# Patient Record
Sex: Male | Born: 1970 | Race: White | Hispanic: No | Marital: Married | Smoking: Current every day smoker
Health system: Southern US, Community
[De-identification: ages and names within clinical notes are randomized; demographics above are authoritative.]

## PROBLEM LIST (undated history)

## (undated) DIAGNOSIS — F1911 Other psychoactive substance abuse, in remission: Secondary | ICD-10-CM

## (undated) DIAGNOSIS — K219 Gastro-esophageal reflux disease without esophagitis: Secondary | ICD-10-CM

## (undated) DIAGNOSIS — I10 Essential (primary) hypertension: Secondary | ICD-10-CM

## (undated) DIAGNOSIS — F329 Major depressive disorder, single episode, unspecified: Secondary | ICD-10-CM

## (undated) DIAGNOSIS — B20 Human immunodeficiency virus [HIV] disease: Secondary | ICD-10-CM

## (undated) DIAGNOSIS — F32A Depression, unspecified: Secondary | ICD-10-CM

## (undated) DIAGNOSIS — Z21 Asymptomatic human immunodeficiency virus [HIV] infection status: Secondary | ICD-10-CM

## (undated) DIAGNOSIS — F319 Bipolar disorder, unspecified: Secondary | ICD-10-CM

## (undated) HISTORY — DX: Other psychoactive substance abuse, in remission: F19.11

## (undated) HISTORY — PX: FINGER SURGERY: SHX640

---

## 2004-11-26 ENCOUNTER — Ambulatory Visit: Payer: Self-pay | Admitting: Family Medicine

## 2004-12-13 ENCOUNTER — Ambulatory Visit: Payer: Self-pay | Admitting: Family Medicine

## 2005-08-01 ENCOUNTER — Ambulatory Visit: Payer: Self-pay | Admitting: Family Medicine

## 2005-08-21 ENCOUNTER — Ambulatory Visit: Payer: Self-pay | Admitting: Family Medicine

## 2005-12-16 ENCOUNTER — Ambulatory Visit: Payer: Self-pay | Admitting: Family Medicine

## 2006-01-08 ENCOUNTER — Ambulatory Visit: Payer: Self-pay | Admitting: Family Medicine

## 2006-09-18 ENCOUNTER — Ambulatory Visit: Payer: Self-pay | Admitting: Family Medicine

## 2006-12-26 ENCOUNTER — Ambulatory Visit: Payer: Self-pay | Admitting: Family Medicine

## 2012-01-03 ENCOUNTER — Encounter (HOSPITAL_COMMUNITY): Payer: Self-pay | Admitting: *Deleted

## 2012-01-03 ENCOUNTER — Inpatient Hospital Stay (HOSPITAL_COMMUNITY): Payer: Self-pay

## 2012-01-03 ENCOUNTER — Other Ambulatory Visit: Payer: Self-pay

## 2012-01-03 ENCOUNTER — Emergency Department (HOSPITAL_COMMUNITY): Payer: Self-pay

## 2012-01-03 ENCOUNTER — Inpatient Hospital Stay (HOSPITAL_COMMUNITY)
Admission: EM | Admit: 2012-01-03 | Discharge: 2012-01-05 | DRG: 683 | Disposition: A | Discharge status: Home / Self Care | Payer: Self-pay | Attending: Internal Medicine | Admitting: Internal Medicine

## 2012-01-03 DIAGNOSIS — I959 Hypotension, unspecified: Secondary | ICD-10-CM | POA: Diagnosis present

## 2012-01-03 DIAGNOSIS — E876 Hypokalemia: Secondary | ICD-10-CM | POA: Diagnosis present

## 2012-01-03 DIAGNOSIS — G2579 Other drug induced movement disorders: Secondary | ICD-10-CM

## 2012-01-03 DIAGNOSIS — F319 Bipolar disorder, unspecified: Secondary | ICD-10-CM

## 2012-01-03 DIAGNOSIS — N289 Disorder of kidney and ureter, unspecified: Secondary | ICD-10-CM

## 2012-01-03 DIAGNOSIS — F3130 Bipolar disorder, current episode depressed, mild or moderate severity, unspecified: Secondary | ICD-10-CM | POA: Diagnosis present

## 2012-01-03 DIAGNOSIS — E8779 Other fluid overload: Secondary | ICD-10-CM | POA: Diagnosis not present

## 2012-01-03 DIAGNOSIS — R112 Nausea with vomiting, unspecified: Secondary | ICD-10-CM | POA: Diagnosis present

## 2012-01-03 DIAGNOSIS — R21 Rash and other nonspecific skin eruption: Secondary | ICD-10-CM

## 2012-01-03 DIAGNOSIS — N179 Acute kidney failure, unspecified: Principal | ICD-10-CM | POA: Diagnosis present

## 2012-01-03 DIAGNOSIS — I10 Essential (primary) hypertension: Secondary | ICD-10-CM | POA: Diagnosis present

## 2012-01-03 DIAGNOSIS — L408 Other psoriasis: Secondary | ICD-10-CM | POA: Diagnosis present

## 2012-01-03 HISTORY — DX: Major depressive disorder, single episode, unspecified: F32.9

## 2012-01-03 HISTORY — DX: Bipolar disorder, unspecified: F31.9

## 2012-01-03 HISTORY — DX: Depression, unspecified: F32.A

## 2012-01-03 HISTORY — DX: Essential (primary) hypertension: I10

## 2012-01-03 LAB — CBC
Hemoglobin: 11 g/dL — ABNORMAL LOW (ref 13.0–17.0)
MCH: 31.4 pg (ref 26.0–34.0)
RBC: 3.5 MIL/uL — ABNORMAL LOW (ref 4.22–5.81)
WBC: 6.8 10*3/uL (ref 4.0–10.5)

## 2012-01-03 LAB — COMPREHENSIVE METABOLIC PANEL
ALT: 20 U/L (ref 0–53)
AST: 23 U/L (ref 0–37)
AST: 21 U/L (ref 0–37)
Albumin: 2.9 g/dL — ABNORMAL LOW (ref 3.5–5.2)
BUN: 25 mg/dL — ABNORMAL HIGH (ref 6–23)
Calcium: 7.1 mg/dL — ABNORMAL LOW (ref 8.4–10.5)
Chloride: 99 mEq/L (ref 96–112)
Creatinine, Ser: 2.46 mg/dL — ABNORMAL HIGH (ref 0.50–1.35)
Creatinine, Ser: 1.69 mg/dL — ABNORMAL HIGH (ref 0.50–1.35)
GFR calc Af Amer: 56 mL/min — ABNORMAL LOW (ref >90)
Potassium: 3.7 mEq/L (ref 3.5–5.1)
Sodium: 131 mEq/L — ABNORMAL LOW (ref 135–145)
Total Bilirubin: 0.4 mg/dL (ref 0.3–1.2)
Total Protein: 5.9 g/dL — ABNORMAL LOW (ref 6.0–8.3)
Total Protein: 5.2 g/dL — ABNORMAL LOW (ref 6.0–8.3)

## 2012-01-03 LAB — URINALYSIS, ROUTINE W REFLEX MICROSCOPIC
Glucose, UA: 100 mg/dL — AB
Leukocytes, UA: NEGATIVE
Protein, ur: 100 mg/dL — AB
Specific Gravity, Urine: >1.03 — ABNORMAL HIGH (ref 1.005–1.030)
pH: 5 (ref 5.0–8.0)

## 2012-01-03 LAB — BASIC METABOLIC PANEL
CO2: 19 mEq/L (ref 19–32)
Calcium: 7 mg/dL — ABNORMAL LOW (ref 8.4–10.5)
Chloride: 107 mEq/L (ref 96–112)
Creatinine, Ser: 1.38 mg/dL — ABNORMAL HIGH (ref 0.50–1.35)
GFR calc Af Amer: 72 mL/min — ABNORMAL LOW (ref >90)
Sodium: 134 mEq/L — ABNORMAL LOW (ref 135–145)

## 2012-01-03 LAB — DIFFERENTIAL
Basophils Absolute: 0 10*3/uL (ref 0.0–0.1)
Lymphocytes Relative: 4 % — ABNORMAL LOW (ref 12–46)
Monocytes Absolute: 0.6 10*3/uL (ref 0.1–1.0)
Monocytes Relative: 9 % (ref 3–12)
Neutro Abs: 5.7 10*3/uL (ref 1.7–7.7)
Neutrophils Relative %: 84 % — ABNORMAL HIGH (ref 43–77)

## 2012-01-03 LAB — URINE MICROSCOPIC-ADD ON

## 2012-01-03 LAB — RAPID URINE DRUG SCREEN, HOSP PERFORMED
Amphetamines: POSITIVE — AB
Benzodiazepines: POSITIVE — AB
Cocaine: NOT DETECTED
Opiates: NOT DETECTED
Tetrahydrocannabinol: NOT DETECTED

## 2012-01-03 LAB — CARDIAC PANEL(CRET KIN+CKTOT+MB+TROPI)
CK, MB: 1.3 ng/mL (ref 0.3–4.0)
Relative Index: INVALID (ref 0.0–2.5)
Troponin I: <0.3 ng/mL (ref <0.30)

## 2012-01-03 LAB — MRSA PCR SCREENING: MRSA by PCR: POSITIVE — AB

## 2012-01-03 LAB — CK: Total CK: 116 U/L (ref 7–232)

## 2012-01-03 MED ORDER — SODIUM CHLORIDE 0.9 % IV BOLUS (SEPSIS)
1000.0000 mL | Freq: Once | INTRAVENOUS | Status: AC
  Administered 2012-01-03: 1000 mL via INTRAVENOUS

## 2012-01-03 MED ORDER — FUROSEMIDE 10 MG/ML IJ SOLN
40.0000 mg | Freq: Two times a day (BID) | INTRAMUSCULAR | Status: DC
  Administered 2012-01-03 – 2012-01-05 (×4): 40 mg via INTRAVENOUS
  Filled 2012-01-03 (×3): qty 4

## 2012-01-03 MED ORDER — FUROSEMIDE 10 MG/ML IJ SOLN
INTRAMUSCULAR | Status: AC
  Filled 2012-01-03: qty 4

## 2012-01-03 MED ORDER — FENTANYL CITRATE 0.05 MG/ML IJ SOLN
INTRAMUSCULAR | Status: AC
  Filled 2012-01-03: qty 2

## 2012-01-03 MED ORDER — MORPHINE SULFATE 2 MG/ML IJ SOLN
2.0000 mg | INTRAMUSCULAR | Status: DC | PRN
  Administered 2012-01-03 (×2): 2 mg via INTRAVENOUS
  Filled 2012-01-03 (×2): qty 1

## 2012-01-03 MED ORDER — SODIUM CHLORIDE 0.9 % IV BOLUS (SEPSIS)
1000.0000 mL | Freq: Once | INTRAVENOUS | Status: AC
  Administered 2012-01-03 (×2): 1000 mL via INTRAVENOUS

## 2012-01-03 MED ORDER — SODIUM CHLORIDE 0.9 % IV BOLUS (SEPSIS): 1000.0000 mL | Freq: Once | INTRAVENOUS | Status: DC

## 2012-01-03 MED ORDER — LORAZEPAM 2 MG/ML IJ SOLN
INTRAMUSCULAR | Status: AC
  Administered 2012-01-03: 1 mg
  Filled 2012-01-03: qty 1

## 2012-01-03 MED ORDER — SODIUM CHLORIDE 0.9 % IV SOLN
INTRAVENOUS | Status: DC
  Administered 2012-01-03 (×2): via INTRAVENOUS

## 2012-01-03 MED ORDER — LORAZEPAM 2 MG/ML IJ SOLN
INTRAMUSCULAR | Status: AC
  Filled 2012-01-03: qty 1

## 2012-01-03 MED ORDER — HEPARIN SODIUM (PORCINE) 5000 UNIT/ML IJ SOLN
5000.0000 [IU] | Freq: Three times a day (TID) | INTRAMUSCULAR | Status: DC
  Administered 2012-01-03 – 2012-01-05 (×7): 5000 [IU] via SUBCUTANEOUS
  Filled 2012-01-03 (×7): qty 1

## 2012-01-03 MED ORDER — MUPIROCIN 2 % EX OINT
1.0000 "application " | TOPICAL_OINTMENT | Freq: Two times a day (BID) | CUTANEOUS | Status: DC
  Administered 2012-01-03 – 2012-01-04 (×4): 1 via NASAL
  Filled 2012-01-03: qty 22

## 2012-01-03 MED ORDER — LORAZEPAM 2 MG/ML IJ SOLN
INTRAMUSCULAR | Status: AC
  Administered 2012-01-03: 0.5 mg
  Filled 2012-01-03: qty 1

## 2012-01-03 MED ORDER — ONDANSETRON HCL 4 MG/2ML IJ SOLN
4.0000 mg | Freq: Once | INTRAMUSCULAR | Status: AC
  Administered 2012-01-04: 4 mg via INTRAVENOUS
  Filled 2012-01-03: qty 2

## 2012-01-03 MED ORDER — LORAZEPAM 2 MG/ML IJ SOLN
2.0000 mg | INTRAMUSCULAR | Status: DC | PRN
Start: 2012-01-03 — End: 2012-01-05
  Administered 2012-01-03 – 2012-01-05 (×15): 2 mg via INTRAVENOUS
  Filled 2012-01-03 (×13): qty 1

## 2012-01-03 MED ORDER — CHLORHEXIDINE GLUCONATE CLOTH 2 % EX PADS
6.0000 | MEDICATED_PAD | Freq: Every day | CUTANEOUS | Status: DC
  Administered 2012-01-04 – 2012-01-05 (×2): 6 via TOPICAL

## 2012-01-03 MED ORDER — LORAZEPAM 2 MG/ML IJ SOLN
INTRAMUSCULAR | Status: AC
  Administered 2012-01-03: 06:00:00
  Filled 2012-01-03: qty 1

## 2012-01-03 MED ORDER — LORAZEPAM 2 MG/ML IJ SOLN: 1.0000 mg | Freq: Once | INTRAMUSCULAR | Status: DC

## 2012-01-03 MED ORDER — SODIUM CHLORIDE 0.9 % IJ SOLN
3.0000 mL | Freq: Two times a day (BID) | INTRAMUSCULAR | Status: DC
  Administered 2012-01-03 – 2012-01-04 (×3): 3 mL via INTRAVENOUS
  Filled 2012-01-03: qty 6
  Filled 2012-01-03 (×4): qty 3

## 2012-01-03 MED ORDER — LORAZEPAM 2 MG/ML IJ SOLN: 0.5000 mg | Freq: Once | INTRAMUSCULAR | Status: DC

## 2012-01-03 MED ORDER — LORAZEPAM 2 MG/ML IJ SOLN
1.0000 mg | Freq: Once | INTRAMUSCULAR | Status: DC
  Filled 2012-01-03 (×2): qty 1

## 2012-01-03 MED ORDER — FENTANYL CITRATE 0.05 MG/ML IJ SOLN
25.0000 ug | Freq: Once | INTRAMUSCULAR | Status: AC
  Administered 2012-01-03: 25 ug via INTRAVENOUS

## 2012-01-03 MED ORDER — MORPHINE SULFATE 4 MG/ML IJ SOLN
4.0000 mg | INTRAMUSCULAR | Status: DC | PRN
  Administered 2012-01-03 – 2012-01-04 (×7): 4 mg via INTRAVENOUS
  Filled 2012-01-03 (×8): qty 1

## 2012-01-03 NOTE — ED Notes (Signed)
Report obtained, pt sitting up in bed, c/o pain all over, family member at bedside.  Need for temp explained and pt in agreement. Temp obtained w/o difficulty.  Pt repositioned self in bed. Awaiting admit.

## 2012-01-03 NOTE — ED Provider Notes (Signed)
History     CSN: [id number]  Arrival date & time 01/03/12  0348   First MD Initiated Contact with Patient 01/03/12 [phone]      Chief Complaint  Patient presents with  . Altered Mental Status    (Consider location/radiation/quality/duration/timing/severity/associated sxs/prior treatment) HPI Pt states that he restarted taking Levaquin for leg rash today and had increased lightheadedness, N/V, sharp R chest pain and pain in both arm for "biceps down". No fever chills, diarrhea. Chest pain is resolved. No other substance intake.  Past Medical History  Diagnosis Date  . Hypertension   . Bipolar disorder   . Depressed     Past Surgical History  Procedure Date  . Finger surgery     No family history on file.  History  Substance Use Topics  . Smoking status: Never Smoker   . Smokeless tobacco: Not on file  . Alcohol Use: No      Review of Systems  Constitutional: Negative for fever and chills.  HENT: Negative for neck pain.   Respiratory: Negative for shortness of breath.   Cardiovascular: Positive for chest pain. Negative for palpitations and leg swelling.  Gastrointestinal: Positive for nausea and vomiting. Negative for abdominal pain and diarrhea.  Musculoskeletal: Positive for myalgias.  Skin: Positive for color change and rash.  Neurological: Positive for dizziness and light-headedness. Negative for weakness, numbness and headaches.    Allergies  Sulfa antibiotics  Home Medications   Current Outpatient Rx  Name Route Sig Dispense Refill  . CITALOPRAM HYDROBROMIDE 40 MG PO TABS Oral Take 40 mg by mouth daily.    Marland Kitchen LEVOFLOXACIN 500 MG PO TABS Oral Take 500 mg by mouth daily.    Marland Kitchen LORAZEPAM 0.5 MG PO TABS Oral Take 0.5 mg by mouth 3 (three) times daily as needed.    Marland Kitchen METOPROLOL SUCCINATE ER 50 MG PO TB24 Oral Take 50 mg by mouth daily. Take with or immediately following a meal.    . QUETIAPINE FUMARATE 300 MG PO TABS Oral Take 300 mg by mouth at bedtime.       BP 95/50  Pulse 102  Temp(Src) 98.8 F (37.1 C) (Oral)  Resp 24  Ht 5\' 9"  (1.753 m)  Wt 185 lb (83.915 kg)  BMI 27.32 kg/m2  SpO2 99%  Physical Exam  Nursing note and vitals reviewed. Constitutional: He is oriented to person, place, and time. He appears well-developed and well-nourished. No distress.  HENT:  Head: Normocephalic and atraumatic.  Mouth/Throat: Oropharynx is clear and moist.  Eyes: EOM are normal. Pupils are equal, round, and reactive to light.  Neck: Normal range of motion. Neck supple.  Cardiovascular: Normal rate and regular rhythm.   Pulmonary/Chest: Effort normal and breath sounds normal. No respiratory distress. He has no wheezes. He has no rales.  Abdominal: Soft. Bowel sounds are normal. There is no tenderness. There is no rebound and no guarding.  Musculoskeletal: Normal range of motion. He exhibits no edema and no tenderness.  Neurological: He is alert and oriented to person, place, and time. He displays abnormal reflex (Hyperrelfexia). He exhibits abnormal muscle tone (Increased upper ext tone).       5/5 motor, sensation intact +fine tremor  Skin: Skin is warm and dry. No rash noted. There is erythema (Pt is flushed appearing throughout head and torso, patchy, erythematous rash bl lower ext).  Psychiatric: He has a normal mood and affect. His behavior is normal.    ED Course  Procedures (including critical care time)  Labs Reviewed  CBC - Abnormal; Notable for the following:    RBC 3.50 (*)    Hemoglobin 11.0 (*)    HCT 30.9 (*)    Platelets 126 (*)    All other components within normal limits  DIFFERENTIAL - Abnormal; Notable for the following:    Neutrophils Relative 84 (*)    Lymphocytes Relative 4 (*)    Lymphs Abs 0.2 (*)    All other components within normal limits  COMPREHENSIVE METABOLIC PANEL - Abnormal; Notable for the following:    Sodium 131 (*)    Glucose, Bld 121 (*)    BUN 25 (*)    Creatinine, Ser 2.46 (*)    Total  Protein 5.9 (*)    Albumin 2.9 (*)    GFR calc non Af Amer 31 (*)    GFR calc Af Amer 36 (*)    All other components within normal limits  URINALYSIS, ROUTINE W REFLEX MICROSCOPIC - Abnormal; Notable for the following:    Specific Gravity, Urine >1.030 (*)    Glucose, UA 100 (*)    Hgb urine dipstick SMALL (*)    Bilirubin Urine MODERATE (*)    Ketones, ur TRACE (*)    Protein, ur 100 (*)    All other components within normal limits  URINE RAPID DRUG SCREEN (HOSP PERFORMED) - Abnormal; Notable for the following:    Benzodiazepines POSITIVE (*)    Amphetamines POSITIVE (*)    All other components within normal limits  URINE MICROSCOPIC-ADD ON - Abnormal; Notable for the following:    Bacteria, UA MANY (*)    Casts HYALINE CASTS (*)    Crystals CA OXALATE CRYSTALS (*)    All other components within normal limits  CARDIAC PANEL(CRET KIN+CKTOT+MB+TROPI)  ETHANOL   No results found.   1. Serotonin syndrome   2. Renal insufficiency   3. Hypotension      Date: 01/03/2012  Rate:89  Rhythm: normal sinus rhythm  QRS Axis: normal  Intervals: normal  ST/T Wave abnormalities: normal  Conduction Disutrbances:none  Narrative Interpretation:   Old EKG Reviewed: none available    MDM  Suspect serotonin syndrome vs drug abuse.         Loren Racer, MD 01/03/12 [phone]

## 2012-01-03 NOTE — ED Notes (Signed)
Report called to icu. Pt transported

## 2012-01-03 NOTE — ED Notes (Signed)
Upon initial exam, pt very flushed all over, pt reports to EDP that he recently took levaquin for rash on his legs, pt now c/o of cramping to both arms and legs, EDP to give further orders

## 2012-01-03 NOTE — Progress Notes (Signed)
Pt's pain level is markedly improved. Pt rates pain at a 5/10. Pt is able to sleep with the help of ativan. Pt has developed tremors activity that is also relieved by ativan.

## 2012-01-03 NOTE — Progress Notes (Signed)
UR Chart Review Completed  

## 2012-01-03 NOTE — ED Notes (Signed)
Pt arrived by POV very lethargic, pt has multiple c/o, chest pain, arm pain, nausea/vomiting and rash to both legs, pt does report he took 2 of his seroquel and some ativan before going to sleep

## 2012-01-03 NOTE — ED Notes (Signed)
Pt currently getting blood draw by lab

## 2012-01-03 NOTE — Progress Notes (Signed)
MRSA screen called back as positive. MD notified. MRSA protocol orders initiated.

## 2012-01-03 NOTE — ED Notes (Signed)
Orthostatic v/s held at this time d/t pt's body cramping, EDP aware

## 2012-01-03 NOTE — ED Notes (Signed)
Patient C/O of pain in his fingers. MD notified, no new orders given at this time.

## 2012-01-03 NOTE — H&P (Signed)
Ryan [name] MRN: [id number] DOB/AGE: 11/15/1970 41 y.o. Primary Care Physician:Ryan [name], MD, MD Admit date: 01/03/2012 Chief Complaint: Hypotension, acute renal failure. HPI: This 41 year old man presented with the above signs and abnormal investigations. He had been started on Levaquin for some sort of right leg rash approximately 2 weeks ago. He now has a rash all over his trunk and seems to be very sensitive and in pain all over his body. He cannot get comfortable. He denies taking an overdose of anything. He does have bipolar disorder and takes SSRI and Seroquel. Urine drug screen is positive for amphetamines. He said he has been using Afrin nasal spray. He denies any nausea or vomiting. He has no previous history of renal failure. He did have some chest pain but I think this was a atypical generalized chest pain and his electrocardiogram was within normal limits. His chest pain seems to have resolved at this point in time. He is alert and responds appropriately. There was question of altered mental status earlier, however.  Past Medical History  Diagnosis Date  . Hypertension   . Bipolar disorder   . Depressed    Past Surgical History  Procedure Date  . Finger surgery           Social History: He is divorced and lives alone. He does not smoke, he does not drink alcohol. He works as a Nutritional therapist.   Allergies:  Allergies  Allergen Reactions  . Sulfa Antibiotics     Medications Prior to Admission  Medication Dose Route Frequency Provider Last Rate Last Dose  . 0.9 %  sodium chloride infusion   Intravenous Continuous Ryan Baar C Dracen Reigle, MD      . fentaNYL (SUBLIMAZE) injection 25 mcg  25 mcg Intravenous Once Ryan Racer, MD   25 mcg at 01/03/12 0647  . heparin injection 5,000 Units  5,000 Units Subcutaneous Q8H Ryan Mickel C Garyn Arlotta, MD      . LORazepam (ATIVAN) 2 MG/ML injection        0.5 mg at 01/03/12 0454  . LORazepam (ATIVAN) 2 MG/ML injection        1 mg at  01/03/12 0515  . LORazepam (ATIVAN) 2 MG/ML injection           . LORazepam (ATIVAN) 2 MG/ML injection           . LORazepam (ATIVAN) injection 0.5 mg  0.5 mg Intravenous Once Ryan Racer, MD      . LORazepam (ATIVAN) injection 1 mg  1 mg Intravenous Once Ryan Racer, MD      . LORazepam (ATIVAN) injection 1 mg  1 mg Intravenous Once Ryan Racer, MD      . LORazepam (ATIVAN) injection 2 mg  2 mg Intravenous Q2H PRN Ryan Liwanag Normajean Glasgow, MD      . morphine 2 MG/ML injection 2 mg  2 mg Intravenous Q3H PRN Ryan Haque Normajean Glasgow, MD      . ondansetron (ZOFRAN) injection 4 mg  4 mg Intravenous Once Ryan Racer, MD      . sodium chloride 0.9 % bolus 1,000 mL  1,000 mL Intravenous Once Ryan Racer, MD   1,000 mL at 01/03/12 0443  . sodium chloride 0.9 % bolus 1,000 mL  1,000 mL Intravenous Once Ryan Racer, MD   1,000 mL at 01/03/12 0517  . sodium chloride 0.9 % bolus 1,000 mL  1,000 mL Intravenous Once Ryan Racer, MD   1,000 mL at 01/03/12 0641  . sodium chloride 0.9 % bolus  1,000 mL  1,000 mL Intravenous Once Ryan Racer, MD      . sodium chloride 0.9 % injection 3 mL  3 mL Intravenous Q12H Ryan Ose Normajean Glasgow, MD       Medications Prior to Admission  Medication Sig Dispense Refill  . citalopram (CELEXA) 40 MG tablet Take 40 mg by mouth daily.      . metoprolol succinate (TOPROL-XL) 50 MG 24 hr tablet Take 50 mg by mouth daily. Take with or immediately following a meal.      . QUEtiapine (SEROQUEL) 300 MG tablet Take 300 mg by mouth at bedtime.           ZOX:WRUEA from the symptoms mentioned above,there are no other symptoms referable to all systems reviewed.  Physical Exam: Blood pressure 95/50, pulse 102, temperature 100.8 F (38.2 C), temperature source Rectal, resp. rate 24, height 5\' 9"  (1.753 m), weight 83.915 kg (185 lb), SpO2 99.00%. He appears to be writhing in pain. However, he is alert and completely orientated. He is hypotensive. Heart sounds are present and  normal. Lung fields are clear. Abdomen is soft but he will not let me examine further as he seems to be very sensitive to touch superficially all over his body. He does not really appear to have clinical features of meningitis he does not have a high fever and he does not look septic. His highest recorded temperature in the emergency room was 100.8. He has a macular/papular rash all over his trunk.    Basename 01/03/12 0416  WBC 6.8  NEUTROABS 5.7  HGB 11.0*  HCT 30.9*  MCV 88.3  PLT 126*    Basename 01/03/12 0416  NA 131*  K 3.7  CL 99  CO2 21  GLUCOSE 121*  BUN 25*  CREATININE 2.46*  CALCIUM 8.6  MG --         No results found. Impression: 1. Hypotension. 2. Acute renal failure. Secondary to 1. 3. Macular papular rash on his trunk. 4. History of hypertension. 5. History of bipolar disorder.  His overall picture is not entirely clear. He does not really have clinical features of serotonin syndrome.   Plan: 1. Admit to step down unit. 2. Aggressive IV fluids. 3. Monitor renal function closely. Check CPK. Check lactic acid levels. 4. Supportive measures for symptom relief, including the use of benzodiazepines and opioids for pain relief.      Ryan [name] Pager [phone]  01/03/2012, 7:37 AM

## 2012-01-03 NOTE — Progress Notes (Signed)
Pt's mother noted new cough. Lungs auscultated and new onset ronchi noted. MD made aware and new orders given.

## 2012-01-03 NOTE — ED Notes (Signed)
EDP inquiring about pt's current temp, pt currently drinking water, EDP advises to avoid rectal temp d/t increased muscular stimulation

## 2012-01-04 ENCOUNTER — Inpatient Hospital Stay (HOSPITAL_COMMUNITY): Payer: Self-pay

## 2012-01-04 LAB — COMPREHENSIVE METABOLIC PANEL
ALT: 21 U/L (ref 0–53)
AST: 26 U/L (ref 0–37)
Alkaline Phosphatase: 43 U/L (ref 39–117)
Calcium: 7.2 mg/dL — ABNORMAL LOW (ref 8.4–10.5)
GFR calc Af Amer: 84 mL/min — ABNORMAL LOW (ref >90)
Glucose, Bld: 91 mg/dL (ref 70–99)
Potassium: 3.2 mEq/L — ABNORMAL LOW (ref 3.5–5.1)
Sodium: 130 mEq/L — ABNORMAL LOW (ref 135–145)
Total Protein: 6.1 g/dL (ref 6.0–8.3)

## 2012-01-04 LAB — CBC
HCT: 32.2 % — ABNORMAL LOW (ref 39.0–52.0)
Hemoglobin: 11.2 g/dL — ABNORMAL LOW (ref 13.0–17.0)
MCH: 31 pg (ref 26.0–34.0)
MCHC: 34.8 g/dL (ref 30.0–36.0)
MCV: 89.2 fL (ref 78.0–100.0)
RDW: 13.4 % (ref 11.5–15.5)

## 2012-01-04 MED ORDER — POTASSIUM CHLORIDE CRYS ER 20 MEQ PO TBCR
40.0000 meq | EXTENDED_RELEASE_TABLET | Freq: Once | ORAL | Status: AC
Start: 2012-01-04 — End: 2012-01-04
  Administered 2012-01-04: 40 meq via ORAL
  Filled 2012-01-04: qty 2

## 2012-01-04 MED ORDER — SODIUM CHLORIDE 0.9 % IJ SOLN
INTRAMUSCULAR | Status: AC
  Filled 2012-01-04: qty 3

## 2012-01-04 MED ORDER — PROMETHAZINE HCL 25 MG/ML IJ SOLN
25.0000 mg | Freq: Once | INTRAMUSCULAR | Status: AC
  Administered 2012-01-04: 25 mg via INTRAVENOUS
  Filled 2012-01-04: qty 1

## 2012-01-04 MED ORDER — MORPHINE SULFATE 2 MG/ML IJ SOLN
2.0000 mg | INTRAMUSCULAR | Status: DC | PRN
  Administered 2012-01-05: 2 mg via INTRAVENOUS
  Filled 2012-01-04: qty 1

## 2012-01-04 MED ORDER — ONDANSETRON HCL 4 MG/2ML IJ SOLN
4.0000 mg | Freq: Four times a day (QID) | INTRAMUSCULAR | Status: DC | PRN
  Administered 2012-01-04 – 2012-01-05 (×3): 4 mg via INTRAVENOUS
  Filled 2012-01-04 (×2): qty 2

## 2012-01-04 MED ORDER — METHYLPREDNISOLONE SODIUM SUCC 125 MG IJ SOLR
125.0000 mg | Freq: Once | INTRAMUSCULAR | Status: AC
  Administered 2012-01-04: 125 mg via INTRAVENOUS
  Filled 2012-01-04: qty 2

## 2012-01-04 MED ORDER — ONDANSETRON HCL 4 MG/2ML IJ SOLN
INTRAMUSCULAR | Status: AC
  Filled 2012-01-04: qty 2

## 2012-01-04 NOTE — Progress Notes (Signed)
Subjective: This man is feeling better now. His pain is much improved. He did have fluid overload yesterday for which she has been treated with intravenous diuretics. His oxygen requirement has decreased now to 2 L per minute of oxygen.           Physical Exam: Blood pressure 116/69, pulse 108, temperature 98.5 F (36.9 C), temperature source Oral, resp. rate 28, height 5\' 9"  (1.753 m), weight 90.9 kg (200 lb 6.4 oz), SpO2 99.00%. He looks systemically well. He is not in any obvious pain at the present time. The rash on his trunk is disappeared. He does have some joint swelling around his wrists but also his hands are swollen, this might reflect fluid overload I suspect. He is alert and orientated.   Investigations:  Recent Results (from the past 240 hour(s))  MRSA PCR SCREENING     Status: Abnormal   Collection Time   01/03/12  9:17 AM      Component Value Range Status Comment   MRSA by PCR POSITIVE (*) NEGATIVE  Final      Basic Metabolic Panel:  Basename 01/04/12 0517 01/03/12 1510  NA 130* 134*  K 3.2* 3.8  CL 99 107  CO2 23 19  GLUCOSE 91 92  BUN 16 18  CREATININE 1.22 1.38*  CALCIUM 7.2* 7.0*  MG -- --  PHOS -- --   Liver Function Tests:  [facility] 01/04/12 0517 01/03/12 0811  AST 26 21  ALT 21 20  ALKPHOS 43 40  BILITOT 0.4 0.3  PROT 6.1 5.2*  ALBUMIN 2.7* 2.4*     CBC:  Basename 01/04/12 0517 01/03/12 0416  WBC 8.2 6.8  NEUTROABS -- 5.7  HGB 11.2* 11.0*  HCT 32.2* 30.9*  MCV 89.2 88.3  PLT 125* 126*    Dg Chest Port 1 View  01/03/2012  *RADIOLOGY REPORT*  Clinical Data: Hypoxia, fluid overload.  PORTABLE CHEST - 1 VIEW  Comparison: None  Findings: Diffuse bilateral airspace opacities are noted, with confluent area in the right upper lobe.  Heart is borderline in size.  No effusions.  No acute bony abnormality.  IMPRESSION: Bilateral airspace opacities, most confluent in the right upper lobe.  Asymmetric edema versus infection.  Original Report  Authenticated By: Cyndie Chime, M.D.      Medications: I have reviewed the patient's current medications.  Impression: 1. Hypotension, resolved. 2. Acute renal failure, resolved. 3. Rash, resolved. 4. Fluid overload, improving. 5. Bipolar disorder, stable. 6. Hypokalemia.     Plan: 1. Replete potassium. 2. One dose of IV Solu-Medrol in case there is an inflammatory component to his illness. 3. Wean patient off oxygen. 4. Consider discharge home soon.     LOS: 1 day   Wilson Singer Pager [phone]  01/04/2012, 7:46 AM

## 2012-01-04 NOTE — Progress Notes (Signed)
Pt. Transferred to room 332 via wheelchair accompanied by RN. Report given to Quita Skye, RN. Patient continues to have nausea and vomiting, other vital signs stable.

## 2012-01-05 LAB — CBC
Platelets: 148 10*3/uL — ABNORMAL LOW (ref 150–400)
RBC: 4.05 MIL/uL — ABNORMAL LOW (ref 4.22–5.81)
RDW: 13.2 % (ref 11.5–15.5)
WBC: 6.5 10*3/uL (ref 4.0–10.5)

## 2012-01-05 LAB — BASIC METABOLIC PANEL
CO2: 26 mEq/L (ref 19–32)
Calcium: 8.5 mg/dL (ref 8.4–10.5)
Chloride: 96 mEq/L (ref 96–112)
GFR calc Af Amer: >90 mL/min (ref >90)
Sodium: 133 mEq/L — ABNORMAL LOW (ref 135–145)

## 2012-01-05 MED ORDER — POTASSIUM CHLORIDE CRYS ER 20 MEQ PO TBCR
40.0000 meq | EXTENDED_RELEASE_TABLET | Freq: Once | ORAL | Status: AC
  Administered 2012-01-05: 40 meq via ORAL
  Filled 2012-01-05: qty 2

## 2012-01-05 NOTE — Progress Notes (Signed)
[facility] SURGICAL UNIT [address] [place] [address]  January 05, 2012  Patient: Ryan [name]  Date of Birth: 1971/09/09  Date of Visit: 01/03/2012    To Whom It May Concern:  Larence Thone was seen and treated in our hospital on 01/03/2012 and discharged on 01/05/12. SAMNANG SHUGARS  Can return to work on April 2nd,2013.Marland Kitchen  Sincerely,

## 2012-01-05 NOTE — Progress Notes (Signed)
Patient was given Zofran before being transferred to the floor. Patient was still have lots of nausea and vomiting up green emesis. Doctor was notified and new orders given for a one time dose of phenergan. Will continue to monitor patient.

## 2012-01-05 NOTE — Progress Notes (Addendum)
Patient has been waking up with severe tremors and confused about his surrounding and what he needs to be doing. Patient has been getting Ativan every two hours, while helps patient sleep and stops the tremors until close to the next time that Ativan can be given again. Will continue to monitor patient and make day nurse aware.

## 2012-01-05 NOTE — Progress Notes (Signed)
Discharge instructions reviewed with patient and patient's mother and sister. Pt. Given note from Dr. Karilyn Cota to return to work 01/07/12. Patient given information on how to contact [facility] for health benefits. Pt. Escorted by NT via wheelchair.

## 2012-01-05 NOTE — Discharge Summary (Signed)
Physician Discharge Summary  Patient ID: Ryan [name] MRN: [id number] DOB/AGE: 1971/02/25 41 y.o. Primary Care Physician:NYLAND,LEONARD Ryan Maduro, MD, Ryan [name] Admit date: 01/03/2012 Discharge date: 01/05/2012    Discharge Diagnoses:  1. Hypotension and acute renal failure, resolved, secondary to dehydration. 2. Amphetamines in urine drug screen, unclear significance. Patient denies use of illicit drugs. Patient uses Afrin nasal spray. 3. Hypertension. 4. Bipolar disorder. 5. Probable Psoriasis affecting arms and legs. 6. Fluid overload, resolved with the use of diuretics.   Medication List  As of 01/05/2012  9:54 AM   STOP taking these medications         LEVAQUIN 500 MG tablet         TAKE these medications         citalopram 40 MG tablet   Commonly known as: CELEXA   Take 40 mg by mouth daily.      LORazepam 0.5 MG tablet   Commonly known as: ATIVAN   Take 0.5 mg by mouth 3 (three) times daily as needed.      metoprolol succinate 50 MG 24 hr tablet   Commonly known as: TOPROL-XL   Take 50 mg by mouth daily. Take with or immediately following a meal.      QUEtiapine 300 MG tablet   Commonly known as: SEROQUEL   Take 300 mg by mouth at bedtime.            Discharged Condition: Stable and improved.    Consults: None.  Significant Diagnostic Studies: Dg Chest Port 1 View  01/04/2012  *RADIOLOGY REPORT*  Clinical Data: Pulmonary edema reassessment.  PORTABLE CHEST - 1 VIEW  Comparison: 01/03/2012.  Findings: Apical lordotic projection.  Improving pulmonary aeration.  Residual airspace disease is present with pulmonary vascular congestion and more focal airspace disease in the right upper lobe.  The asymmetric pulmonary edema versus infection are primary considerations.  The cardiopericardial silhouette is unchanged.  IMPRESSION: Improving pulmonary aeration most compatible with improving pulmonary edema.  More focal density in the right upper lobe may represent  superimposed pneumonia or more likely, asymmetric/atypical pulmonary edema.  Original Report Authenticated By: Andreas Newport, M.D.   Dg Chest Port 1 View  01/03/2012  *RADIOLOGY REPORT*  Clinical Data: Hypoxia, fluid overload.  PORTABLE CHEST - 1 VIEW  Comparison: None  Findings: Diffuse bilateral airspace opacities are noted, with confluent area in the right upper lobe.  Heart is borderline in size.  No effusions.  No acute bony abnormality.  IMPRESSION: Bilateral airspace opacities, most confluent in the right upper lobe.  Asymmetric edema versus infection.  Original Report Authenticated By: Cyndie Chime, M.D.    Lab Results: Basic Metabolic Panel:  Basename 01/05/12 0442 01/04/12 0517  NA 133* 130*  K 3.3* 3.2*  CL 96 99  CO2 26 23  GLUCOSE 122* 91  BUN 19 16  CREATININE 1.01 1.22  CALCIUM 8.5 7.2*  MG -- --  PHOS -- --   Liver Function Tests:  [facility] 01/04/12 0517 01/03/12 0811  AST 26 21  ALT 21 20  ALKPHOS 43 40  BILITOT 0.4 0.3  PROT 6.1 5.2*  ALBUMIN 2.7* 2.4*     CBC:  Basename 01/05/12 0442 01/04/12 0517 01/03/12 0416  WBC 6.5 8.2 --  NEUTROABS -- -- 5.7  HGB 12.5* 11.2* --  HCT 35.7* 32.2* --  MCV 88.1 89.2 --  PLT 148* 125* --    Recent Results (from the past 240 hour(s))  MRSA PCR SCREENING  Status: Abnormal   Collection Time   01/03/12  9:17 AM      Component Value Range Status Comment   MRSA by PCR POSITIVE (*) NEGATIVE  Final      Hospital Course: This 41 year old man presented to the hospital with symptoms of pain/extreme sensitivity all over his body to the point where he could not get comfortable, associated with hypotension and acute renal failure with creatinine 2.46. When he was seen in the emergency room, he had a rash all over his trunk, consistent with the antibiotic rash. The etiology of exquisite sensitivity in his trunk it was not entirely clear but this resolved prior to discharge. There were episodes of confusion with this  patient but he is much clearer this morning. The nursing staff felt that he may be withdrawing from something. He denies drinking excessive alcohol or any illicit drugs. He does admit to using alcohol and drugs many years ago. His renal function is now normal. He wishes to go home. He was concerned about the rash on his elbow areas and his lower legs. This was initially diagnosed as cellulitis. It clearly looks like psoriasis.  Discharge Exam: Blood pressure 115/75, pulse 94, temperature 98.2 F (36.8 C), temperature source Oral, resp. rate 20, height 5\' 9"  (1.753 m), weight 90.9 kg (200 lb 6.4 oz), SpO2 94.00%. He looks systemically well. He is alert and orientated without any focal neurological signs. He does not appear to be delirious. Heart sounds are present and normal. Lung fields are clear. Abdomen is soft and nontender. He does not give me a picture of withdrawal from alcohol or any other substance at the present time.  Disposition: Home. He will follow with his primary care physician. He would benefit from treatment of his psoriasis or at least referral to a dermatologist.  Discharge Orders    Future Orders Please Complete By Expires   Diet - low sodium heart healthy      Increase activity slowly         Follow-up Information    Follow up with Josue Hector, Ryan [name] .         SignedWilson Singer Pager [phone]  01/05/2012, 9:54 AM

## 2012-01-09 ENCOUNTER — Encounter (HOSPITAL_COMMUNITY): Payer: Self-pay | Admitting: *Deleted

## 2012-01-09 ENCOUNTER — Emergency Department (HOSPITAL_COMMUNITY)
Admission: EM | Admit: 2012-01-09 | Discharge: 2012-01-09 | Disposition: A | Discharge status: Home / Self Care | Payer: Self-pay | Attending: Emergency Medicine | Admitting: Emergency Medicine

## 2012-01-09 ENCOUNTER — Emergency Department (HOSPITAL_COMMUNITY): Payer: Self-pay

## 2012-01-09 DIAGNOSIS — G5600 Carpal tunnel syndrome, unspecified upper limb: Secondary | ICD-10-CM | POA: Insufficient documentation

## 2012-01-09 DIAGNOSIS — M4802 Spinal stenosis, cervical region: Secondary | ICD-10-CM | POA: Insufficient documentation

## 2012-01-09 DIAGNOSIS — M5412 Radiculopathy, cervical region: Secondary | ICD-10-CM | POA: Insufficient documentation

## 2012-01-09 DIAGNOSIS — M47812 Spondylosis without myelopathy or radiculopathy, cervical region: Secondary | ICD-10-CM | POA: Insufficient documentation

## 2012-01-09 DIAGNOSIS — I10 Essential (primary) hypertension: Secondary | ICD-10-CM | POA: Insufficient documentation

## 2012-01-09 DIAGNOSIS — F319 Bipolar disorder, unspecified: Secondary | ICD-10-CM | POA: Insufficient documentation

## 2012-01-09 DIAGNOSIS — M5030 Other cervical disc degeneration, unspecified cervical region: Secondary | ICD-10-CM | POA: Insufficient documentation

## 2012-01-09 DIAGNOSIS — M792 Neuralgia and neuritis, unspecified: Secondary | ICD-10-CM

## 2012-01-09 LAB — BASIC METABOLIC PANEL
CO2: 26 mEq/L (ref 19–32)
GFR calc non Af Amer: >90 mL/min (ref >90)
Glucose, Bld: 105 mg/dL — ABNORMAL HIGH (ref 70–99)
Potassium: 3.8 mEq/L (ref 3.5–5.1)
Sodium: 135 mEq/L (ref 135–145)

## 2012-01-09 MED ORDER — METHYLPREDNISOLONE 4 MG PO KIT: PACK | ORAL | Status: AC

## 2012-01-09 NOTE — ED Notes (Addendum)
Seen here 1 week ago and adm to ICU with problem with low bp and pain in hands, related to medication.  Now says he is still weak and needs follow up  Pt says reaction was to levaquin with another medication he was taking

## 2012-01-09 NOTE — ED Provider Notes (Signed)
History   This chart was scribed for Felisa Bonier, MD by Sofie Rower. The patient was seen in room [facility]18[facility] and the patient's care was started at 4:18 PM     CSN: [id number]  Arrival date & time 01/09/12  1444   First MD Initiated Contact with Patient 01/09/12 1517      Chief Complaint  Patient presents with  . Weakness    (Consider location/radiation/quality/duration/timing/severity/associated sxs/prior treatment) HPI  Ryan [name] is a 41 y.o. male who presents to the Emergency Department complaining of moderate, intermittent weakness located in both hands onset seven days ago with associated symptoms of radiating pain bilaterally in both arms, numbness in the fingers of the right hand, weakness bilaterally in the legs, diarrhea. Pt states "he was here last Thursday night, had a horrific pain, a burning pain, bilaterally in both arms, and had a 68/33 blood pressure." Pt states the pain, dysesthesia, is a 3/10 at present". Pt informs EDP that "pain became worse and worse, he was admitted to ICU." Pt was given a "cream for his legs and instructed to make a follow up appointment with Dr. Lysbeth Galas, diagnosed with serotonin syndrome." Modifying factors include toprolol, seroquel, celexa with moderate relief. Pt has a hx of carpel tunnel syndrome  Pt denies rash, follow up with neurologist, neck pain, neck injury, imagining of neck,   Pt works in the [employer].   PCP is Dr. Lysbeth Galas.    Past Medical History  Diagnosis Date  . Hypertension   . Bipolar disorder   . Depressed     Past Surgical History  Procedure Date  . Finger surgery       History  Substance Use Topics  . Smoking status: Never Smoker   . Smokeless tobacco: Not on file  . Alcohol Use: No      Review of Systems  All other systems reviewed and are negative.    10 Systems reviewed and all are negative for acute change except as noted in the HPI.    Allergies  Sulfa antibiotics  Home  Medications   Current Outpatient Rx  Name Route Sig Dispense Refill  . CITALOPRAM HYDROBROMIDE 40 MG PO TABS Oral Take 40 mg by mouth daily.    Marland Kitchen LORAZEPAM 0.5 MG PO TABS Oral Take 0.5 mg by mouth 3 (three) times daily as needed.    Marland Kitchen METOPROLOL SUCCINATE ER 50 MG PO TB24 Oral Take 50 mg by mouth daily. Take with or immediately following a meal.    . QUETIAPINE FUMARATE 300 MG PO TABS Oral Take 300 mg by mouth at bedtime.      BP 140/84  Pulse 103  Temp(Src) 97.8 F (36.6 C) (Oral)  Resp 20  Ht 5\' 9"  (1.753 m)  Wt 180 lb (81.647 kg)  BMI 26.58 kg/m2  SpO2 100%  Physical Exam  Nursing note and vitals reviewed. Constitutional: He is oriented to person, place, and time. He appears well-developed and well-nourished.  HENT:  Head: Normocephalic and atraumatic.  Nose: Nose normal.  Eyes: Conjunctivae and EOM are normal. Pupils are equal, round, and reactive to light. No scleral icterus.  Neck: Normal range of motion. Neck supple. No thyromegaly present.  Cardiovascular: Normal rate, regular rhythm and normal heart sounds.  Exam reveals no gallop and no friction rub.   No murmur heard. Pulmonary/Chest: Effort normal and breath sounds normal. No stridor. He has no wheezes. He has no rales. He exhibits no tenderness.  Abdominal: He exhibits no  distension. There is no tenderness. There is no rebound.  Musculoskeletal: Normal range of motion. He exhibits no edema and no tenderness.       + tinell sign on right wrist. Bony joints of elbow and wrist seem normal without any instability. Normal strength 5/5 adduction, abduction of shoulders. Normal strength of flexion and extension of upper extremities. Grip strength bilaterally 4+/5. Strength at wrist, extension and flexion 5/5.  Lymphadenopathy:    He has no cervical adenopathy.  Neurological: He is alert and oriented to person, place, and time. He has normal reflexes. Coordination normal.       Finger nose finger test normal.    Skin: Rash  (Right shin multiple patches some confluent erythemitius, indurated, macular pattern with silvery scale compatible with soriasis. ) noted. No erythema.  Psychiatric: He has a normal mood and affect. His behavior is normal.    ED Course  Procedures (including critical care time)  DIAGNOSTIC STUDIES: Oxygen Saturation is 100% on room air, normal by my interpretation.    COORDINATION OF CARE:    Labs Reviewed - No data to display No results found.   No diagnosis found.   4:40PM- EDP at bedside discusses treatment plan, neck x-rays, and follow up with a neurologist.   MDM  The patient presents with bilateral upper extremity weakness of grip strength, and dysesthesia of the bilateral forearms distally through the bilateral hand and fingers. 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 I do not think that this is the entire cause of his symptoms however. His symptoms of decreased grip strength and dysesthesia are new and increased over the last week. He has had no injury to the cervical spine. He has had no distal numbness of the perineum or incontinence of bowel and bladder, or weakness of the lower extremities, nor difficulty with gait, and has no ataxia on examination. He denies any headaches. I performed a CT scan imaging of his cervical spine to evaluate for spinal stenosis and spondylosis causing spinal cord compression and/or radiculopathy. 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 With this being symptomatic, despite not having any neck pain, I will prescribe him corticosteroid to decrease any inflammation and the nerves, as I perceive his symptoms and findings to be due to spondylosis and  radicular compression/neuropathy. I will refer him to neurosurgery for further evaluation and treatment.   I personally performed the services described in this documentation, which was scribed in my presence. The recorded information has been reviewed and considered.   Felisa Bonier, MD 01/09/12 [phone]

## 2012-01-09 NOTE — ED Notes (Signed)
Pt states that he does not wish to stay for the results, is requesting referral to neurologist and to be discharged. Dr. Doylene Canard notified, advised that he would need the blood test for discharge. Pt updated.

## 2012-01-09 NOTE — ED Notes (Signed)
MD at bedside. 

## 2012-01-09 NOTE — Discharge Instructions (Signed)
Carpal Tunnel Syndrome The carpal tunnel is a narrow hollow area in the wrist. It is formed by the wrist bones and ligaments. Nerves, blood vessels, and tendons (cord like structures which attach muscle to bone) on the palm side (the side of your hand in the direction your fingers bend) of your hand pass through the carpal tunnel. Repeated wrist motion or certain diseases may cause swelling within the tunnel. (That is why these are called repetitive trauma (damage caused by over use) disorders. It is also a common problem in late pregnancy.) This swelling pinches the main nerve in the wrist (median nerve) and causes the painful condition called carpal tunnel syndrome. A feeling of "pins and needles" may be noticed in the fingers or hand; however, the entire arm may ache from this condition. Carpal tunnel syndrome may clear up by itself. Cortisone injections may help. Sometimes, an operation may be needed to free the pinched nerve. An electromyogram (a type of test) may be needed to confirm this diagnosis (learning what is wrong). This is a test which measures nerve conduction. The nerve conduction is usually slowed in a carpal tunnel syndrome. HOME CARE INSTRUCTIONS   If your caregiver prescribed medication to help reduce swelling, take as directed.   If you were given a splint to keep your wrist from bending, use it as instructed. It is important to wear the splint at night. Use the splint for as long as you have pain or numbness in your hand, arm or wrist. This may take 1 to 2 months.   If you have pain at night, it may help to rub or shake your hand, or elevate your hand above the level of your heart (the center of your chest).   It is important to give your wrist a rest by stopping the activities that are causing the problem. If your symptoms (problems) are work-related, you may need to talk to your employer about changing to a job that does not require using your wrist.   Only take over-the-counter  or prescription medicines for pain, discomfort, or fever as directed by your caregiver.   Following periods of extended use, particularly strenuous use, apply an ice pack wrapped in a towel to the anterior (palm) side of the affected wrist for 20 to 30 minutes. Repeat as needed three to four times per day. This will help reduce the swelling.   Follow all instructions for follow-up with your caregiver. This includes any orthopedic referrals, physical therapy, and rehabilitation. Any delay in obtaining necessary care could result in a delay or failure of your condition to heal.  SEEK IMMEDIATE MEDICAL CARE IF:   You are still having pain and numbness following a week of treatment.   You develop new, unexplained symptoms.   Your current symptoms are getting worse and are not helped or controlled with medications.  MAKE SURE YOU:   Understand these instructions.   Will watch your condition.   Will get help right away if you are not doing well or get worse.  Document Released: 09/20/2000 Document Revised: 09/12/2011 Document Reviewed: 08/09/2011 [facility] Patient Information 2012 [place], [place].Cervical Radiculopathy Cervical radiculopathy happens when a nerve in the neck is pinched or bruised by a slipped (herniated) disk or by arthritic changes in the bones of the cervical spine. This can occur due to an injury or as part of the normal aging process. Pressure on the cervical nerves can cause pain or numbness that runs from your neck all the way down  into your arm and fingers. CAUSES  There are many possible causes, including:  Injury.   Muscle tightness in the neck from overuse.   Swollen, painful joints (arthritis).   Breakdown or degeneration in the bones and joints of the spine (spondylosis) due to aging.   Bone spurs that may develop near the cervical nerves.  SYMPTOMS  Symptoms include pain, weakness, or numbness in the affected arm and hand. Pain can be severe or irritating.  Symptoms may be worse when extending or turning the neck. DIAGNOSIS  Your caregiver will ask about your symptoms and do a physical exam. He or she may test your strength and reflexes. X-rays, CT scans, and MRI scans may be needed in cases of injury or if the symptoms do not go away after a period of time. Electromyography (EMG) or nerve conduction testing may be done to study how your nerves and muscles are working. TREATMENT  Your caregiver may recommend certain exercises to help relieve your symptoms. Cervical radiculopathy can, and often does, get better with time and treatment. If your problems continue, treatment options may include:  Wearing a soft collar for short periods of time.   Physical therapy to strengthen the neck muscles.   Medicines, such as nonsteroidal anti-inflammatory drugs (NSAIDs), oral corticosteroids, or spinal injections.   Surgery. Different types of surgery may be done depending on the cause of your problems.  HOME CARE INSTRUCTIONS   Put ice on the affected area.   Put ice in a plastic bag.   Place a towel between your skin and the bag.   Leave the ice on for 15 to 20 minutes, 3 to 4 times a day or as directed by your caregiver.   Use a flat pillow when you sleep.   Only take over-the-counter or prescription medicines for pain, discomfort, or fever as directed by your caregiver.   If physical therapy was prescribed, follow your caregiver's directions.   If a soft collar was prescribed, use it as directed.  SEEK IMMEDIATE MEDICAL CARE IF:   Your pain gets much worse and cannot be controlled with medicines.   You have weakness or numbness in your hand, arm, face, or leg.   You have a high fever or a stiff, rigid neck.   You lose bowel or bladder control (incontinence).   You have trouble with walking, balance, or speaking.  MAKE SURE YOU:   Understand these instructions.   Will watch your condition.   Will get help right away if you are not  doing well or get worse.  Document Released: 06/18/2001 Document Revised: 09/12/2011 Document Reviewed: 05/07/2011 [facility] Patient Information 2012 [place], [place].Degenerative Disc Disease Degenerative disc disease is a condition caused by the changes that occur in the cushions of the backbone (spinal discs) as you grow older. Spinal discs are soft and compressible discs located between the bones of the spine (vertebrae). They act like shock absorbers. Degenerative disc disease can affect the wholespine. However, the neck and lower back are most commonly affected. Many changes can occur in the spinal discs with aging, such as:  The spinal discs may dry and shrink.   Small tears may occur in the tough, outer covering of the disc (annulus).   The disc space may become smaller due to loss of water.   Abnormal growths in the bone (spurs) may occur. This can put pressure on the nerve roots exiting the spinal canal, causing pain.   The spinal canal may become narrowed.  CAUSES  Degenerative disc disease is a condition caused by the changes that occur in the spinal discs with aging. The exact cause is not known, but there is a genetic basis for many patients. Degenerative changes can occur due to loss of fluid in the disc. This makes the disc thinner and reduces the space between the backbones. Small cracks can develop in the outer layer of the disc. This can lead to the breakdown of the disc. You are more likely to get degenerative disc disease if you are overweight. Smoking cigarettes and doing heavy work such as weightlifting can also increase your risk of this condition. Degenerative changes can start after a sudden injury. Growth of bone spurs can compress the nerve roots and cause pain.  SYMPTOMS  The symptoms vary from person to person. Some people may have no pain, while others have severe pain. The pain may be so severe that it can limit your activities. The location of the pain depends on the  part of your backbone that is affected. You will have neck or arm pain if a disc in the neck area is affected. You will have pain in your back, buttocks, or legs if a disc in the lower back is affected. The pain becomes worse while bending, reaching up, or with twisting movements. The pain may start gradually and then get worse as time passes. It may also start after a major or minor injury. You may feel numbness or tingling in the arms or legs.  DIAGNOSIS  Your caregiver will ask you about your symptoms and about activities or habits that may cause the pain. He or she may also ask about any injuries, diseases, ortreatments you have had earlier. Your caregiver will examine you to check for the range of movement that is possible in the affected area, to check for strength in your extremities, and to check for sensation in the areas of the arms and legs supplied by different nerve roots. An X-ray of the spine may be taken. Your caregiver may suggest other imaging tests, such as a computerized magnetic scan (MRI), if needed.  TREATMENT  Treatment includes rest, modifying your activities, and applying ice and heat. Your caregiver may prescribe medicines to reduce your pain and may ask you to do some exercises to strengthen your back. In some cases, you may need surgery. You and your caregiver will decide on the treatment that is best for you. HOME CARE INSTRUCTIONS   Follow proper lifting and walking techniques as advised by your caregiver.   Maintain good posture.   Exercise regularly as advised.   Perform relaxation exercises.   Change your sitting, standing, and sleeping habits as advised. Change positions frequently.   Lose weight as advised.   Stop smoking if you smoke.   Wear supportive footwear.  SEEK MEDICAL CARE IF:  The pain does not go away within 1 to 4 weeks. SEEK IMMEDIATE MEDICAL CARE IF:   The pain is severe.   You notice weakness in your arms, hands, or legs.   You begin to  lose control of your bladder or bowel.  MAKE SURE YOU:   Understand these instructions.   Will watch your condition.   Will get help right away if you are not doing well or get worse.  Document Released: 07/21/2007 Document Revised: 09/12/2011 Document Reviewed: 07/21/2007 [facility] Patient Information 2012 [place], [place].Radicular Pain Radicular pain in either the arm or leg is usually from a bulging or herniated disk in the  spine. A piece of the herniated disk may press against the nerves as the nerves exit the spine. This causes pain which is felt at the tips of the nerves down the arm or leg. Other causes of radicular pain may include:  Fractures.   Heart disease.   Cancer.   An abnormal and usually degenerative state of the nervous system or nerves (neuropathy).  Diagnosis may require CT or MRI scanning to determine the primary cause.  Nerves that start at the neck (nerve roots) may cause radicular pain in the outer shoulder and arm. It can spread down to the thumb and fingers. The symptoms vary depending on which nerve root has been affected. In most cases radicular pain improves with conservative treatment. Neck problems may require physical therapy, a neck collar, or cervical traction. Treatment may take many weeks, and surgery may be considered if the symptoms do not improve.  Conservative treatment is also recommended for sciatica. Sciatica causes pain to radiate from the lower back or buttock area down the leg into the foot. Often there is a history of back problems. Most patients with sciatica are better after 2 to 4 weeks of rest and other supportive care. Short term bed rest can reduce the disk pressure considerably. Sitting, however, is not a good position since this increases the pressure on the disk. You should avoid bending, lifting, and all other activities which make the problem worse. Traction can be used in severe cases. Surgery is usually reserved for patients who do not  improve within the first months of treatment. Only take over-the-counter or prescription medicines for pain, discomfort, or fever as directed by your caregiver. Narcotics and muscle relaxants may help by relieving more severe pain and spasm and by providing mild sedation. Cold or massage can give significant relief. Spinal manipulation is not recommended. It can increase the degree of disc protrusion. Epidural steroid injections are often effective treatment for radicular pain. These injections deliver medicine to the spinal nerve in the space between the protective covering of the spinal cord and back bones (vertebrae). Your caregiver can give you more information about steroid injections. These injections are most effective when given within two weeks of the onset of pain.  You should see your caregiver for follow up care as recommended. A program for neck and back injury rehabilitation with stretching and strengthening exercises is an important part of management.  SEEK IMMEDIATE MEDICAL CARE IF:  You develop increased pain, weakness, or numbness in your arm or leg.   You develop difficulty with bladder or bowel control.   You develop abdominal pain.  Document Released: 10/31/2004 Document Revised: 09/12/2011 Document Reviewed: 01/16/2009 [facility] Patient Information 2012 [place], [place].Peripheral Neuropathy Peripheral neuropathy is a common disorder of your nerves resulting from damage. CAUSES  This disorder may be caused by a disease of the nerves or illness. Many neuropathies have well known causes such as:  Diabetes. This is one of the most common causes.   Uremia.   AIDS.   Nutritional deficiencies.   Other causes include mechanical pressures. These may be from:   Compression.   Injury.   Contusions or bruises.   Fracture or dislocated bones.   Pressure involving the nerves close to the surface. Nerves such as the ulnar, or radial can be injured by prolonged use of  crutches.  Other injuries may come from:  Tumor.   Hemorrhage or bleeding into a nerve.   Exposure to cold or radiation.   Certain medicines  or toxic substances (rare).   Vascular or collagen disorders such as:   Atherosclerosis.   Systemic lupus erythematosus.   Scleroderma.   Sarcoidosis.   Rheumatoid arthritis.   Polyarteritis nodosa.   A large number of cases are of unknown cause.  SYMPTOMS  Common problems include:  Weakness.   Numbness.   Abnormal sensations (paresthesia) such as:   Burning.   Tickling.   Pricking.   Tingling.   Pain in the arms, hands, legs and/or feet.  TREATMENT  Therapy for this disorder differs depending on the cause. It may vary from medical treatment with medications or physical therapy among others.   For example, therapy for this disorder caused by diabetes involves control of the diabetes.   In cases where a tumor or ruptured disc is the cause, therapy may involve surgery. This would be to remove the tumor or to repair the ruptured disc.   In entrapment or compression neuropathy, treatment may consist of splinting or surgical decompression of the ulnar or median nerves. A common example of entrapment neuropathy is carpal tunnel syndrome. This has become more common because of the increasing use of computers.   Peroneal and radial compression neuropathies may require avoidance of pressure.   Physical therapy and/or splints may be useful in preventing contractures. This is a condition in which shortened muscles around joints cause abnormal and sometimes painful positioning of the joints.  Document Released: 09/13/2002 Document Revised: 06/05/2011 Document Reviewed: 09/23/2005 [facility] Patient Information 2012 [place], [place].Spinal Stenosis One cause of back pain is spinal stenosis. Stenosis means abnormal narrowing. The spinal canal contains and protects the spinal nerve roots. In spinal stenosis, the spinal canal narrows and  pinches the spinal cord and nerves. This causes low back pain and pain in the legs. Stenosis may pinch the nerves that control muscles and sensation in the legs. This leads to pain and abnormal feelings in the leg muscles and areas supplied by those nerves. CAUSES  Spinal stenosis often happens to people as they get older and arthritic boney growths occur in their spinal canal. There is also a loss of the disk height between the bones of the back, which also adds to this problem. Sometimes the problem is present at birth. SYMPTOMS   Pain that is generally worse with activities, particularly standing and walking.   Numbness, tingling, hot or cold feelings, weakness, or a weariness in the legs.   Clumsiness, frequent falling, and a foot-slapping gait, which may come as a result of nerve pressure and muscle weakness.  DIAGNOSIS   Your caregiver may suspect spinal stenosis if you have unusual leg symptoms, such as those previously mentioned.   Your orthopedic surgeon may request special imaging exams, such a computerized magnetic scan (MRI) or computerized X-ray scan (CT) to find out the cause of the problem.  TREATMENT   Sometimes treatments such as postural changes or nonsteroidal anti-inflammatory drugs will relieve the pain.   Nonsteroidal anti-inflammatory medications may help relieve symptoms. These medicines do this by decreasing swelling and inflammation in the nerves.   When stenosis causes severe nerve root compression, conservative treatment may not be enough to maintain a normal lifestyle. Surgery may be recommended to relieve the pressure on affected nerves. In properly selected patients, the results are very good, and patients are able to continue a normal lifestyle.  HOME CARE INSTRUCTIONS   Flexing the spine by leaning forward while walking may relieve symptoms. Lying with the knees drawn up to the chest may  offer some relief. These positions enlarge the space available to the  nerves. They may make it easier for stenosis sufferers to walk longer distances.   Rest, followed by gradually resuming activity, also can help.   Aerobic activity, such as bicycling or swimming, is often recommended.   Losing weight can also relieve some of the load on the spine.   Application of warm or cold compresses to the area of pain can be helpful.  SEEK MEDICAL CARE IF:   The periods of relief between episodes of pain become shorter and shorter.   You experience pain that radiates down your leg, even when you are not standing or walking.  SEEK IMMEDIATE MEDICAL CARE IF:   You have a loss of bowel or bladder control.   You have a sudden loss of feeling in your legs.   You suddenly cannot move your legs.  Document Released: 12/14/2003 Document Revised: 09/12/2011 Document Reviewed: 02/08/2010 [facility] Patient Information 2012 [place], [place].

## 2012-01-09 NOTE — ED Notes (Signed)
Lab notified of need for blood work.

## 2012-01-09 NOTE — ED Notes (Signed)
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

## 2013-02-12 IMAGING — CR DG CHEST 1V PORT
1 series · 1 of 1 positions shown · non-contrast
Comparison: None.

CLINICAL DATA: Chest pain

PORTABLE CHEST - 1 VIEW

[view not recorded]
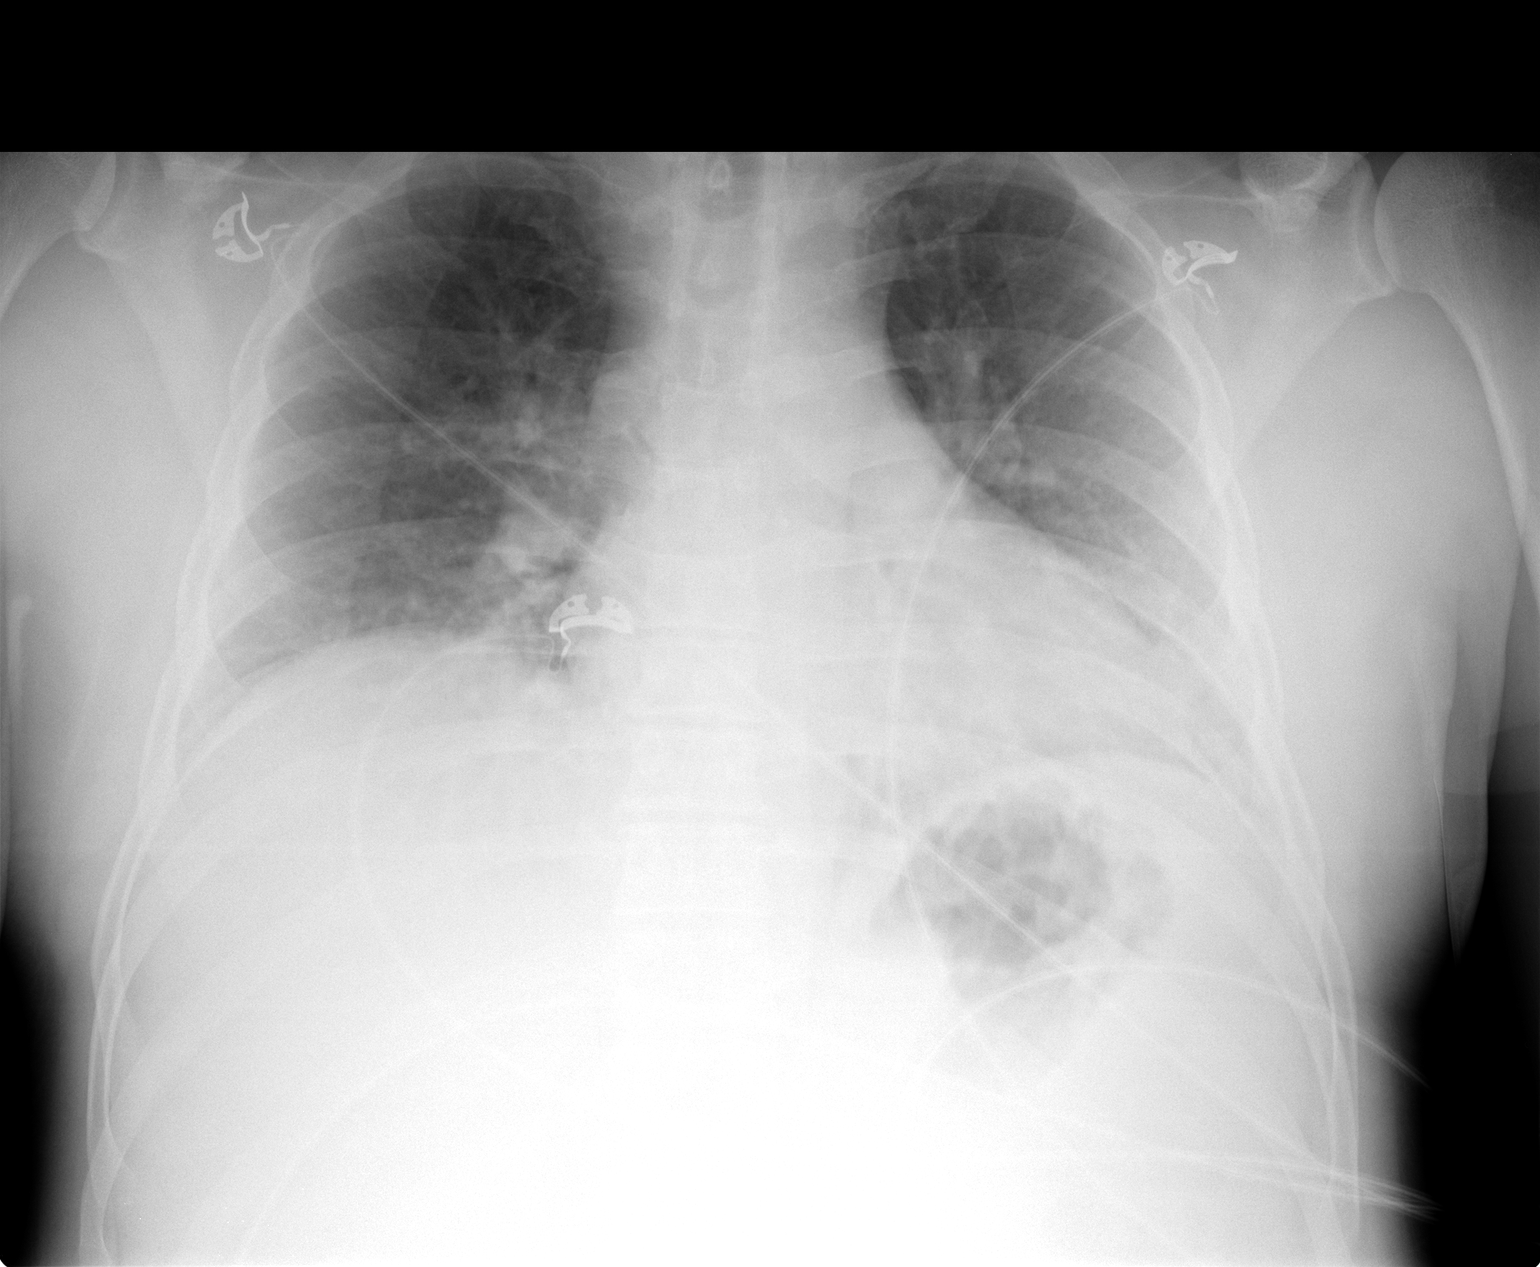

[1 of 1 positions shown; findings below may reference images not displayed]

FINDINGS: Low volumes.  Bibasilar atelectasis.  Normal heart size.
No pneumothorax.
IMPRESSION: Bibasilar atelectasis.

## 2013-02-13 IMAGING — CR DG CHEST 1V PORT
1 series · 1 of 1 positions shown · non-contrast
Comparison: 01/03/2012.

CLINICAL DATA: Pulmonary edema reassessment.

PORTABLE CHEST - 1 VIEW

[view not recorded]
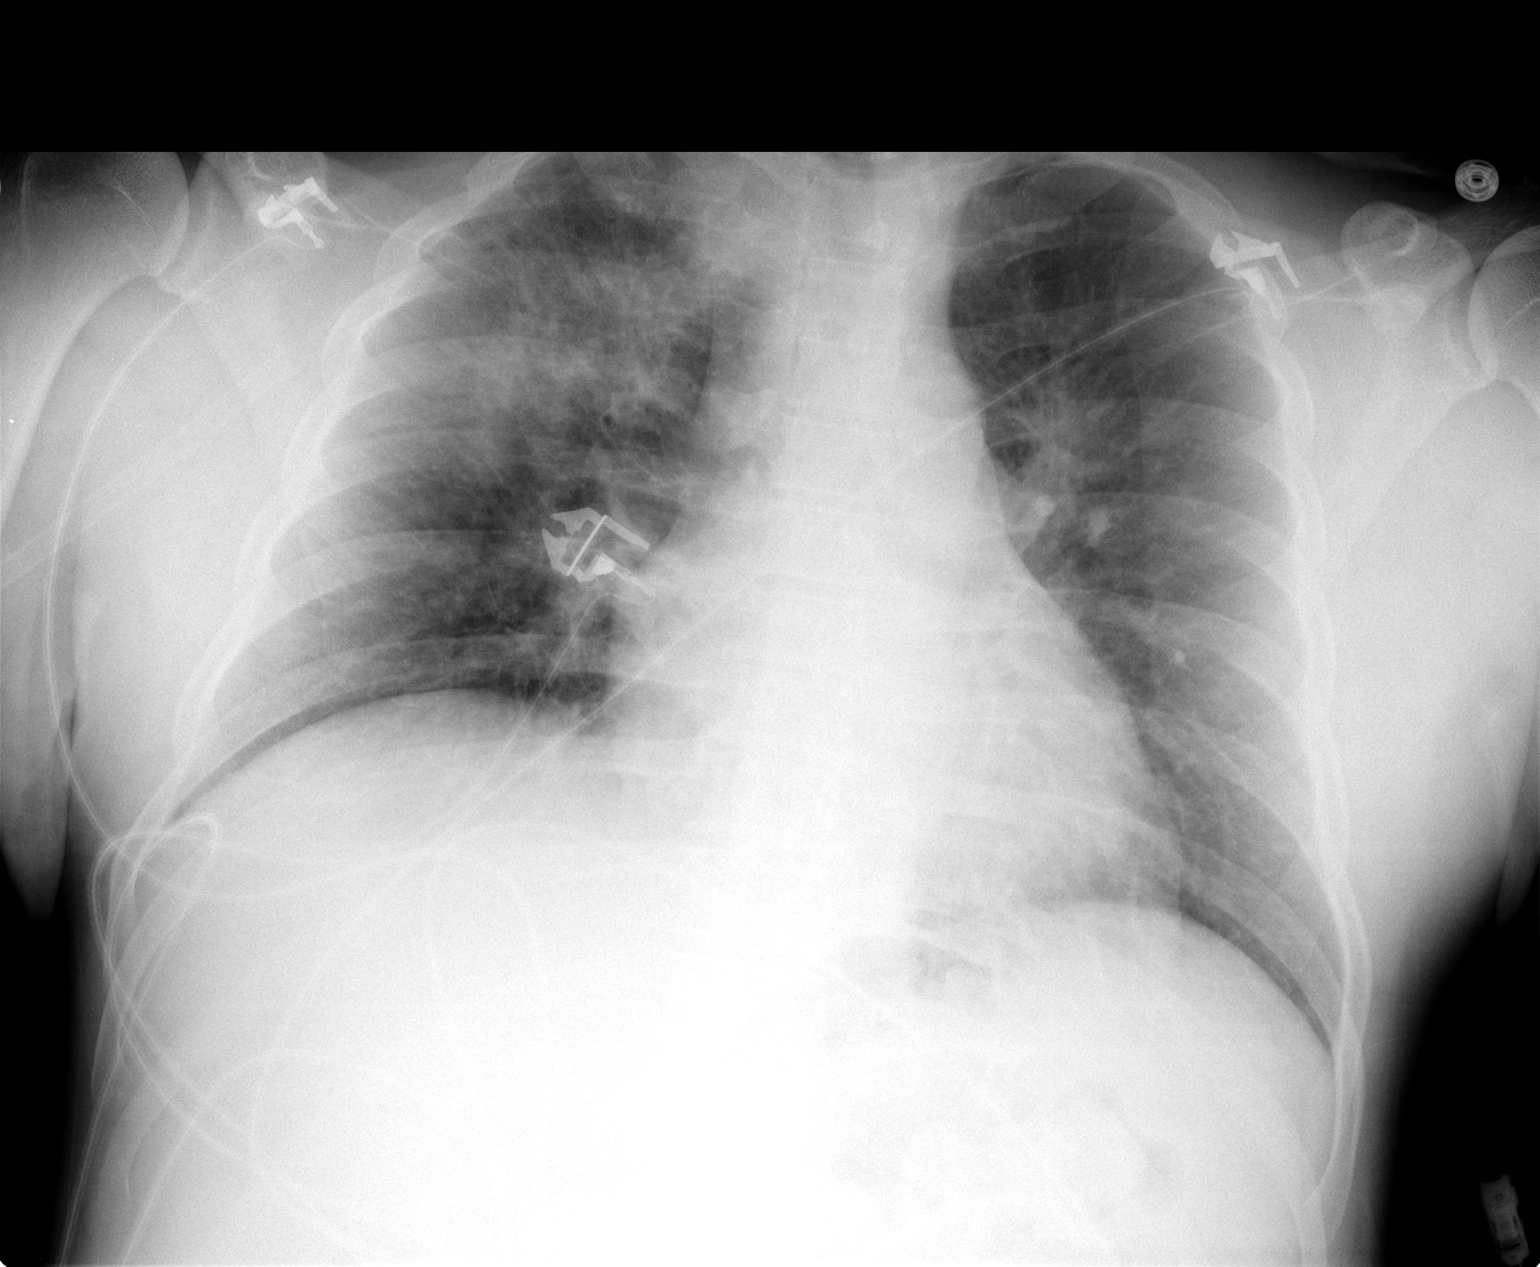

[1 of 1 positions shown; findings below may reference images not displayed]

FINDINGS: Apical lordotic projection.  Improving pulmonary
aeration.  Residual airspace disease is present with pulmonary
vascular congestion and more focal airspace disease in the right
upper lobe.  The asymmetric pulmonary edema versus infection are
primary considerations.  The cardiopericardial silhouette is
unchanged.
IMPRESSION: Improving pulmonary aeration most compatible with improving
pulmonary edema.  More focal density in the right upper lobe may
represent superimposed pneumonia or more likely,
asymmetric/atypical pulmonary edema.

## 2013-03-11 ENCOUNTER — Ambulatory Visit: Payer: Self-pay | Admitting: Gastroenterology

## 2013-03-11 ENCOUNTER — Encounter (HOSPITAL_COMMUNITY): Payer: BC Managed Care – PPO | Attending: Hematology and Oncology

## 2013-03-11 ENCOUNTER — Encounter (HOSPITAL_COMMUNITY): Payer: Self-pay

## 2013-03-11 VITALS — BP 102/69 | HR 106 | Temp 98.7°F | Resp 18 | Wt 169.0 lb

## 2013-03-11 DIAGNOSIS — D649 Anemia, unspecified: Secondary | ICD-10-CM | POA: Insufficient documentation

## 2013-03-11 DIAGNOSIS — L408 Other psoriasis: Secondary | ICD-10-CM

## 2013-03-11 DIAGNOSIS — D638 Anemia in other chronic diseases classified elsewhere: Secondary | ICD-10-CM

## 2013-03-11 DIAGNOSIS — L409 Psoriasis, unspecified: Secondary | ICD-10-CM

## 2013-03-11 LAB — COMPREHENSIVE METABOLIC PANEL
AST: 21 U/L (ref 0–37)
Albumin: 3.6 g/dL (ref 3.5–5.2)
BUN: 13 mg/dL (ref 6–23)
Calcium: 9 mg/dL (ref 8.4–10.5)
Creatinine, Ser: 1.17 mg/dL (ref 0.50–1.35)
Total Bilirubin: 0.4 mg/dL (ref 0.3–1.2)
Total Protein: 7.6 g/dL (ref 6.0–8.3)

## 2013-03-11 LAB — CBC WITH DIFFERENTIAL/PLATELET
Eosinophils Absolute: 0.2 10*3/uL (ref 0.0–0.7)
Hemoglobin: 11.8 g/dL — ABNORMAL LOW (ref 13.0–17.0)
Lymphocytes Relative: 26 % (ref 12–46)
Lymphs Abs: 0.8 10*3/uL (ref 0.7–4.0)
MCH: 32.4 pg (ref 26.0–34.0)
Monocytes Relative: 11 % (ref 3–12)
Neutrophils Relative %: 55 % (ref 43–77)
Platelets: 146 10*3/uL — ABNORMAL LOW (ref 150–400)
RBC: 3.64 MIL/uL — ABNORMAL LOW (ref 4.22–5.81)
WBC: 3 10*3/uL — ABNORMAL LOW (ref 4.0–10.5)

## 2013-03-11 LAB — FERRITIN: Ferritin: 553 ng/mL — ABNORMAL HIGH (ref 22–322)

## 2013-03-11 LAB — IRON AND TIBC
Iron: 31 ug/dL — ABNORMAL LOW (ref 42–135)
Saturation Ratios: 13 % — ABNORMAL LOW (ref 20–55)
TIBC: 234 ug/dL (ref 215–435)
UIBC: 203 ug/dL (ref 125–400)

## 2013-03-11 LAB — FOLATE: Folate: >20 ng/mL

## 2013-03-11 LAB — LACTATE DEHYDROGENASE: LDH: 190 U/L (ref 94–250)

## 2013-03-11 LAB — VITAMIN B12: Vitamin B-12: 285 pg/mL (ref 211–911)

## 2013-03-11 NOTE — Patient Instructions (Addendum)
[facility] Discharge Instructions  RECOMMENDATIONS MADE BY THE CONSULTANT AND ANY TEST RESULTS WILL BE SENT TO YOUR REFERRING PHYSICIAN.  EXAM FINDINGS BY THE PHYSICIAN TODAY AND SIGNS OR SYMPTOMS TO REPORT TO CLINIC OR PRIMARY PHYSICIAN: Exam findings as discussed by Dr. Sharia Reeve.  SPECIAL INSTRUCTIONS/FOLLOW-UP: 1.  You had labs done today, and we will contact you with any abnormal results. 2.  Return in 1 week for an MD visit to follow-up on your lab results and discuss a possible treatment plan if indicated. 3.  We referred you to a dermatologist, Dr. Nita Sells.  Thank you for choosing [facility] to provide your oncology and hematology care.  To afford each patient quality time with our providers, please arrive at least 15 minutes before your scheduled appointment time.  With your help, our goal is to use those 15 minutes to complete the necessary work-up to ensure our physicians have the information they need to help with your evaluation and healthcare recommendations.    Effective January 1st, 2014, we ask that you re-schedule your appointment with our physicians should you arrive 10 or more minutes late for your appointment.  We strive to give you quality time with our providers, and arriving late affects you and other patients whose appointments are after yours.    Again, thank you for choosing [facility].  Our hope is that these requests will decrease the amount of time that you wait before being seen by our physicians.       _____________________________________________________________  Should you have questions after your visit to [facility], please contact our office at [phone] between the hours of 8:30 a.m. and 5:00 p.m.  Voicemails left after 4:30 p.m. will not be returned until the following business day.  For prescription refill requests, have your pharmacy contact our office with your prescription refill  request.

## 2013-03-11 NOTE — Progress Notes (Addendum)
Patient History and Physical   Ryan [name] [id number] 02-14-1971 42 y.o.  Referring physician:Nyland, Darcel Bayley, MD  Chief Complaint: Anemia   HPI:  Dear Dr. Lysbeth Galas   Thank you for the opportunity to participate in the care of this patient.  I had the opportunity of seeing him in clinic today. I have reviewed his records and electronic healthcare system. I was not able to gain access at this time to reported  recent blood work patient states that he had. However, looking at his previous blood work in 2013,March patient hemoglobin ranged from 11-12.5 g/dL. He had normal WBC and  platelets was slightly low but range was 125-148 K. Differential counts were essentially normal except for lymphopenia.There are no more recent results as regards his historical CBC's.patient tells me that he  previously did not know to he was  anemic  Until recently. He has also not had any previous blood transfusion.  He states that he had a protracted course of diarrhea that  lasted several months and tells me that he has a previous diagnosis of irritable bowel syndrome.Overall he says that  he feels better and his diarrhea has resolved spontaneously.He also  gave a history of constipation alternating with diarrhea.He denies any family history or personal history of inflammatory bowel disease or celiac disease. He tells me that he has external hemorrhoids which have been shown a painful and sometimes bleeds. He otherwise denies any new problem. However, on exam I noted a some psoriatic like lesions and he did admit that he has a recent history of psoriatic lesions but this has not been formally evaluated but dermatologic evaluation has been suggested in the past. He states that these are fairly recent the last couple of years.. 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 PMH: Past Medical History  Diagnosis Date  . Hypertension   . Bipolar disorder   . Depressed     Past Surgical History  Procedure Laterality Date  . Finger surgery      Allergies: Allergies  Allergen Reactions  . Levaquin (Levofloxacin In D5w)   . Sulfa Antibiotics Rash    Medications: Current outpatient prescriptions:citalopram (CELEXA) 40 MG tablet, Take 40 mg by mouth at bedtime. , Disp: , Rfl: ;  dexlansoprazole (DEXILANT) 60 MG capsule, Take 60 mg by mouth daily., Disp: , Rfl: ;  LORazepam (ATIVAN) 0.5 MG tablet, Take 0.5 mg by mouth 3 (three) times daily as needed., Disp: , Rfl: ;  metoprolol succinate (TOPROL-XL) 50 MG 24 hr tablet, Take 50 mg by mouth daily. Take with or immediately following a meal., Disp: , Rfl:  QUEtiapine (SEROQUEL) 300 MG tablet, Take 300 mg by mouth at bedtime., Disp: , Rfl:    Social History:   reports that he has never smoked. He does not have any smokeless tobacco history on file. He reports that he does not drink alcohol or use illicit drugs. He presently lives alone and was recently laid off work.He is divorced. He states that the Ativan he gets for his mood disorder does not adequately control his symptoms are sometimes he has to buy pills from the Street.  Family History: His father died of throat cancer was a smoker. His sister died of uterine cancer at the age  of 41.He denies family history of any blood this is otherwise.  Review of Systems:14 point review of system is as in the history above otherwise negative.   Physical Exam: Blood pressure 102/69, pulse 106, temperature 98.7 F (37.1 C), temperature source Oral, resp. rate 18, weight 169 lb (76.658 kg). GENERAL: No distress, well nourished.  SKIN: psoriatic lesions  with slivery white scaly plaque lesions mostly in the legs   But some in the thigh. HEAD: Normocephalic, No masses, lesions, tenderness or abnormalities  EYES:  Conjunctiva are pink and non-injected  ENT: External ears normal ,lips, buccal mucosa, and tongue normal and mucous membranes are moist  LYMPH: No palpable lymphadenopathy in the neck, supraclavicular, axillary or inguinal lymph node areas.  LUNGS: clear to auscultation , no crackles or wheezes HEART: regular rate & rhythm, no murmurs, no gallops, S1 normal and S2 normal  ABDOMEN: Abdomen soft, non-tender, normal bowel sounds, no masses or organomegaly and no hepatosplenomegaly  EXTREMITIES: No edema, no skin discoloration or tenderness NEURO: alert & oriented , no focal motor/sensory deficits. GI: Anal exam showed minute erythema no obvious lesions noted.    Lab Results: Lab Results  Component Value Date   WBC 6.5 01/05/2012   HGB 12.5* 01/05/2012   HCT 35.7* 01/05/2012   MCV 88.1 01/05/2012   PLT 148* 01/05/2012     Chemistry      Component Value Date/Time   NA 135 01/09/2012 1732   K 3.8 01/09/2012 1732   CL 100 01/09/2012 1732   CO2 26 01/09/2012 1732   BUN 16 01/09/2012 1732   CREATININE 0.99 01/09/2012 1732      Component Value Date/Time   CALCIUM 9.2 01/09/2012 1732   ALKPHOS 43 01/04/2012 0517   AST 26 01/04/2012 0517   ALT 21 01/04/2012 0517   BILITOT 0.4 01/04/2012 0517         Radiological Studies: No results found.    Impression  Ryan [name] has history of mild anemia dating back to 2013. Etiology of this is unclear however given her psoriatic lesions especially on the lower extremities anemia of chronic inflammation is likely.Regardless I  will do full anemia workup,including CBC review of peripheral blood smear, iron studies, B12, folic acid, CMP  and LDH .clinically his skin lesions are consistent with psoriasis. From his past results anemia is mild  my the patient is largely asymptomatic.  Recommendations: 1. Lab tests as ordered above. 2. Referral for dermatologic evaluation of his skin lesions. 3.He'll return to clinic in 1 week to review the results.further  Work up  will be based on findings and labs ordered today, if any is indicated.  For example iron deficiency would prompt gastrointestinal workup for source of bleeding.   Sherral Hammers, MD FACP. Hematology/Oncology. 03/11/2013, 1:30 PM    Addendum: Review of peripheral blood smear showed anisocytosis, normochromia mild polychromasia ,giant platelets and mature neutrophils otherwise unremarkable.

## 2013-03-12 ENCOUNTER — Encounter (HOSPITAL_COMMUNITY): Payer: Self-pay

## 2013-03-12 ENCOUNTER — Other Ambulatory Visit (HOSPITAL_COMMUNITY): Payer: Self-pay | Admitting: Hematology and Oncology

## 2013-03-12 DIAGNOSIS — E538 Deficiency of other specified B group vitamins: Secondary | ICD-10-CM

## 2013-03-12 MED ORDER — VITAMIN B-12 1000 MCG PO TABS: 1000.0000 ug | ORAL_TABLET | Freq: Every day | ORAL | Status: DC

## 2013-03-18 ENCOUNTER — Ambulatory Visit (HOSPITAL_COMMUNITY): Payer: BC Managed Care – PPO | Admitting: Oncology

## 2013-05-20 ENCOUNTER — Observation Stay (HOSPITAL_COMMUNITY)
Admission: EM | Admit: 2013-05-20 | Discharge: 2013-05-22 | Disposition: A | Discharge status: Home / Self Care | Payer: Medicaid Other | Attending: Internal Medicine | Admitting: Internal Medicine

## 2013-05-20 ENCOUNTER — Emergency Department (HOSPITAL_COMMUNITY): Payer: Medicaid Other

## 2013-05-20 ENCOUNTER — Encounter (HOSPITAL_COMMUNITY): Payer: Self-pay | Admitting: Internal Medicine

## 2013-05-20 DIAGNOSIS — E860 Dehydration: Secondary | ICD-10-CM

## 2013-05-20 DIAGNOSIS — R079 Chest pain, unspecified: Principal | ICD-10-CM | POA: Insufficient documentation

## 2013-05-20 DIAGNOSIS — F319 Bipolar disorder, unspecified: Secondary | ICD-10-CM | POA: Insufficient documentation

## 2013-05-20 DIAGNOSIS — I10 Essential (primary) hypertension: Secondary | ICD-10-CM | POA: Insufficient documentation

## 2013-05-20 DIAGNOSIS — E440 Moderate protein-calorie malnutrition: Secondary | ICD-10-CM | POA: Insufficient documentation

## 2013-05-20 DIAGNOSIS — I951 Orthostatic hypotension: Secondary | ICD-10-CM | POA: Insufficient documentation

## 2013-05-20 DIAGNOSIS — B20 Human immunodeficiency virus [HIV] disease: Secondary | ICD-10-CM | POA: Insufficient documentation

## 2013-05-20 DIAGNOSIS — R634 Abnormal weight loss: Secondary | ICD-10-CM | POA: Insufficient documentation

## 2013-05-20 DIAGNOSIS — K219 Gastro-esophageal reflux disease without esophagitis: Secondary | ICD-10-CM | POA: Insufficient documentation

## 2013-05-20 LAB — BASIC METABOLIC PANEL
BUN: 12 mg/dL (ref 6–23)
CO2: 26 mEq/L (ref 19–32)
Chloride: 101 mEq/L (ref 96–112)
Glucose, Bld: 84 mg/dL (ref 70–99)
Potassium: 3.8 mEq/L (ref 3.5–5.1)
Sodium: 136 mEq/L (ref 135–145)

## 2013-05-20 LAB — HEPATIC FUNCTION PANEL
ALT: 13 U/L (ref 0–53)
Albumin: 3.1 g/dL — ABNORMAL LOW (ref 3.5–5.2)
Alkaline Phosphatase: 60 U/L (ref 39–117)
Total Protein: 7.5 g/dL (ref 6.0–8.3)

## 2013-05-20 LAB — CBC WITH DIFFERENTIAL/PLATELET
Basophils Relative: 0 % (ref 0–1)
Eosinophils Absolute: 0.1 10*3/uL (ref 0.0–0.7)
HCT: 32.9 % — ABNORMAL LOW (ref 39.0–52.0)
Hemoglobin: 10.9 g/dL — ABNORMAL LOW (ref 13.0–17.0)
Lymphs Abs: 0.5 10*3/uL — ABNORMAL LOW (ref 0.7–4.0)
MCH: 30.3 pg (ref 26.0–34.0)
MCHC: 33.1 g/dL (ref 30.0–36.0)
MCV: 91.4 fL (ref 78.0–100.0)
Monocytes Absolute: 0.4 10*3/uL (ref 0.1–1.0)
Monocytes Relative: 11 % (ref 3–12)
Neutrophils Relative %: 72 % (ref 43–77)
RBC: 3.6 MIL/uL — ABNORMAL LOW (ref 4.22–5.81)

## 2013-05-20 LAB — RAPID HIV SCREEN (WH-MAU): Rapid HIV Screen: REACTIVE — AB

## 2013-05-20 LAB — MRSA PCR SCREENING: MRSA by PCR: NEGATIVE

## 2013-05-20 MED ORDER — ASPIRIN EC 81 MG PO TBEC
81.0000 mg | DELAYED_RELEASE_TABLET | Freq: Every day | ORAL | Status: DC
  Administered 2013-05-21 – 2013-05-22 (×2): 81 mg via ORAL
  Filled 2013-05-20 (×2): qty 1

## 2013-05-20 MED ORDER — SODIUM CHLORIDE 0.9 % IV SOLN
INTRAVENOUS | Status: DC
  Administered 2013-05-20 – 2013-05-21 (×3): via INTRAVENOUS

## 2013-05-20 MED ORDER — ACETAMINOPHEN 650 MG RE SUPP: 650.0000 mg | Freq: Four times a day (QID) | RECTAL | Status: DC | PRN

## 2013-05-20 MED ORDER — ASPIRIN 81 MG PO CHEW
324.0000 mg | CHEWABLE_TABLET | Freq: Once | ORAL | Status: AC
  Administered 2013-05-20: 324 mg via ORAL
  Filled 2013-05-20: qty 4

## 2013-05-20 MED ORDER — LORAZEPAM 0.5 MG PO TABS
0.5000 mg | ORAL_TABLET | Freq: Three times a day (TID) | ORAL | Status: DC | PRN
  Administered 2013-05-20 – 2013-05-22 (×5): 0.5 mg via ORAL
  Filled 2013-05-20 (×5): qty 1

## 2013-05-20 MED ORDER — SODIUM CHLORIDE 0.9 % IJ SOLN
3.0000 mL | Freq: Two times a day (BID) | INTRAMUSCULAR | Status: DC
  Administered 2013-05-20 – 2013-05-21 (×2): 3 mL via INTRAVENOUS

## 2013-05-20 MED ORDER — ACETAMINOPHEN 325 MG PO TABS
650.0000 mg | ORAL_TABLET | Freq: Four times a day (QID) | ORAL | Status: DC | PRN
  Administered 2013-05-21: 650 mg via ORAL
  Filled 2013-05-20: qty 2

## 2013-05-20 MED ORDER — VITAMIN B-1 100 MG PO TABS
100.0000 mg | ORAL_TABLET | Freq: Every day | ORAL | Status: DC
  Administered 2013-05-21 – 2013-05-22 (×2): 100 mg via ORAL
  Filled 2013-05-20 (×2): qty 1

## 2013-05-20 MED ORDER — ONDANSETRON HCL 4 MG PO TABS: 4.0000 mg | ORAL_TABLET | Freq: Four times a day (QID) | ORAL | Status: DC | PRN

## 2013-05-20 MED ORDER — PANTOPRAZOLE SODIUM 40 MG PO TBEC
80.0000 mg | DELAYED_RELEASE_TABLET | Freq: Every day | ORAL | Status: DC
  Administered 2013-05-20 – 2013-05-22 (×3): 80 mg via ORAL
  Filled 2013-05-20: qty 2
  Filled 2013-05-20 (×2): qty 1
  Filled 2013-05-20: qty 2

## 2013-05-20 MED ORDER — DOCUSATE SODIUM 100 MG PO CAPS
100.0000 mg | ORAL_CAPSULE | Freq: Two times a day (BID) | ORAL | Status: DC
  Administered 2013-05-20 – 2013-05-21 (×2): 100 mg via ORAL
  Filled 2013-05-20 (×2): qty 1

## 2013-05-20 MED ORDER — ENOXAPARIN SODIUM 40 MG/0.4ML ~~LOC~~ SOLN
40.0000 mg | SUBCUTANEOUS | Status: DC
  Administered 2013-05-20 – 2013-05-21 (×2): 40 mg via SUBCUTANEOUS
  Filled 2013-05-20 (×2): qty 0.4

## 2013-05-20 MED ORDER — SODIUM CHLORIDE 0.9 % IV BOLUS (SEPSIS)
1000.0000 mL | Freq: Once | INTRAVENOUS | Status: AC
  Administered 2013-05-20: 1000 mL via INTRAVENOUS

## 2013-05-20 MED ORDER — ADULT MULTIVITAMIN W/MINERALS CH
1.0000 | ORAL_TABLET | Freq: Every day | ORAL | Status: DC
  Administered 2013-05-21 – 2013-05-22 (×2): 1 via ORAL
  Filled 2013-05-20 (×2): qty 1

## 2013-05-20 MED ORDER — QUETIAPINE FUMARATE 100 MG PO TABS
300.0000 mg | ORAL_TABLET | Freq: Every day | ORAL | Status: DC
  Administered 2013-05-20 – 2013-05-21 (×2): 300 mg via ORAL
  Filled 2013-05-20: qty 1
  Filled 2013-05-20 (×2): qty 3

## 2013-05-20 MED ORDER — ONDANSETRON HCL 4 MG/2ML IJ SOLN: 4.0000 mg | Freq: Four times a day (QID) | INTRAMUSCULAR | Status: DC | PRN

## 2013-05-20 NOTE — ED Provider Notes (Signed)
CSN: [id number]     Arrival date & time 05/20/13  1610 History    This chart was scribed for Joya Gaskins, MD,  by Ashley Jacobs, ED Scribe. The patient was seen in room APA01/APA01 and the patient's care was started at 5:10 PM.     Chief Complaint  Patient presents with  . Chest Pain   Patient is a 42 y.o. male presenting with chest pain. The history is provided by the patient and medical records. No language interpreter was used.  Chest Pain Pain location:  L chest and substernal area Pain quality: burning   Pain radiates to:  Does not radiate Pain radiates to the back: no   Pain severity:  Moderate Onset quality:  Gradual Duration:  1 day Timing:  Constant Chronicity:  Chronic Context: breathing and movement   Worsened by:  Exertion Ineffective treatments:  None tried Associated symptoms: fatigue, nausea, near-syncope, palpitations, shortness of breath and weakness   Associated symptoms: no abdominal pain, no fever and not vomiting   Fatigue:    Severity:  Moderate   Timing:  Constant   Progression:  Unchanged Nausea:    Severity:  Mild   Onset quality:  Gradual   Timing:  Sporadic   Progression:  Unchanged Shortness of breath:    Severity:  Mild   Onset quality:  Gradual   Timing:  Sporadic   Progression:  Unchanged Weakness:    Severity:  Moderate   Onset quality:  Gradual   Chronicity:  Chronic   Timing:  Constant   Progression:  Unchanged Risk factors: hypertension and male sex    HPI Comments: JERRAL MCCAULEY is a 42 y.o. male who presents to the Emergency Department complaining of left sided and medial, constant, moderate, non radiating, chest pain, that presented 2 weeks PTA and has worsened the day PTA.Pt reports that he has the associated symptoms of SOB, discomfort when breathing deeply, CP, heart palpitations, CP and weakness upon exertion.  In addition he mentions, over the past year, he has experienced significant weight loss but has lost most  of the weight over the last two months  and mentions spending much of his time laying around due to weakness. Pt denies fever, vomiting, diarrhea, hematoochezia, abd pain.  Pt denies family hx of heart problems and denies having a hx strokes and heart attacks or clotting disorders. Pt does have a hx of bipolar disorder, depression, and hypertension. Pt denies SI and HI. Pt also denies any illegal drug use.   Past Medical History  Diagnosis Date  . Hypertension   . Bipolar disorder   . Depressed    Past Surgical History  Procedure Laterality Date  . Finger surgery     No family history on file. History  Substance Use Topics  . Smoking status: Never Smoker   . Smokeless tobacco: Not on file  . Alcohol Use: No    Review of Systems  Constitutional: Positive for activity change, fatigue and unexpected weight change. Negative for fever.  Respiratory: Positive for shortness of breath.   Cardiovascular: Positive for chest pain, palpitations and near-syncope.  Gastrointestinal: Positive for nausea. Negative for vomiting, abdominal pain and blood in stool.  Neurological: Positive for weakness.  Psychiatric/Behavioral: Negative for suicidal ideas.  All other systems reviewed and are negative.    Allergies  Levaquin and Sulfa antibiotics  Home Medications   Current Outpatient Rx  Name  Route  Sig  Dispense  Refill  . citalopram (CELEXA)  40 MG tablet   Oral   Take 40 mg by mouth at bedtime.          Marland Kitchen dexlansoprazole (DEXILANT) 60 MG capsule   Oral   Take 60 mg by mouth daily.         Marland Kitchen LORazepam (ATIVAN) 0.5 MG tablet   Oral   Take 0.5 mg by mouth 3 (three) times daily as needed.         . metoprolol succinate (TOPROL-XL) 50 MG 24 hr tablet   Oral   Take 50 mg by mouth daily. Take with or immediately following a meal.         . QUEtiapine (SEROQUEL) 300 MG tablet   Oral   Take 300 mg by mouth at bedtime.         . vitamin B-12 (CYANOCOBALAMIN) 1000 MCG  tablet   Oral   Take 1 tablet (1,000 mcg total) by mouth daily.   100 tablet   3    BP 96/65  Pulse 110  Temp(Src) 99.8 F (37.7 C)  Resp 16  Wt 160 lb (72.576 kg)  BMI 23.62 kg/m2  SpO2 96% BP 115/76  Pulse 88  Temp(Src) 99.8 F (37.7 C)  Resp 18  Wt 160 lb (72.576 kg)  BMI 23.62 kg/m2  SpO2 100%  Physical Exam CONSTITUTIONAL: Well developed/well nourished HEAD: Normocephalic/atraumatic EYES: EOMI/PERRL ENMT: Mucous membranes moist NECK: supple no meningeal signs SPINE:entire spine nontender CV: S1/S2 noted, no murmurs/rubs/gallops noted LUNGS: Lungs are clear to auscultation bilaterally, no apparent distress ABDOMEN: soft, nontender, no rebound or guarding GU:no cva tenderness NEURO: Pt is awake/alert, moves all extremitiesx4 EXTREMITIES: pulses normal, full ROM SKIN: warm, color normal PSYCH: flat affect  ED Course  DIAGNOSTIC STUDIES: Oxygen Saturation is 96% on room air, adequate  by my interpretation.    COORDINATION OF CARE: 5:17 PM Discussed course of care with pt . Pt understands and agrees. Pt concerned he may have HIV and requests HIV screening Pt informed of limitations of this test in the ED but will order   7:15 PM Pt informed of test results and need for full HIV confirmation Will also admit for chest pain (reported exertional CP, none at this time, doubt PE) Also dehydration and with orthostatic hypotension D/w dr Rito Ehrlich will admit Procedures   Labs Reviewed - No data to display Dg Chest 2 View  05/20/2013   *RADIOLOGY REPORT*  Clinical Data: Chest pain, weakness  CHEST - 2 VIEW  Comparison: Portable chest x-ray of 01/04/2012  Findings: No active infiltrate or effusion is seen.  Mediastinal contours appear stable.  The heart is within normal limits in size. No bony abnormality is seen.  IMPRESSION: No active lung disease.   Original Report Authenticated By: Dwyane Dee, M.D.   No diagnosis found.  MDM  Nursing notes including past  medical history and social history reviewed and considered in documentation xrays reviewed and considered Labs/vital reviewed and considered Previous records reviewed and considered      Date: 05/20/2013  Rate: 112  Rhythm: sinus tachycardia  QRS Axis: normal  Intervals: normal  ST/T Wave abnormalities: nonspecific ST changes  Conduction Disutrbances:none  Narrative Interpretation:   Old EKG Reviewed: rate is faster when compared to prior EKG    I personally performed the services described in this documentation, which was scribed in my presence. The recorded information has been reviewed and is accurate.      Joya Gaskins, MD 05/20/13 [phone]

## 2013-05-20 NOTE — ED Notes (Signed)
States that he has been having left sided chest pain (points to left ribs) for about 2 weeks.  States that he feels extremely weak.

## 2013-05-20 NOTE — H&P (Signed)
[facility] History and Physical  Ryan [name] ZOX:[id number] DOB: [birth year] DOA: 05/20/2013   PCP: Josue Hector, MD  Specialists: None  Chief Complaint: Chest pain with exertion  HPI: Ryan [name] is a 42 y.o. male with past medical history of bipolar disorder, hypertension, GERD, who was in his usual state of health till the last couple of weeks when he has noticed that he gets chest pressure in the central part of his chest as well as on the left side with exertion. The symptoms subside when he rests. He gets lightheaded when he stands up and has had a few syncopal episodes. The chest pressure was 8/10 in intensity, associated with palpitations. Denies any symptoms suggestive of diaphoresis. He also has shortness of breath when he exerts himself. Denies any leg swelling. He's never had any stress test or any other cardiac workup in the past. He has been feeling weak and tired over the last many months. He's lost about 70 pounds in the last 6 months. Denies any recent diarrhea or nausea, vomiting. He was working in extreme heat till a few weeks ago, but he is not working any longer. He used to work in [employer], dispensing. Patient was found to be orthostatic in the emergency department.  Home Medications: Prior to Admission medications   Medication Sig Start Date End Date Taking? Authorizing Provider  dexlansoprazole (DEXILANT) 60 MG capsule Take 60 mg by mouth every evening.    Yes Historical Provider, MD  LORazepam (ATIVAN) 0.5 MG tablet Take 0.5 mg by mouth 3 (three) times daily as needed for anxiety.    Yes Historical Provider, MD  metoprolol succinate (TOPROL-XL) 50 MG 24 hr tablet Take 50 mg by mouth every evening. Take with or immediately following a meal.   Yes Historical Provider, MD  QUEtiapine (SEROQUEL) 300 MG tablet Take 300 mg by mouth at bedtime.   Yes Historical Provider, MD    Allergies:  Allergies  Allergen Reactions  . Levaquin [Levofloxacin In  D5w]   . Sulfa Antibiotics Rash    Past Medical History: Past Medical History  Diagnosis Date  . Hypertension   . Bipolar disorder   . Depressed     Past Surgical History  Procedure Laterality Date  . Finger surgery      Social History:  reports that he has never smoked. He does not have any smokeless tobacco history on file. He reports that he does not drink alcohol or use illicit drugs.  Living Situation: He lives by himself Activity Level: Independent in daily activities   Family History:  Family History  Problem Relation Age of Onset  . Esophageal cancer Father    Review of Systems - History obtained from the patient General ROS: positive for  - fatigue Psychological ROS: positive for - anxiety Ophthalmic ROS: negative ENT ROS: negative Allergy and Immunology ROS: negative Hematological and Lymphatic ROS: negative Endocrine ROS: negative Respiratory ROS: as in hpi Cardiovascular ROS: as in hpi Gastrointestinal ROS: positive for - constipation Genito-Urinary ROS: no dysuria, trouble voiding, or hematuria Musculoskeletal ROS: negative Neurological ROS: no TIA or stroke symptoms Dermatological ROS: negative  Physical Examination  Filed Vitals:   05/20/13 1744 05/20/13 1746 05/20/13 1748 05/20/13 1904  BP: 99/65 103/74 89/68 115/76  Pulse: 108 114 131 88  Temp:      Resp:    18  Weight:      SpO2:    100%    General appearance: alert, cooperative, appears stated age  and no distress. Somewhat flat affect Head: Normocephalic, without obvious abnormality, atraumatic Eyes: conjunctivae/corneas clear. PERRL, EOM's intact. Throat: lips, mucosa, and tongue normal; teeth and gums normal Neck: no adenopathy, no carotid bruit, no JVD, supple, symmetrical, trachea midline and thyroid not enlarged, symmetric, no tenderness/mass/nodules Resp: clear to auscultation bilaterally Cardio: regular rate and rhythm, S1, S2 normal, no murmur, click, rub or gallop GI: soft,  non-tender; bowel sounds normal; no masses,  no organomegaly Extremities: extremities normal, atraumatic, no cyanosis or edema Pulses: 2+ and symmetric Skin: Skin color, texture, turgor normal. No rashes or lesions Lymph nodes: Cervical, supraclavicular, and axillary nodes normal. Neurologic: Alert and oriented x 3. No focal deficits.  Laboratory Data: Results for orders placed during the hospital encounter of 05/20/13 (from the past 48 hour(s))  BASIC METABOLIC PANEL     Status: None   Collection Time    05/20/13  5:25 PM      Result Value Range   Sodium 136  135 - 145 mEq/L   Potassium 3.8  3.5 - 5.1 mEq/L   Chloride 101  96 - 112 mEq/L   CO2 26  19 - 32 mEq/L   Glucose, Bld 84  70 - 99 mg/dL   BUN 12  6 - 23 mg/dL   Creatinine, Ser 0.10  0.50 - 1.35 mg/dL   Calcium 8.5  8.4 - 27.2 mg/dL   GFR calc non Af Amer >90  >90 mL/min   GFR calc Af Amer >90  >90 mL/min   Comment: (NOTE)     The eGFR has been calculated using the CKD EPI equation.     This calculation has not been validated in all clinical situations.     eGFR's persistently <90 mL/min signify possible Chronic Kidney     Disease.  CBC WITH DIFFERENTIAL     Status: Abnormal   Collection Time    05/20/13  5:25 PM      Result Value Range   WBC 3.5 (*) 4.0 - 10.5 K/uL   RBC 3.60 (*) 4.22 - 5.81 MIL/uL   Hemoglobin 10.9 (*) 13.0 - 17.0 g/dL   HCT 53.6 (*) 64.4 - 03.4 %   MCV 91.4  78.0 - 100.0 fL   MCH 30.3  26.0 - 34.0 pg   MCHC 33.1  30.0 - 36.0 g/dL   RDW 74.2  59.5 - 63.8 %   Platelets 145 (*) 150 - 400 K/uL   Neutrophils Relative % 72  43 - 77 %   Neutro Abs 2.5  1.7 - 7.7 K/uL   Lymphocytes Relative 14  12 - 46 %   Lymphs Abs 0.5 (*) 0.7 - 4.0 K/uL   Monocytes Relative 11  3 - 12 %   Monocytes Absolute 0.4  0.1 - 1.0 K/uL   Eosinophils Relative 3  0 - 5 %   Eosinophils Absolute 0.1  0.0 - 0.7 K/uL   Basophils Relative 0  0 - 1 %   Basophils Absolute 0.0  0.0 - 0.1 K/uL  TROPONIN I     Status: None    Collection Time    05/20/13  5:25 PM      Result Value Range   Troponin I <0.30  <0.30 ng/mL   Comment:            Due to the release kinetics of cTnI,     a negative result within the first hours     of the onset of symptoms does not rule  out     myocardial infarction with certainty.     If myocardial infarction is still suspected,     repeat the test at appropriate intervals.  RAPID HIV SCREEN [facility])     Status: Abnormal   Collection Time    05/20/13  5:25 PM      Result Value Range   SUDS Rapid HIV Screen Reactive (*) NON REACTIVE   Comment: RESULT REPEATED AND VERIFIED                This test is not diagnostic     of HIV infection and must be     sent for confirmatory testing     by [facility] before rendering     a diagnosis of positivity for     HIV infection.     Sent for confirmatory testing.     CORRECTED ON 08/14 AT 1835: PREVIOUSLY REPORTED AS Reactive    Radiology Reports: Dg Chest 2 View  05/20/2013   *RADIOLOGY REPORT*  Clinical Data: Chest pain, weakness  CHEST - 2 VIEW  Comparison: Portable chest x-ray of 01/04/2012  Findings: No active infiltrate or effusion is seen.  Mediastinal contours appear stable.  The heart is within normal limits in size. No bony abnormality is seen.  IMPRESSION: No active lung disease.   Original Report Authenticated By: Dwyane Dee, M.D.    Electrocardiogram: Sinus tachycardia, and 12 beats per minute. Normal axis. Intervals are normal. No Q waves. No concerning ST or T-wave changes are noted.  Problem List  Principal Problem:   Chest pain on exertion Active Problems:   HTN (hypertension)   Orthostatic hypotension   Weight loss   Bipolar disorder   GERD (gastroesophageal reflux disease)   Assessment: This is a 42 year old, Caucasian male, with a past with history of GERD and hypertension, presents with exertional chest pain. He was noted to be orthostatic in the emergency department and has improved with IV fluids. His  risk factor for heart disease includes a history of hypertension. EKG did not show any specific ischemic changes. VTE is less likely considering his symptoms. GERD is a possibility as a cause of his chest pain. 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 Confirmatory tests are pending.  Plan: #1 exertional chest pain: We will go ahead and observe him in the hospital. Serial troponins will be obtained. Echocardiogram will be ordered. EKG will be repeated. Continue with PPI. He will likely need to have some form of noninvasive testing to begin with. This can be determined in the morning and can be pursued as an outpatient if he remains stable.  #2 positive HIV testing: Consummate retesting is pending. We'll also get CD4 counts and viral load. He will need to be set up with the infectious disease specialist who manages HIV. Patient is aware of his results. He said that he had a very promiscuous lifestyle in the 90s. However, hasn't had any since sexual intercourse in the last many years.  #3 orthostatic hypotension: Will be given IV fluids. Orthostatics will be repeated in the morning.  #4 history of hypertension: Because of his low blood pressure we will hold his beta blocker.  #5 history of GERD: Continue with the PPI.  #6 history of bipolar disorder: Continue with Seroquel.  Check Anemia panel.   DVT Prophylaxis: Lovenox Code Status: Full code Family Communication:  Discussed with the patient  Disposition Plan: Observe on telemetry  Further management decisions will depend on results of  further testing and patient's response to treatment.  [facility]  [facility] Pager [phone]  If 7PM-7AM, please contact night-coverage [email or web address] Password [facility]  05/20/2013, 7:51 PM

## 2013-05-21 DIAGNOSIS — B20 Human immunodeficiency virus [HIV] disease: Secondary | ICD-10-CM | POA: Insufficient documentation

## 2013-05-21 DIAGNOSIS — R079 Chest pain, unspecified: Secondary | ICD-10-CM

## 2013-05-21 DIAGNOSIS — I951 Orthostatic hypotension: Secondary | ICD-10-CM

## 2013-05-21 DIAGNOSIS — I517 Cardiomegaly: Secondary | ICD-10-CM

## 2013-05-21 DIAGNOSIS — I10 Essential (primary) hypertension: Secondary | ICD-10-CM

## 2013-05-21 DIAGNOSIS — E440 Moderate protein-calorie malnutrition: Secondary | ICD-10-CM | POA: Insufficient documentation

## 2013-05-21 DIAGNOSIS — R634 Abnormal weight loss: Secondary | ICD-10-CM

## 2013-05-21 LAB — COMPREHENSIVE METABOLIC PANEL
Albumin: 3.2 g/dL — ABNORMAL LOW (ref 3.5–5.2)
BUN: 11 mg/dL (ref 6–23)
Creatinine, Ser: 0.91 mg/dL (ref 0.50–1.35)
GFR calc Af Amer: >90 mL/min (ref >90)
Glucose, Bld: 90 mg/dL (ref 70–99)
Total Protein: 7.5 g/dL (ref 6.0–8.3)

## 2013-05-21 LAB — IRON AND TIBC
Saturation Ratios: 21 % (ref 20–55)
TIBC: 249 ug/dL (ref 215–435)

## 2013-05-21 LAB — RETICULOCYTES
RBC.: 3.91 MIL/uL — ABNORMAL LOW (ref 4.22–5.81)
Retic Ct Pct: 1 % (ref 0.4–3.1)

## 2013-05-21 LAB — URINALYSIS, ROUTINE W REFLEX MICROSCOPIC
Hgb urine dipstick: NEGATIVE
Nitrite: NEGATIVE
Specific Gravity, Urine: 1.015 (ref 1.005–1.030)
Urobilinogen, UA: 0.2 mg/dL (ref 0.0–1.0)
pH: 7 (ref 5.0–8.0)

## 2013-05-21 LAB — RAPID URINE DRUG SCREEN, HOSP PERFORMED
Benzodiazepines: POSITIVE — AB
Cocaine: NOT DETECTED
Opiates: NOT DETECTED

## 2013-05-21 LAB — CBC
HCT: 36.2 % — ABNORMAL LOW (ref 39.0–52.0)
Hemoglobin: 12 g/dL — ABNORMAL LOW (ref 13.0–17.0)
MCH: 30.7 pg (ref 26.0–34.0)
MCHC: 33.1 g/dL (ref 30.0–36.0)
MCV: 92.6 fL (ref 78.0–100.0)
RDW: 14.8 % (ref 11.5–15.5)

## 2013-05-21 LAB — T-HELPER CELLS (CD4) COUNT (NOT AT ARMC): CD4 % Helper T Cell: 4 % — ABNORMAL LOW (ref 33–55)

## 2013-05-21 LAB — TROPONIN I
Troponin I: <0.3 ng/mL (ref <0.30)
Troponin I: <0.3 ng/mL (ref <0.30)

## 2013-05-21 LAB — FOLATE: Folate: 11.3 ng/mL

## 2013-05-21 LAB — VITAMIN B12: Vitamin B-12: 284 pg/mL (ref 211–911)

## 2013-05-21 MED ORDER — METOPROLOL SUCCINATE ER 25 MG PO TB24
25.0000 mg | ORAL_TABLET | Freq: Every evening | ORAL | Status: DC
  Administered 2013-05-21: 25 mg via ORAL
  Filled 2013-05-21: qty 1

## 2013-05-21 MED ORDER — ENSURE COMPLETE PO LIQD
237.0000 mL | Freq: Two times a day (BID) | ORAL | Status: DC
  Administered 2013-05-21 – 2013-05-22 (×2): 237 mL via ORAL

## 2013-05-21 MED ORDER — POTASSIUM CHLORIDE CRYS ER 20 MEQ PO TBCR
40.0000 meq | EXTENDED_RELEASE_TABLET | Freq: Once | ORAL | Status: AC
  Administered 2013-05-21: 40 meq via ORAL
  Filled 2013-05-21: qty 2

## 2013-05-21 NOTE — Progress Notes (Signed)
UR Chart Review Completed  

## 2013-05-21 NOTE — Clinical Social Work Psychosocial (Signed)
Clinical Social Work Department BRIEF PSYCHOSOCIAL ASSESSMENT 05/21/2013  Patient:  Ryan [name], Ryan [name]     Account Number:  [id number]     Admit date:  05/20/2013  Clinical Social Worker:  Nancie Neas  Date/Time:  05/21/2013 02:10 PM  Referred by:  Physician  Date Referred:  05/21/2013 Referred for  Other - See comment   Other Referral:   new HIV diagnosis   Interview type:  Patient Other interview type:    PSYCHOSOCIAL DATA Living Status:  ALONE Admitted from facility:   Level of care:   Primary support name:  Sharon/Cheryl Primary support relationship to patient:  SIBLING Degree of support available:   supportive per pt    CURRENT CONCERNS Current Concerns  Adjustment to Illness   Other Concerns:    SOCIAL WORK ASSESSMENT / PLAN CSW met with pt at bedside following referral for new HIV diagnosis. Pt alert and oriented and open to talking to CSW. He reports he currently lives alone, but does have his 78 year old son with him some. His best support are his two sisters, Jasmine December and Elnita Maxwell. Pt indicates he came to ED for chest pain, but was most concerned about his symptoms he has been experiencing over the last year and a half. 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 He was asked to leave in July due to his many sick days. He reports he has lost about 70 pounds in the last 9 months. Although he was unable to afford food for some of this time, pt seemed to understand that something else was going on. He was diagnosed with HIV yesterday. Pt reports he is still processing this information, which is understandable. He has told his sisters who have been supportive. Pt states he is going to be evicted from his rental of 17 years soon and he plans to move in with Shirley. He feels this will be good for him in order to have additional support. Pt understands he will need to follow up with ID. He is also open to calling Homecare providers which  is an agency serving [employer] residents per [employer] in [place].    Pt also has diagnosis of Bipolar disorder. He states he was diagnosed around age 42. Pt is prescribed Seroquel and Ativan at home. He feels he is not prescribed enough Ativan and has discussed with his PCP. Pt denies any psychiatry or therapy in the past and does not feel this is necessary.   Assessment/plan status:  No Further Intervention Required Other assessment/ plan:   Information/referral to community resources:   Homecare Providers    PATIENT'S/FAMILY'S RESPONSE TO PLAN OF CARE: Pt appreciative of CSW visit and will follow up with ID and Homecare Providers. He feels he has adequate support at this time with his sisters. CSW left contact information in pt's room for Homecare Providers. Will sign off but can be reconsulted if needed.       Derenda Fennel, [place] [phone]

## 2013-05-21 NOTE — Progress Notes (Addendum)
INITIAL NUTRITION ASSESSMENT  DOCUMENTATION CODES Per approved criteria  -Non-severe (moderate) malnutrition in the context of chronic illness   INTERVENTION:  Ensure Complete po BID, each supplement provides 350 kcal and 13 grams of protein.  Social work consult to potentially assist with food resouces  RD will follow for nutrition needs.  NUTRITION DIAGNOSIS: Inadequate oral intake related to lack of appetite as evidenced by 13#, 8% wt loss <90days.  Goal: Pt to meet >/= 90% of their estimated nutrition needs   Monitor:  Po intake, labs and wt trends  Reason for Assessment: Malnutrition Screen Score= 4  42 y.o. male  ASSESSMENT: Patient Active Problem List   Diagnosis Date Noted  . Orthostatic hypotension 05/20/2013  . Chest pain on exertion 05/20/2013  . Weight loss 05/20/2013  . Bipolar disorder 05/20/2013  . GERD (gastroesophageal reflux disease) 05/20/2013  . Hypotension 01/03/2012  . ARF (acute renal failure) 01/03/2012  . Rash 01/03/2012  . HTN (hypertension) 01/03/2012   Pt has significant, unplanned wt loss of 13#,8% past 70 days and overall his wt has decreased 44#, 22% since March 2013 per hospital records. C/o increased weakness, decreased oral intake. Diet hx reviewed. Recently diagnosed with HIV which may be a contributing factor. He reports sometimes he misses meals due to lack of access to food. Not working currently. Therefore his financial circumstances may also be contributing to his inadequate nutrition intake.  He meets criteria for moderate malnutrition given his energy intake <75% for >/= 1 month and 8% wt loss < 90 days.  Height: Ht Readings from Last 1 Encounters:  05/20/13 5\' 9"  (1.753 m)    Weight: Wt Readings from Last 1 Encounters:  05/20/13 156 lb 4.9 oz (70.9 kg)    Ideal Body Weight: 160# (72.7kg)  % Ideal Body Weight: 98%   Wt Readings from Last 10 Encounters:  05/20/13 156 lb 4.9 oz (70.9 kg)  03/11/13 169 lb (76.658 kg)   01/09/12 180 lb (81.647 kg)  01/04/12 200 lb 6.4 oz (90.9 kg)    Usual Body Weight: 169#  % Usual Body Weight: 92%  BMI:  Body mass index is 23.07 kg/(m^2).normal  Estimated Nutritional Needs: Kcal:2130-2343 Protein:85-99 gr  Fluid: >2100 ml/day  Skin: No issues noted  Diet Order: Cardiac  EDUCATION NEEDS: -No education needs identified at this time   Intake/Output Summary (Last 24 hours) at 05/21/13 0411 Last data filed at 05/20/13 1931  Gross per 24 hour  Intake   1000 ml  Output      0 ml  Net   1000 ml    Last BM: 05/19/13   Labs:   Recent Labs Lab 05/20/13 1725  NA 136  K 3.8  CL 101  CO2 26  BUN 12  CREATININE 0.99  CALCIUM 8.5  GLUCOSE 84    CBG (last 3)  No results found for this basename: GLUCAP,  in the last 72 hours  Scheduled Meds: . aspirin EC  81 mg Oral Daily  . docusate sodium  100 mg Oral BID  . enoxaparin (LOVENOX) injection  40 mg Subcutaneous Q24H  . multivitamin with minerals  1 tablet Oral Daily  . pantoprazole  80 mg Oral Daily  . QUEtiapine  300 mg Oral QHS  . sodium chloride  3 mL Intravenous Q12H  . thiamine  100 mg Oral Daily    Continuous Infusions: . sodium chloride 100 mL/hr at 05/21/13 0244    Past Medical History  Diagnosis Date  . Hypertension   .  Bipolar disorder   . Depressed     Past Surgical History  Procedure Laterality Date  . Finger surgery      Royann Shivers MS,RD,LDN,CSG Office: #[phone] Pager: [phone]

## 2013-05-21 NOTE — Progress Notes (Signed)
Patient ID: CHRLES SELLEY, male   DOB: 04-Feb-1971, 42 y.o.   MRN: [id number]   CARDIOLOGY CONSULT NOTE  Patient ID: KORI COLIN MRN: [id number], DOB/AGE: 15-Nov-1970   Admit date: 05/20/2013 Date of Consult: 05/21/2013   Primary Physician: Josue Hector, MD Primary Cardiologist: none  Pt. Profile  42 year old white male presents with chest pain, tachycardia palpitations, and lightheadedness. I was asked by Dr. Waymon Amato to make sure this is not cardiac.  Problem List  Past Medical History  Diagnosis Date  . Hypertension   . Bipolar disorder   . Depressed     Past Surgical History  Procedure Laterality Date  . Finger surgery       Allergies  Allergies  Allergen Reactions  . Levaquin [Levofloxacin In D5w]   . Sulfa Antibiotics Rash    HPI   The patient has not been doing well in general. 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 He has not been eating well. He apparently has lost about 70 pounds per the chart.  Over the last couple weeks, he has noted lightheadedness with standing. He has sure if he's really had syncope. He's also had some chest fullness with activity. He also has a shortness of breath getting around.  He denies orthopnea, PND or edema. He also denies any pleuritic chest pain or change in chest pain based on position. He denies any nausea vomiting or other associated symptoms.  He states his urine has been very dark. He denies any bleeding.  Cardiac risk factors include only hypertension.  Inpatient Medications  . aspirin EC  81 mg Oral Daily  . docusate sodium  100 mg Oral BID  . enoxaparin (LOVENOX) injection  40 mg Subcutaneous Q24H  . feeding supplement  237 mL Oral BID BM  . multivitamin with minerals  1 tablet Oral Daily  . pantoprazole  80 mg Oral Daily  . QUEtiapine  300 mg Oral QHS  . sodium chloride  3 mL Intravenous Q12H  . thiamine  100 mg Oral Daily    Family History Family History  Problem  Relation Age of Onset  . Esophageal cancer Father      Social History History   Social History  . Marital Status: Single    Spouse Name: N/A    Number of Children: N/A  . Years of Education: N/A   Occupational History  . Not on file.   Social History Main Topics  . Smoking status: Never Smoker   . Smokeless tobacco: Not on file  . Alcohol Use: No  . Drug Use: No     Comment: presription pills - history but has not done any in 1 year  . Sexual Activity: Not on file   Other Topics Concern  . Not on file   Social History Narrative  . No narrative on file     Review of Systems  General:  No chills, fever, night sweats.  Cardiovascular:  No chest pain,  edema, orthopnea, palpitations, paroxysmal nocturnal dyspnea. Dermatological: Rash on legs, scaly lesions lesions on leg Respiratory: No cough, dyspnea Urologic: No hematuria, dysuria Abdominal:   No nausea, vomiting, diarrhea, bright red blood per rectum, melena, or hematemesis Neurologic:  No visual changes, wkns, changes in mental status. All other systems reviewed and are otherwise negative except as noted above.  Physical Exam  Blood pressure 139/83, pulse 77, temperature 98.5 F (36.9 C), temperature source Oral, resp. rate 17, height 5\' 9"  (  1.753 m), weight 156 lb 4.9 oz (70.9 kg), SpO2 100.00%.  General: Pleasant, NAD, chronically ill-appearing, pale Psych: Normal affect. Neuro: Alert and oriented X 3. Moves all extremities spontaneously. HEENT: Normal  Neck: Supple without bruits or JVD. Lungs:  Resp regular and unlabored, CTA. Heart: RRR no s3, s4, or murmurs. Abdomen: Soft, non-tender, non-distended, BS + x 4.  Extremities: No clubbing, cyanosis or edema. DP/PT/Radials 2+ and equal bilaterally. Scaly lesions on legs  Labs   Recent Labs  05/20/13 1725 05/20/13 2340 05/21/13 0635  TROPONINI <0.30 <0.30 <0.30   Lab Results  Component Value Date   WBC 3.4* 05/21/2013   HGB 12.0* 05/21/2013   HCT  36.2* 05/21/2013   MCV 92.6 05/21/2013   PLT 140* 05/21/2013    Recent Labs Lab 05/21/13 0635  NA 143  K 3.4*  CL 108  CO2 24  BUN 11  CREATININE 0.91  CALCIUM 8.5  PROT 7.5  BILITOT 0.4  ALKPHOS 63  ALT 12  AST 23  GLUCOSE 90   No results found for this basename: CHOL, HDL, LDLCALC, TRIG   No results found for this basename: DDIMER    Radiology/Studies  Dg Chest 2 View  05/20/2013   *RADIOLOGY REPORT*  Clinical Data: Chest pain, weakness  CHEST - 2 VIEW  Comparison: Portable chest x-ray of 01/04/2012  Findings: No active infiltrate or effusion is seen.  Mediastinal contours appear stable.  The heart is within normal limits in size. No bony abnormality is seen.  IMPRESSION: No active lung disease.   Original Report Authenticated By: Dwyane Dee, M.D.    ECG I reviewed all 3 EKGs. Other than sinus tachycardia, they are normal.  ASSESSMENT AND PLAN  This patient is chronically ill most likely from HIV. He has lost a tremendous amount of weight and is eating and drinking fluids poorly. He was very dehydrated on admission and is improving with intravenous fluids. His chest discomfort seems to be related to getting up is probably from sinus tachycardia to maintain blood pressure. It does not sound ischemic. EKGs are unremarkable except for sinus tachycardia. Echocardiogram is pending. I do not think this is pericardial disease but need to rule out effusion.  He needs further evaluation and treatment of his HIV. He needs major nutritional support. I would not recommend any further cardiac workup unless his echocardiogram is abnormal.   Signed, Valera Castle, MD 05/21/2013, 12:54 PM

## 2013-05-21 NOTE — Progress Notes (Addendum)
[facility] PROGRESS NOTE  Ryan [name] ZOX:[id number] DOB: 1971-06-19 DOA: 05/20/2013 PCP: Josue Hector, MD  Brief narrative 42 year old male patient with history of bipolar disorder, hypertension, GERD, was admitted to the hospital on 05/20/2013 with approximately 1-2 months history of intermittent exertional anterior chest pressure with associated dyspnea and palpitations. Symptoms are brought on by walking and relieved after resting for some time. He does not climb stairs. No radiation of pain. Apparently has had episode of lightheadedness and passing out. No prior cardiac workup. No family history of heart disease. Profound weight loss of approximately 70 pounds in the last 6 months. Patient was orthostatic in the ED. EKG without ischemic changes. Preliminary HIV antibody screen was positive.  Assessment/Plan: 1. Exertional chest pain: Troponins x 3 negative. Cardiology input appreciated-indicate that his chest discomfort may be related to tachycardia and not ischemia and do not recommend any further workup if echocardiogram is normal. Echo: Mild LVH and LVEF 55-60%. Grade 1 diastolic dysfunction. No pericardial effusion. Currently without chest pain. 2. Orthostatic hypotension: Likely secondary to dehydration. Seems to have resolved. DC IV fluids and monitor. 3. Profound weight loss/HIV +: Follow viral load. CD4 count: 30 . Will need outpatient ID consultation. TSH: 5.433. Check free T3 and free T4. 4. Hypertension: Controlled. Beta blockers were held on admission-will resume it has the dose to avoid rebound tachycardia. 5. Mild hypokalemia: Replete and follow BMP. 6. Mild pancytopenia:? Secondary to B12 deficiency. Follow CBCs. May consider B12 orally on discharge. 7. History of GERD: Continue PPIs. 8. History of bipolar disorder: Continue Seroquel 9. Psoriasis  Code Status: Full Family Communication: None Disposition Plan: Home when medically  stable.   Consultants:  Cardiology  Procedures:  None  Antibiotics:  None   HPI/Subjective: No chest pain this morning.  Objective: Filed Vitals:   05/21/13 0500 05/21/13 0505 05/21/13 0510 05/21/13 1415  BP: 114/79 144/81 139/83 108/70  Pulse: 77 77 77 106  Temp: 98.5 F (36.9 C)   98.2 F (36.8 C)  TempSrc: Oral     Resp: 18 20 17 18   Height:      Weight:      SpO2: 100% 100% 100% 100%    Intake/Output Summary (Last 24 hours) at 05/21/13 1728 Last data filed at 05/20/13 1931  Gross per 24 hour  Intake   1000 ml  Output      0 ml  Net   1000 ml   Filed Weights   05/20/13 1620 05/20/13 2025  Weight: 72.576 kg (160 lb) 70.9 kg (156 lb 4.9 oz)    Exam:   General exam: Comfortable.  Respiratory system: Clear. No increased work of breathing.  Cardiovascular system: S1 & S2 heard, RRR. No JVD, murmurs, gallops, clicks or pedal edema. Telemetry: Sinus rhythm.  Gastrointestinal system: Abdomen is nondistended, soft and nontender. Normal bowel sounds heard.  Central nervous system: Alert and oriented. No focal neurological deficits.  Extremities: Symmetric 5 x 5 power. Skin changes from psoriasis  Data Reviewed: Basic Metabolic Panel:  Recent Labs Lab 05/20/13 1725 05/21/13 0635  NA 136 143  K 3.8 3.4*  CL 101 108  CO2 26 24  GLUCOSE 84 90  BUN 12 11  CREATININE 0.99 0.91  CALCIUM 8.5 8.5   Liver Function Tests:  Recent Labs Lab 05/20/13 1936 05/21/13 0635  AST 25 23  ALT 13 12  ALKPHOS 60 63  BILITOT 0.3 0.4  PROT 7.5 7.5  ALBUMIN 3.1* 3.2*   No results found for  this basename: LIPASE, AMYLASE,  in the last 168 hours No results found for this basename: AMMONIA,  in the last 168 hours CBC:  Recent Labs Lab 05/20/13 1725 05/21/13 0635  WBC 3.5* 3.4*  NEUTROABS 2.5  --   HGB 10.9* 12.0*  HCT 32.9* 36.2*  MCV 91.4 92.6  PLT 145* 140*   Cardiac Enzymes:  Recent Labs Lab 05/20/13 1725 05/20/13 2340 05/21/13 0635   TROPONINI <0.30 <0.30 <0.30   BNP (last 3 results) No results found for this basename: PROBNP,  in the last 8760 hours CBG: No results found for this basename: GLUCAP,  in the last 168 hours  Recent Results (from the past 240 hour(s))  MRSA PCR SCREENING     Status: None   Collection Time    05/20/13  9:30 PM      Result Value Range Status   MRSA by PCR NEGATIVE  NEGATIVE Final   Comment:            The GeneXpert MRSA Assay (FDA     approved for NASAL specimens     only), is one component of a     comprehensive MRSA colonization     surveillance program. It is not     intended to diagnose MRSA     infection nor to guide or     monitor treatment for     MRSA infections.     Studies: Dg Chest 2 View  05/20/2013   *RADIOLOGY REPORT*  Clinical Data: Chest pain, weakness  CHEST - 2 VIEW  Comparison: Portable chest x-ray of 01/04/2012  Findings: No active infiltrate or effusion is seen.  Mediastinal contours appear stable.  The heart is within normal limits in size. No bony abnormality is seen.  IMPRESSION: No active lung disease.   Original Report Authenticated By: Dwyane Dee, M.D.     Additional labs:   Scheduled Meds: . aspirin EC  81 mg Oral Daily  . docusate sodium  100 mg Oral BID  . enoxaparin (LOVENOX) injection  40 mg Subcutaneous Q24H  . feeding supplement  237 mL Oral BID BM  . multivitamin with minerals  1 tablet Oral Daily  . pantoprazole  80 mg Oral Daily  . QUEtiapine  300 mg Oral QHS  . sodium chloride  3 mL Intravenous Q12H  . thiamine  100 mg Oral Daily   Continuous Infusions: . sodium chloride 100 mL/hr at 05/21/13 1332    Principal Problem:   Chest pain on exertion Active Problems:   HTN (hypertension)   Orthostatic hypotension   Weight loss   Bipolar disorder   GERD (gastroesophageal reflux disease)   Malnutrition of moderate degree    Time spent: 35 minutes.    [facility]  [facility] Pager [phone].   If 8PM-8AM,  please contact night-coverage at [email or web address], password [facility] 05/21/2013, 5:28 PM  LOS: 1 day

## 2013-05-21 NOTE — Progress Notes (Signed)
*  PRELIMINARY RESULTS* Echocardiogram 2D Echocardiogram has been performed.  Ryan [name] 05/21/2013, 10:10 AM 

## 2013-05-22 LAB — BASIC METABOLIC PANEL
BUN: 10 mg/dL (ref 6–23)
Calcium: 9.6 mg/dL (ref 8.4–10.5)
Creatinine, Ser: 0.91 mg/dL (ref 0.50–1.35)
GFR calc non Af Amer: >90 mL/min (ref >90)
Glucose, Bld: 87 mg/dL (ref 70–99)

## 2013-05-22 LAB — CBC
HCT: 34.3 % — ABNORMAL LOW (ref 39.0–52.0)
Hemoglobin: 11.3 g/dL — ABNORMAL LOW (ref 13.0–17.0)
MCH: 30.5 pg (ref 26.0–34.0)
MCHC: 32.9 g/dL (ref 30.0–36.0)
RDW: 14.5 % (ref 11.5–15.5)

## 2013-05-22 MED ORDER — SODIUM CHLORIDE 0.9 % IV SOLN: INTRAVENOUS | Status: DC

## 2013-05-22 MED ORDER — METOPROLOL SUCCINATE ER 25 MG PO TB24: 25.0000 mg | ORAL_TABLET | Freq: Every evening | ORAL | Status: DC

## 2013-05-22 MED ORDER — THIAMINE HCL 100 MG PO TABS: 100.0000 mg | ORAL_TABLET | Freq: Every day | ORAL | Status: DC

## 2013-05-22 NOTE — Progress Notes (Signed)
IV removed, site WNL.  Pt given d/c instructions and new prescriptions.  Discussed home care with patient and discussed home medications, patient verbalizes understanding, teachback completed. F/U appointment will be made by patient on Monday when offices open, phone numbers given to pt.  Pt states he will keep appointment. Medications stored in pharmacy returned to pt at this time. Pt is stable at this time, waiting on ride home.

## 2013-05-22 NOTE — Progress Notes (Signed)
Physician Discharge Summary  Ryan [name] MRN: [id number] DOB/AGE: 42/09/1971 42 y.o.  PCP: Josue Hector, MD   Admit date: 05/20/2013 Discharge date: 05/22/2013  Discharge Diagnoses:      Chest pain on exertion Active Problems:   HTN (hypertension)   Orthostatic hypotension   Weight loss   Bipolar disorder   GERD (gastroesophageal reflux disease)   Malnutrition of moderate degree     Medication List         DEXILANT 60 MG capsule  Generic drug:  dexlansoprazole  Take 60 mg by mouth every evening.     LORazepam 0.5 MG tablet  Commonly known as:  ATIVAN  Take 0.5 mg by mouth 3 (three) times daily as needed for anxiety.     metoprolol succinate 25 MG 24 hr tablet  Commonly known as:  TOPROL-XL  Take 1 tablet (25 mg total) by mouth every evening.     QUEtiapine 300 MG tablet  Commonly known as:  SEROQUEL  Take 300 mg by mouth at bedtime.     thiamine 100 MG tablet  Take 1 tablet (100 mg total) by mouth daily.        Discharge Condition: *  Disposition: 01-Home or Self Care   Consults:    Significant Diagnostic Studies: Dg Chest 2 View  05/20/2013   *RADIOLOGY REPORT*  Clinical Data: Chest pain, weakness  CHEST - 2 VIEW  Comparison: Portable chest x-ray of 01/04/2012  Findings: No active infiltrate or effusion is seen.  Mediastinal contours appear stable.  The heart is within normal limits in size. No bony abnormality is seen.  IMPRESSION: No active lung disease.   Original Report Authenticated By: Dwyane Dee, M.D.    LV EF: 55% - 60%  ------------------------------------------------------------ Indications: Chest pain 786.51.  ------------------------------------------------------------ History: PMH: Chest pain. PMH: hypotension, acute renal failure, GERD Risk factors: Hypertension.  ------------------------------------------------------------ Study Conclusions  Left ventricle: The cavity size was normal. Wall thickness was  increased in a pattern of mild LVH. Systolic function was normal. The estimated ejection fraction was in the range of 55% to 60%. Doppler parameters are consistent with abnormal left ventricular relaxation (grade 1 diastolic dysfunction).     Microbiology: Recent Results (from the past 240 hour(s))  MRSA PCR SCREENING     Status: None   Collection Time    05/20/13  9:30 PM      Result Value Range Status   MRSA by PCR NEGATIVE  NEGATIVE Final   Comment:            The GeneXpert MRSA Assay (FDA     approved for NASAL specimens     only), is one component of a     comprehensive MRSA colonization     surveillance program. It is not     intended to diagnose MRSA     infection nor to guide or     monitor treatment for     MRSA infections.     Labs: Results for orders placed during the hospital encounter of 05/20/13 (from the past 48 hour(s))  BASIC METABOLIC PANEL     Status: None   Collection Time    05/20/13  5:25 PM      Result Value Range   Sodium 136  135 - 145 mEq/L   Potassium 3.8  3.5 - 5.1 mEq/L   Chloride 101  96 - 112 mEq/L   CO2 26  19 - 32 mEq/L   Glucose, Bld 84  70 -  99 mg/dL   BUN 12  6 - 23 mg/dL   Creatinine, Ser 1.61  0.50 - 1.35 mg/dL   Calcium 8.5  8.4 - 09.6 mg/dL   GFR calc non Af Amer >90  >90 mL/min   GFR calc Af Amer >90  >90 mL/min   Comment: (NOTE)     The eGFR has been calculated using the CKD EPI equation.     This calculation has not been validated in all clinical situations.     eGFR's persistently <90 mL/min signify possible Chronic Kidney     Disease.  CBC WITH DIFFERENTIAL     Status: Abnormal   Collection Time    05/20/13  5:25 PM      Result Value Range   WBC 3.5 (*) 4.0 - 10.5 K/uL   RBC 3.60 (*) 4.22 - 5.81 MIL/uL   Hemoglobin 10.9 (*) 13.0 - 17.0 g/dL   HCT 04.5 (*) 40.9 - 81.1 %   MCV 91.4  78.0 - 100.0 fL   MCH 30.3  26.0 - 34.0 pg   MCHC 33.1  30.0 - 36.0 g/dL   RDW 91.4  78.2 - 95.6 %   Platelets 145 (*) 150 - 400 K/uL    Neutrophils Relative % 72  43 - 77 %   Neutro Abs 2.5  1.7 - 7.7 K/uL   Lymphocytes Relative 14  12 - 46 %   Lymphs Abs 0.5 (*) 0.7 - 4.0 K/uL   Monocytes Relative 11  3 - 12 %   Monocytes Absolute 0.4  0.1 - 1.0 K/uL   Eosinophils Relative 3  0 - 5 %   Eosinophils Absolute 0.1  0.0 - 0.7 K/uL   Basophils Relative 0  0 - 1 %   Basophils Absolute 0.0  0.0 - 0.1 K/uL  TROPONIN I     Status: None   Collection Time    05/20/13  5:25 PM      Result Value Range   Troponin I <0.30  <0.30 ng/mL   Comment:            Due to the release kinetics of cTnI,     a negative result within the first hours     of the onset of symptoms does not rule out     myocardial infarction with certainty.     If myocardial infarction is still suspected,     repeat the test at appropriate intervals.  RAPID HIV SCREEN [facility])     Status: Abnormal   Collection Time    05/20/13  5:25 PM      Result Value Range   SUDS Rapid HIV Screen Reactive (*) NON REACTIVE   Comment: RESULT REPEATED AND VERIFIED                This test is not diagnostic     of HIV infection and must be     sent for confirmatory testing     by Western Blot before rendering     a diagnosis of positivity for     HIV infection.     Sent for confirmatory testing.     CORRECTED ON 08/14 AT 1835: PREVIOUSLY REPORTED AS Reactive  HEPATIC FUNCTION PANEL     Status: Abnormal   Collection Time    05/20/13  7:36 PM      Result Value Range   Total Protein 7.5  6.0 - 8.3 g/dL   Albumin 3.1 (*) 3.5 - 5.2 g/dL  AST 25  0 - 37 U/L   ALT 13  0 - 53 U/L   Alkaline Phosphatase 60  39 - 117 U/L   Total Bilirubin 0.3  0.3 - 1.2 mg/dL   Bilirubin, Direct <1.6  0.0 - 0.3 mg/dL   Indirect Bilirubin NOT CALCULATED  0.3 - 0.9 mg/dL  MRSA PCR SCREENING     Status: None   Collection Time    05/20/13  9:30 PM      Result Value Range   MRSA by PCR NEGATIVE  NEGATIVE   Comment:            The GeneXpert MRSA Assay (FDA     approved for NASAL specimens      only), is one component of a     comprehensive MRSA colonization     surveillance program. It is not     intended to diagnose MRSA     infection nor to guide or     monitor treatment for     MRSA infections.  TROPONIN I     Status: None   Collection Time    05/20/13 11:40 PM      Result Value Range   Troponin I <0.30  <0.30 ng/mL   Comment:            Due to the release kinetics of cTnI,     a negative result within the first hours     of the onset of symptoms does not rule out     myocardial infarction with certainty.     If myocardial infarction is still suspected,     repeat the test at appropriate intervals.  CBC     Status: Abnormal   Collection Time    05/21/13  6:35 AM      Result Value Range   WBC 3.4 (*) 4.0 - 10.5 K/uL   RBC 3.91 (*) 4.22 - 5.81 MIL/uL   Hemoglobin 12.0 (*) 13.0 - 17.0 g/dL   HCT 10.9 (*) 60.4 - 54.0 %   MCV 92.6  78.0 - 100.0 fL   MCH 30.7  26.0 - 34.0 pg   MCHC 33.1  30.0 - 36.0 g/dL   RDW 98.1  19.1 - 47.8 %   Platelets 140 (*) 150 - 400 K/uL  COMPREHENSIVE METABOLIC PANEL     Status: Abnormal   Collection Time    05/21/13  6:35 AM      Result Value Range   Sodium 143  135 - 145 mEq/L   Comment: DELTA CHECK NOTED   Potassium 3.4 (*) 3.5 - 5.1 mEq/L   Chloride 108  96 - 112 mEq/L   CO2 24  19 - 32 mEq/L   Glucose, Bld 90  70 - 99 mg/dL   BUN 11  6 - 23 mg/dL   Creatinine, Ser 2.95  0.50 - 1.35 mg/dL   Calcium 8.5  8.4 - 62.1 mg/dL   Total Protein 7.5  6.0 - 8.3 g/dL   Albumin 3.2 (*) 3.5 - 5.2 g/dL   AST 23  0 - 37 U/L   ALT 12  0 - 53 U/L   Alkaline Phosphatase 63  39 - 117 U/L   Total Bilirubin 0.4  0.3 - 1.2 mg/dL   GFR calc non Af Amer >90  >90 mL/min   GFR calc Af Amer >90  >90 mL/min   Comment: (NOTE)     The eGFR has been calculated using the CKD EPI equation.  This calculation has not been validated in all clinical situations.     eGFR's persistently <90 mL/min signify possible Chronic Kidney     Disease.  VITAMIN  B12     Status: None   Collection Time    05/21/13  6:35 AM      Result Value Range   Vitamin B-12 284  211 - 911 pg/mL   Comment: Performed at [facility]  FOLATE     Status: None   Collection Time    05/21/13  6:35 AM      Result Value Range   Folate 11.3     Comment: (NOTE)     Reference Ranges            Deficient:       0.4 - 3.3 ng/mL            Indeterminate:   3.4 - 5.4 ng/mL            Normal:              > 5.4 ng/mL     Performed at [facility]  IRON AND TIBC     Status: None   Collection Time    05/21/13  6:35 AM      Result Value Range   Iron 52  42 - 135 ug/dL   TIBC 147  829 - 562 ug/dL   Saturation Ratios 21  20 - 55 %   UIBC 197  125 - 400 ug/dL   Comment: Performed at [facility]  FERRITIN     Status: Abnormal   Collection Time    05/21/13  6:35 AM      Result Value Range   Ferritin 1027 (*) 22 - 322 ng/mL   Comment: Performed at [facility]  RETICULOCYTES     Status: Abnormal   Collection Time    05/21/13  6:35 AM      Result Value Range   Retic Ct Pct 1.0  0.4 - 3.1 %   RBC. 3.91 (*) 4.22 - 5.81 MIL/uL   Retic Count, Manual 39.1  19.0 - 186.0 K/uL  T-HELPER CELLS (CD4) COUNT     Status: Abnormal   Collection Time    05/21/13  6:35 AM      Result Value Range   CD4 T Cell Abs 30 (*) 400 - 2700 cmm   CD4 % Helper T Cell 4 (*) 33 - 55 %   Comment: Performed at [facility]  TSH     Status: Abnormal   Collection Time    05/21/13  6:35 AM      Result Value Range   TSH 5.433 (*) 0.350 - 4.500 uIU/mL   Comment: Performed at [facility]  TROPONIN I     Status: None   Collection Time    05/21/13  6:35 AM      Result Value Range   Troponin I <0.30  <0.30 ng/mL   Comment:            Due to the release kinetics of cTnI,     a negative result within the first hours     of the onset of symptoms does not rule out     myocardial infarction with certainty.     If myocardial infarction is  still suspected,     repeat the test at appropriate intervals.  URINALYSIS, ROUTINE W REFLEX MICROSCOPIC     Status: None  Collection Time    05/21/13  9:24 PM      Result Value Range   Color, Urine YELLOW  YELLOW   APPearance CLEAR  CLEAR   Specific Gravity, Urine 1.015  1.005 - 1.030   pH 7.0  5.0 - 8.0   Glucose, UA NEGATIVE  NEGATIVE mg/dL   Hgb urine dipstick NEGATIVE  NEGATIVE   Bilirubin Urine NEGATIVE  NEGATIVE   Ketones, ur NEGATIVE  NEGATIVE mg/dL   Protein, ur NEGATIVE  NEGATIVE mg/dL   Urobilinogen, UA 0.2  0.0 - 1.0 mg/dL   Nitrite NEGATIVE  NEGATIVE   Leukocytes, UA NEGATIVE  NEGATIVE   Comment: MICROSCOPIC NOT DONE ON URINES WITH NEGATIVE PROTEIN, BLOOD, LEUKOCYTES, NITRITE, OR GLUCOSE <1000 mg/dL.  URINE RAPID DRUG SCREEN (HOSP PERFORMED)     Status: Abnormal   Collection Time    05/21/13  9:24 PM      Result Value Range   Opiates NONE DETECTED  NONE DETECTED   Cocaine NONE DETECTED  NONE DETECTED   Benzodiazepines POSITIVE (*) NONE DETECTED   Amphetamines NONE DETECTED  NONE DETECTED   Tetrahydrocannabinol NONE DETECTED  NONE DETECTED   Barbiturates NONE DETECTED  NONE DETECTED   Comment:            DRUG SCREEN FOR MEDICAL PURPOSES     ONLY.  IF CONFIRMATION IS NEEDED     FOR ANY PURPOSE, NOTIFY LAB     WITHIN 5 DAYS.                LOWEST DETECTABLE LIMITS     FOR URINE DRUG SCREEN     Drug Class       Cutoff (ng/mL)     Amphetamine      1000     Barbiturate      200     Benzodiazepine   200     Tricyclics       300     Opiates          300     Cocaine          300     THC              50  CBC     Status: Abnormal   Collection Time    05/22/13  6:02 AM      Result Value Range   WBC 2.5 (*) 4.0 - 10.5 K/uL   RBC 3.71 (*) 4.22 - 5.81 MIL/uL   Hemoglobin 11.3 (*) 13.0 - 17.0 g/dL   HCT 16.1 (*) 09.6 - 04.5 %   MCV 92.5  78.0 - 100.0 fL   MCH 30.5  26.0 - 34.0 pg   MCHC 32.9  30.0 - 36.0 g/dL   RDW 40.9  81.1 - 91.4 %   Platelets 135 (*) 150 -  400 K/uL  BASIC METABOLIC PANEL     Status: None   Collection Time    05/22/13  6:02 AM      Result Value Range   Sodium 140  135 - 145 mEq/L   Potassium 3.5  3.5 - 5.1 mEq/L   Chloride 104  96 - 112 mEq/L   CO2 27  19 - 32 mEq/L   Glucose, Bld 87  70 - 99 mg/dL   BUN 10  6 - 23 mg/dL   Creatinine, Ser 7.82  0.50 - 1.35 mg/dL   Calcium 9.6  8.4 - 95.6 mg/dL   GFR calc non  Af Amer >90  >90 mL/min   GFR calc Af Amer >90  >90 mL/min   Comment: (NOTE)     The eGFR has been calculated using the CKD EPI equation.     This calculation has not been validated in all clinical situations.     eGFR's persistently <90 mL/min signify possible Chronic Kidney     Disease.     Brief narrative  42 year old male patient with history of bipolar disorder, hypertension, GERD, was admitted to the hospital on 05/20/2013 with approximately 1-2 months history of intermittent exertional anterior chest pressure with associated dyspnea and palpitations. Symptoms are brought on by walking and relieved after resting for some time. He does not climb stairs. No radiation of pain. Apparently has had episode of lightheadedness and passing out. No prior cardiac workup. No family history of heart disease. Profound weight loss of approximately 70 pounds in the last 6 months. Patient was orthostatic in the ED. EKG without ischemic changes. Preliminary HIV antibody screen was positive.  Assessment/Plan:  1. Exertional chest pain: Troponins x 3 negative. Cardiology input appreciated-indicate that his chest discomfort may be related to tachycardia and not ischemia and do not recommend any further workup if echocardiogram is normal. Echo: Mild LVH and LVEF 55-60%. Grade 1 diastolic dysfunction. No pericardial effusion. Currently without chest pain. Will obtain a d-dimer. If negative no further workup indicated if positive the patient will have a CT of the chest 2. Orthostatic hypotension: Likely secondary to dehydration. Seems to  have resolved.  3. Profound weight loss/HIV +: Follow viral load. CD4 count: 30 . Will need outpatient ID consultation. TSH: 5.433. Check free T3 and free T4. 4. Hypertension: Controlled. Beta blocker resumed at half the dose because of low blood pressure 5. Mild hypokalemia: Repleted will need a repeat BMP in one week 6. 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 History of GERD: Continue PPIs. 8. History of bipolar disorder: Continue Seroquel 9. Psoriasis 10.     Discharge Exam:  Blood pressure 93/60, pulse 75, temperature 97.7 F (36.5 C), temperature source Oral, resp. rate 18, height 5\' 9"  (1.753 m), weight 70.9 kg (156 lb 4.9 oz), SpO2 99.00%.   General exam: Comfortable.  Respiratory system: Clear. No increased work of breathing.  Cardiovascular system: S1 & S2 heard, RRR. No JVD, murmurs, gallops, clicks or pedal edema. Telemetry: Sinus rhythm.  Gastrointestinal system: Abdomen is nondistended, soft and nontender. Normal bowel sounds heard.  Central nervous system: Alert and oriented. No focal neurological deficits.  Extremities: Symmetric 5 x 5 power. Skin changes from psoriasis         Signed: Timika Muench 05/22/2013, 8:10 AM

## 2013-05-24 LAB — HIV-1 RNA QUANT-NO REFLEX-BLD: HIV 1 RNA Quant: 807720 copies/mL — ABNORMAL HIGH (ref <20)

## 2013-05-24 LAB — HIV 1/2 CONFIRMATION

## 2013-05-25 ENCOUNTER — Emergency Department (HOSPITAL_COMMUNITY)
Admission: EM | Admit: 2013-05-25 | Discharge: 2013-05-25 | Discharge status: Against Medical Advice | Payer: Medicaid Other | Attending: Emergency Medicine | Admitting: Emergency Medicine

## 2013-05-25 ENCOUNTER — Encounter (HOSPITAL_COMMUNITY): Payer: Self-pay | Admitting: *Deleted

## 2013-05-25 DIAGNOSIS — I10 Essential (primary) hypertension: Secondary | ICD-10-CM | POA: Insufficient documentation

## 2013-05-25 DIAGNOSIS — Z21 Asymptomatic human immunodeficiency virus [HIV] infection status: Secondary | ICD-10-CM | POA: Insufficient documentation

## 2013-05-25 DIAGNOSIS — R5381 Other malaise: Secondary | ICD-10-CM | POA: Insufficient documentation

## 2013-05-25 HISTORY — DX: Human immunodeficiency virus (HIV) disease: B20

## 2013-05-25 HISTORY — DX: Asymptomatic human immunodeficiency virus (hiv) infection status: Z21

## 2013-05-25 NOTE — ED Notes (Signed)
Recent adm , for cp and dx with HIV.  Says frequent falling and weak, confusion.

## 2013-05-26 NOTE — Discharge Summary (Signed)
Physician Discharge Summary  Ryan [name]  MRN: [id number]  DOB/AGE: [id number] 42 y.o.  PCP: Josue Hector, MD  Admit date: 05/20/2013  Discharge date: 05/22/2013    Discharge Diagnoses:  Chest pain on exertion  New diagnosis of HIV  HTN (hypertension)  Orthostatic hypotension  Weight loss  Bipolar disorder  GERD (gastroesophageal reflux disease)  Malnutrition of moderate degree    Medication List         DEXILANT 60 MG capsule    Generic drug: dexlansoprazole    Take 60 mg by mouth every evening.    LORazepam 0.5 MG tablet    Commonly known as: ATIVAN    Take 0.5 mg by mouth 3 (three) times daily as needed for anxiety.    metoprolol succinate 25 MG 24 hr tablet    Commonly known as: TOPROL-XL    Take 1 tablet (25 mg total) by mouth every evening.    QUEtiapine 300 MG tablet    Commonly known as: SEROQUEL    Take 300 mg by mouth at bedtime.    thiamine 100 MG tablet    Take 1 tablet (100 mg total) by mouth daily.      Discharge Condition: *  Disposition: 01-Home or Self Care  Consults:  Significant Diagnostic Studies:  Dg Chest 2 View  05/20/2013 *RADIOLOGY REPORT* Clinical Data: Chest pain, weakness CHEST - 2 VIEW Comparison: Portable chest x-ray of 01/04/2012 Findings: No active infiltrate or effusion is seen. Mediastinal contours appear stable. The heart is within normal limits in size. No bony abnormality is seen. IMPRESSION: No active lung disease. Original Report Authenticated By: Dwyane Dee, M.D.  LV EF: 55% - 60%  ------------------------------------------------------------ Indications: Chest pain 786.51.  ------------------------------------------------------------ History: PMH: Chest pain. PMH: hypotension, acute renal failure, GERD Risk factors: Hypertension.  ------------------------------------------------------------ Study Conclusions  Left ventricle: The cavity size was normal. Wall thickness was increased in a pattern of mild LVH.  Systolic function was normal. The estimated ejection fraction was in the range of 55% to 60%. Doppler parameters are consistent with abnormal left ventricular relaxation (grade 1 diastolic dysfunction).  Microbiology:  Recent Results (from the past 240 hour(s))   MRSA PCR SCREENING Status: None    Collection Time    05/20/13 9:30 PM   Result  Value  Range  Status    MRSA by PCR  NEGATIVE  NEGATIVE  Final    Comment:      The GeneXpert MRSA Assay (FDA     approved for NASAL specimens     only), is one component of a     comprehensive MRSA colonization     surveillance program. It is not     intended to diagnose MRSA     infection nor to guide or     monitor treatment for     MRSA infections.    Labs:  Results for orders placed during the hospital encounter of 05/20/13 (from the past 48 hour(s))   BASIC METABOLIC PANEL Status: None    Collection Time    05/20/13 5:25 PM   Result  Value  Range    Sodium  136  135 - 145 mEq/L    Potassium  3.8  3.5 - 5.1 mEq/L    Chloride  101  96 - 112 mEq/L    CO2  26  19 - 32 mEq/L    Glucose, Bld  84  70 - 99 mg/dL    BUN  12  6 - 23 mg/dL  Creatinine, Ser  0.99  0.50 - 1.35 mg/dL    Calcium  8.5  8.4 - 10.5 mg/dL    GFR calc non Af Amer  >90  >90 mL/min    GFR calc Af Amer  >90  >90 mL/min    Comment:  (NOTE)     The eGFR has been calculated using the CKD EPI equation.     This calculation has not been validated in all clinical situations.     eGFR's persistently <90 mL/min signify possible Chronic Kidney     Disease.   CBC WITH DIFFERENTIAL Status: Abnormal    Collection Time    05/20/13 5:25 PM   Result  Value  Range    WBC  3.5 (*)  4.0 - 10.5 K/uL    RBC  3.60 (*)  4.22 - 5.81 MIL/uL    Hemoglobin  10.9 (*)  13.0 - 17.0 g/dL    HCT  16.1 (*)  09.6 - 52.0 %    MCV  91.4  78.0 - 100.0 fL    MCH  30.3  26.0 - 34.0 pg    MCHC  33.1  30.0 - 36.0 g/dL    RDW  04.5  40.9 - 81.1 %    Platelets  145 (*)  150 - 400 K/uL     Neutrophils Relative %  72  43 - 77 %    Neutro Abs  2.5  1.7 - 7.7 K/uL    Lymphocytes Relative  14  12 - 46 %    Lymphs Abs  0.5 (*)  0.7 - 4.0 K/uL    Monocytes Relative  11  3 - 12 %    Monocytes Absolute  0.4  0.1 - 1.0 K/uL    Eosinophils Relative  3  0 - 5 %    Eosinophils Absolute  0.1  0.0 - 0.7 K/uL    Basophils Relative  0  0 - 1 %    Basophils Absolute  0.0  0.0 - 0.1 K/uL   TROPONIN I Status: None    Collection Time    05/20/13 5:25 PM   Result  Value  Range    Troponin I  <0.30  <0.30 ng/mL    Comment:      Due to the release kinetics of cTnI,     a negative result within the first hours     of the onset of symptoms does not rule out     myocardial infarction with certainty.     If myocardial infarction is still suspected,     repeat the test at appropriate intervals.   RAPID HIV SCREEN [facility]) Status: Abnormal    Collection Time    05/20/13 5:25 PM   Result  Value  Range    SUDS Rapid HIV Screen  Reactive (*)  NON REACTIVE    Comment:  RESULT REPEATED AND VERIFIED         This test is not diagnostic     of HIV infection and must be     sent for confirmatory testing     by Western Blot before rendering     a diagnosis of positivity for     HIV infection.     Sent for confirmatory testing.     CORRECTED ON 08/14 AT 1835: PREVIOUSLY REPORTED AS Reactive   HEPATIC FUNCTION PANEL Status: Abnormal    Collection Time    05/20/13 7:36 PM   Result  Value  Range  Total Protein  7.5  6.0 - 8.3 g/dL    Albumin  3.1 (*)  3.5 - 5.2 g/dL    AST  25  0 - 37 U/L    ALT  13  0 - 53 U/L    Alkaline Phosphatase  60  39 - 117 U/L    Total Bilirubin  0.3  0.3 - 1.2 mg/dL    Bilirubin, Direct  <8.2  0.0 - 0.3 mg/dL    Indirect Bilirubin  NOT CALCULATED  0.3 - 0.9 mg/dL   MRSA PCR SCREENING Status: None    Collection Time    05/20/13 9:30 PM   Result  Value  Range    MRSA by PCR  NEGATIVE  NEGATIVE    Comment:      The GeneXpert MRSA Assay (FDA     approved for NASAL  specimens     only), is one component of a     comprehensive MRSA colonization     surveillance program. It is not     intended to diagnose MRSA     infection nor to guide or     monitor treatment for     MRSA infections.   TROPONIN I Status: None    Collection Time    05/20/13 11:40 PM   Result  Value  Range    Troponin I  <0.30  <0.30 ng/mL    Comment:      Due to the release kinetics of cTnI,     a negative result within the first hours     of the onset of symptoms does not rule out     myocardial infarction with certainty.     If myocardial infarction is still suspected,     repeat the test at appropriate intervals.   CBC Status: Abnormal    Collection Time    05/21/13 6:35 AM   Result  Value  Range    WBC  3.4 (*)  4.0 - 10.5 K/uL    RBC  3.91 (*)  4.22 - 5.81 MIL/uL    Hemoglobin  12.0 (*)  13.0 - 17.0 g/dL    HCT  95.6 (*)  21.3 - 52.0 %    MCV  92.6  78.0 - 100.0 fL    MCH  30.7  26.0 - 34.0 pg    MCHC  33.1  30.0 - 36.0 g/dL    RDW  08.6  57.8 - 46.9 %    Platelets  140 (*)  150 - 400 K/uL   COMPREHENSIVE METABOLIC PANEL Status: Abnormal    Collection Time    05/21/13 6:35 AM   Result  Value  Range    Sodium  143  135 - 145 mEq/L    Comment:  DELTA CHECK NOTED    Potassium  3.4 (*)  3.5 - 5.1 mEq/L    Chloride  108  96 - 112 mEq/L    CO2  24  19 - 32 mEq/L    Glucose, Bld  90  70 - 99 mg/dL    BUN  11  6 - 23 mg/dL    Creatinine, Ser  6.29  0.50 - 1.35 mg/dL    Calcium  8.5  8.4 - 10.5 mg/dL    Total Protein  7.5  6.0 - 8.3 g/dL    Albumin  3.2 (*)  3.5 - 5.2 g/dL    AST  23  0 - 37 U/L    ALT  12  0 - 53 U/L    Alkaline Phosphatase  63  39 - 117 U/L    Total Bilirubin  0.4  0.3 - 1.2 mg/dL    GFR calc non Af Amer  >90  >90 mL/min    GFR calc Af Amer  >90  >90 mL/min    Comment:  (NOTE)     The eGFR has been calculated using the CKD EPI equation.     This calculation has not been validated in all clinical situations.     eGFR's persistently <90 mL/min  signify possible Chronic Kidney     Disease.   VITAMIN B12 Status: None    Collection Time    05/21/13 6:35 AM   Result  Value  Range    Vitamin B-12  284  211 - 911 pg/mL    Comment:  Performed at [facility]   FOLATE Status: None    Collection Time    05/21/13 6:35 AM   Result  Value  Range    Folate  11.3     Comment:  (NOTE)     Reference Ranges     Deficient: 0.4 - 3.3 ng/mL     Indeterminate: 3.4 - 5.4 ng/mL     Normal: > 5.4 ng/mL     Performed at [facility]   IRON AND TIBC Status: None    Collection Time    05/21/13 6:35 AM   Result  Value  Range    Iron  52  42 - 135 ug/dL    TIBC  161  096 - 045 ug/dL    Saturation Ratios  21  20 - 55 %    UIBC  197  125 - 400 ug/dL    Comment:  Performed at [facility]   FERRITIN Status: Abnormal    Collection Time    05/21/13 6:35 AM   Result  Value  Range    Ferritin  1027 (*)  22 - 322 ng/mL    Comment:  Performed at [facility]   RETICULOCYTES Status: Abnormal    Collection Time    05/21/13 6:35 AM   Result  Value  Range    Retic Ct Pct  1.0  0.4 - 3.1 %    RBC.  3.91 (*)  4.22 - 5.81 MIL/uL    Retic Count, Manual  39.1  19.0 - 186.0 K/uL   T-HELPER CELLS (CD4) COUNT Status: Abnormal    Collection Time    05/21/13 6:35 AM   Result  Value  Range    CD4 T Cell Abs  30 (*)  400 - 2700 cmm    CD4 % Helper T Cell  4 (*)  33 - 55 %    Comment:  Performed at [facility]   TSH Status: Abnormal    Collection Time    05/21/13 6:35 AM   Result  Value  Range    TSH  5.433 (*)  0.350 - 4.500 uIU/mL    Comment:  Performed at [facility]   TROPONIN I Status: None    Collection Time    05/21/13 6:35 AM   Result  Value  Range    Troponin I  <0.30  <0.30 ng/mL    Comment:      Due to the release kinetics of cTnI,     a negative result within the first hours     of the onset of symptoms does not rule  out     myocardial infarction with certainty.     If  myocardial infarction is still suspected,     repeat the test at appropriate intervals.   URINALYSIS, ROUTINE W REFLEX MICROSCOPIC Status: None    Collection Time    05/21/13 9:24 PM   Result  Value  Range    Color, Urine  YELLOW  YELLOW    APPearance  CLEAR  CLEAR    Specific Gravity, Urine  1.015  1.005 - 1.030    pH  7.0  5.0 - 8.0    Glucose, UA  NEGATIVE  NEGATIVE mg/dL    Hgb urine dipstick  NEGATIVE  NEGATIVE    Bilirubin Urine  NEGATIVE  NEGATIVE    Ketones, ur  NEGATIVE  NEGATIVE mg/dL    Protein, ur  NEGATIVE  NEGATIVE mg/dL    Urobilinogen, UA  0.2  0.0 - 1.0 mg/dL    Nitrite  NEGATIVE  NEGATIVE    Leukocytes, UA  NEGATIVE  NEGATIVE    Comment:  MICROSCOPIC NOT DONE ON URINES WITH NEGATIVE PROTEIN, BLOOD, LEUKOCYTES, NITRITE, OR GLUCOSE <1000 mg/dL.   URINE RAPID DRUG SCREEN (HOSP PERFORMED) Status: Abnormal    Collection Time    05/21/13 9:24 PM   Result  Value  Range    Opiates  NONE DETECTED  NONE DETECTED    Cocaine  NONE DETECTED  NONE DETECTED    Benzodiazepines  POSITIVE (*)  NONE DETECTED    Amphetamines  NONE DETECTED  NONE DETECTED    Tetrahydrocannabinol  NONE DETECTED  NONE DETECTED    Barbiturates  NONE DETECTED  NONE DETECTED    Comment:      DRUG SCREEN FOR MEDICAL PURPOSES     ONLY. IF CONFIRMATION IS NEEDED     FOR ANY PURPOSE, NOTIFY LAB     WITHIN 5 DAYS.         LOWEST DETECTABLE LIMITS     FOR URINE DRUG SCREEN     Drug Class Cutoff (ng/mL)     Amphetamine 1000     Barbiturate 200     Benzodiazepine 200     Tricyclics 300     Opiates 300     Cocaine 300     THC 50   CBC Status: Abnormal    Collection Time    05/22/13 6:02 AM   Result  Value  Range    WBC  2.5 (*)  4.0 - 10.5 K/uL    RBC  3.71 (*)  4.22 - 5.81 MIL/uL    Hemoglobin  11.3 (*)  13.0 - 17.0 g/dL    HCT  16.1 (*)  09.6 - 52.0 %    MCV  92.5  78.0 - 100.0 fL    MCH  30.5  26.0 - 34.0 pg    MCHC  32.9  30.0 - 36.0 g/dL    RDW  04.5  40.9 - 81.1 %    Platelets  135 (*)   150 - 400 K/uL   BASIC METABOLIC PANEL Status: None    Collection Time    05/22/13 6:02 AM   Result  Value  Range    Sodium  140  135 - 145 mEq/L    Potassium  3.5  3.5 - 5.1 mEq/L    Chloride  104  96 - 112 mEq/L    CO2  27  19 - 32 mEq/L    Glucose, Bld  87  70 - 99 mg/dL  BUN  10  6 - 23 mg/dL    Creatinine, Ser  1.61  0.50 - 1.35 mg/dL    Calcium  9.6  8.4 - 10.5 mg/dL    GFR calc non Af Amer  >90  >90 mL/min    GFR calc Af Amer  >90  >90 mL/min    Comment:  (NOTE)     The eGFR has been calculated using the CKD EPI equation.     This calculation has not been validated in all clinical situations.     eGFR's persistently <90 mL/min signify possible Chronic Kidney     Disease.    Brief narrative  42 year old male patient with history of bipolar disorder, hypertension, GERD, was admitted to the hospital on 05/20/2013 with approximately 1-2 months history of intermittent exertional anterior chest pressure with associated dyspnea and palpitations. Symptoms are brought on by walking and relieved after resting for some time. He does not climb stairs. No radiation of pain. Apparently has had episode of lightheadedness and passing out. No prior cardiac workup. No family history of heart disease. Profound weight loss of approximately 70 pounds in the last 6 months. Patient was orthostatic in the ED. EKG without ischemic changes. Preliminary HIV antibody screen was positive.  Assessment/Plan:  1. Exertional chest pain: Troponins x 3 negative. Cardiology input appreciated-indicate that his chest discomfort may be related to tachycardia and not ischemia and do not recommend any further workup if echocardiogram is normal. Echo: Mild LVH and LVEF 55-60%. Grade 1 diastolic dysfunction. No pericardial effusion. Currently without chest pain. Negative  d-dimer.    2. Orthostatic hypotension: Likely secondary to dehydration. Seems to have resolved.  3. Profound weight loss/HIV +: Follow viral load. CD4  count: 30 . Will need outpatient ID consultation. TSH: 5.433. Check free T3 and free T4. 4. Hypertension: Controlled. Beta blocker resumed at half the dose because of low blood pressure 5. Mild hypokalemia: Repleted will need a repeat BMP in one week 6. 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 History of GERD: Continue PPIs. 8. History of bipolar disorder: Continue Seroquel 9. Psoriasis   Discharge Exam:  Blood pressure 93/60, pulse 75, temperature 97.7 F (36.5 C), temperature source Oral, resp. rate 18, height 5\' 9"  (1.753 m), weight 70.9 kg (156 lb 4.9 oz), SpO2 99.00%.  General exam: Comfortable.  Respiratory system: Clear. No increased work of breathing.  Cardiovascular system: S1 & S2 heard, RRR. No JVD, murmurs, gallops, clicks or pedal edema. Telemetry: Sinus rhythm.  Gastrointestinal system: Abdomen is nondistended, soft and nontender. Normal bowel sounds heard.  Central nervous system: Alert and oriented. No focal neurological deficits.  Extremities: Symmetric 5 x 5 power. Skin changes from psoriasis     Signed:  Cree Kunert  05/22/2013, 8:10 AM

## 2013-05-31 ENCOUNTER — Encounter (HOSPITAL_COMMUNITY): Payer: Self-pay | Admitting: Emergency Medicine

## 2013-05-31 ENCOUNTER — Emergency Department (HOSPITAL_COMMUNITY)
Admission: EM | Admit: 2013-05-31 | Discharge: 2013-05-31 | Disposition: A | Discharge status: Home / Self Care | Payer: Medicaid Other | Attending: Emergency Medicine | Admitting: Emergency Medicine

## 2013-05-31 DIAGNOSIS — F411 Generalized anxiety disorder: Secondary | ICD-10-CM | POA: Insufficient documentation

## 2013-05-31 DIAGNOSIS — R630 Anorexia: Secondary | ICD-10-CM | POA: Insufficient documentation

## 2013-05-31 DIAGNOSIS — M6281 Muscle weakness (generalized): Secondary | ICD-10-CM | POA: Insufficient documentation

## 2013-05-31 DIAGNOSIS — F319 Bipolar disorder, unspecified: Secondary | ICD-10-CM | POA: Insufficient documentation

## 2013-05-31 DIAGNOSIS — Z79899 Other long term (current) drug therapy: Secondary | ICD-10-CM | POA: Insufficient documentation

## 2013-05-31 DIAGNOSIS — R634 Abnormal weight loss: Secondary | ICD-10-CM | POA: Insufficient documentation

## 2013-05-31 DIAGNOSIS — B20 Human immunodeficiency virus [HIV] disease: Secondary | ICD-10-CM | POA: Insufficient documentation

## 2013-05-31 DIAGNOSIS — I10 Essential (primary) hypertension: Secondary | ICD-10-CM | POA: Insufficient documentation

## 2013-05-31 DIAGNOSIS — B370 Candidal stomatitis: Secondary | ICD-10-CM | POA: Insufficient documentation

## 2013-05-31 DIAGNOSIS — Z881 Allergy status to other antibiotic agents status: Secondary | ICD-10-CM | POA: Insufficient documentation

## 2013-05-31 DIAGNOSIS — Z882 Allergy status to sulfonamides status: Secondary | ICD-10-CM | POA: Insufficient documentation

## 2013-05-31 LAB — BASIC METABOLIC PANEL
BUN: 9 mg/dL (ref 6–23)
CO2: 23 mEq/L (ref 19–32)
Chloride: 103 mEq/L (ref 96–112)
Creatinine, Ser: 0.89 mg/dL (ref 0.50–1.35)
GFR calc Af Amer: >90 mL/min (ref >90)
Potassium: 3.8 mEq/L (ref 3.5–5.1)

## 2013-05-31 LAB — CBC WITH DIFFERENTIAL/PLATELET
MCH: 30.4 pg (ref 26.0–34.0)
MCHC: 33.8 g/dL (ref 30.0–36.0)
MCV: 89.8 fL (ref 78.0–100.0)
Monocytes Absolute: 0.3 10*3/uL (ref 0.1–1.0)
Monocytes Relative: 13 % — ABNORMAL HIGH (ref 3–12)
Platelets: 135 10*3/uL — ABNORMAL LOW (ref 150–400)
RDW: 14.6 % (ref 11.5–15.5)

## 2013-05-31 MED ORDER — SODIUM CHLORIDE 0.9 % IV BOLUS (SEPSIS)
1000.0000 mL | Freq: Once | INTRAVENOUS | Status: AC
  Administered 2013-05-31: 1000 mL via INTRAVENOUS

## 2013-05-31 MED ORDER — FLUCONAZOLE 200 MG PO TABS: 200.0000 mg | ORAL_TABLET | Freq: Every day | ORAL | Status: AC

## 2013-05-31 MED ORDER — LORAZEPAM 1 MG PO TABS
2.0000 mg | ORAL_TABLET | Freq: Once | ORAL | Status: AC
  Administered 2013-05-31: 2 mg via ORAL
  Filled 2013-05-31: qty 2

## 2013-05-31 MED ORDER — ALPRAZOLAM 0.5 MG PO TABS: 0.5000 mg | ORAL_TABLET | Freq: Every day | ORAL | Status: DC | PRN

## 2013-05-31 NOTE — ED Provider Notes (Signed)
CSN: [id number]     Arrival date & time 05/31/13  1653 History   First MD Initiated Contact with Patient 05/31/13 1654     Chief Complaint  Patient presents with  . 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 PT was discharged from the hospital, w/o any specific ID f/u according to him, and he continues to have fatgue - so he comes to [facility] today. Pt has no chest pain, no sore throat, no n/v/f/c/abd pain, uti like sx. No new rashes. He reports feeling anxious, and using ativan in the past for it - but not having any access to it currently.   The history is provided by the patient.    Past Medical History  Diagnosis Date  . Hypertension   . Bipolar disorder   . Depressed   . HIV (human immunodeficiency virus infection)    Past Surgical History  Procedure Laterality Date  . Finger surgery     Family History  Problem Relation Age of Onset  . Esophageal cancer Father    History  Substance Use Topics  . Smoking status: Never Smoker   . Smokeless tobacco: Not on file  . Alcohol Use: No    Review of Systems  Constitutional: Positive for activity change, appetite change, fatigue and unexpected weight change. Negative for fever and chills.  HENT: Negative for neck pain and neck stiffness.   Respiratory: Negative for cough and shortness of breath.   Cardiovascular: Negative for chest pain.  Gastrointestinal: Negative for abdominal pain.  Genitourinary: Negative for dysuria.  Skin: Negative for rash.  Neurological: Positive for weakness.    Allergies  Levaquin and Sulfa antibiotics  Home Medications   Current Outpatient Rx  Name  Route  Sig  Dispense  Refill  . dexlansoprazole (DEXILANT) 60 MG capsule   Oral   Take 60 mg by mouth at bedtime.          Marland Kitchen LORazepam (ATIVAN) 0.5 MG tablet   Oral   Take 0.5 mg  by mouth 3 (three) times daily as needed for anxiety.          . metoprolol succinate (TOPROL-XL) 25 MG 24 hr tablet   Oral   Take 1 tablet (25 mg total) by mouth every evening.   30 tablet   2   . QUEtiapine (SEROQUEL) 300 MG tablet   Oral   Take 300 mg by mouth at bedtime.          BP 101/65  Pulse 106  Temp(Src) 98.4 F (36.9 C)  Resp 18  SpO2 100% Physical Exam  Nursing note and vitals reviewed. Constitutional: He is oriented to person, place, and time. He appears well-developed.  HENT:  Head: Normocephalic and atraumatic.  Oral thrush  Eyes: Conjunctivae and EOM are normal. Pupils are equal, round, and reactive to light.  Neck: Normal range of motion. Neck supple. No JVD present.  Cardiovascular: Normal rate and regular rhythm.   Pulmonary/Chest: Effort normal and breath sounds normal.  Abdominal: Soft. Bowel sounds are normal. He exhibits no distension. There is no tenderness. There is no rebound and no guarding.  Neurological: He is alert and oriented to person, place, and time.  Skin: Skin is warm.    ED Course  Procedures (including critical care time) Labs Review Labs Reviewed  CBC WITH DIFFERENTIAL  BASIC METABOLIC PANEL  URINALYSIS,  ROUTINE W REFLEX MICROSCOPIC   Imaging Review No results found.  MDM  No diagnosis found. Pt comes in with cc of fatigue and anxiety. Pt was just diagnosed with HIV, has AIDS now per CD4. He has oral thrush on my exam, rest of the exam and history are benign. We will get basic labs, and hydrate. Anxiety  - we will give him ativan x 7 days.  Derwood Kaplan, MD 05/31/13 Rickey Primus

## 2013-05-31 NOTE — ED Notes (Signed)
Per EMS [facility] c/o weakness. Pt was recently discharged from [facility] with WBC count of 4. Pt has hx of Aids. VSS with EMS.

## 2013-06-03 ENCOUNTER — Ambulatory Visit (INDEPENDENT_AMBULATORY_CARE_PROVIDER_SITE_OTHER): Payer: Medicaid Other

## 2013-06-03 DIAGNOSIS — Z79899 Other long term (current) drug therapy: Secondary | ICD-10-CM

## 2013-06-03 DIAGNOSIS — Z113 Encounter for screening for infections with a predominantly sexual mode of transmission: Secondary | ICD-10-CM

## 2013-06-03 DIAGNOSIS — F1911 Other psychoactive substance abuse, in remission: Secondary | ICD-10-CM

## 2013-06-03 DIAGNOSIS — B20 Human immunodeficiency virus [HIV] disease: Secondary | ICD-10-CM

## 2013-06-03 LAB — CBC WITH DIFFERENTIAL/PLATELET
Basophils Absolute: 0 10*3/uL (ref 0.0–0.1)
Eosinophils Relative: 5 % (ref 0–5)
HCT: 31.9 % — ABNORMAL LOW (ref 39.0–52.0)
Hemoglobin: 10.4 g/dL — ABNORMAL LOW (ref 13.0–17.0)
Lymphocytes Relative: 13 % (ref 12–46)
MCV: 91.9 fL (ref 78.0–100.0)
Monocytes Absolute: 0.3 10*3/uL (ref 0.1–1.0)
Monocytes Relative: 9 % (ref 3–12)
Neutro Abs: 2.3 10*3/uL (ref 1.7–7.7)
RDW: 15.2 % (ref 11.5–15.5)
WBC: 3.1 10*3/uL — ABNORMAL LOW (ref 4.0–10.5)

## 2013-06-04 ENCOUNTER — Telehealth: Payer: Self-pay

## 2013-06-04 LAB — COMPLETE METABOLIC PANEL WITH GFR
AST: 25 U/L (ref 0–37)
Albumin: 3.3 g/dL — ABNORMAL LOW (ref 3.5–5.2)
Alkaline Phosphatase: 57 U/L (ref 39–117)
BUN: 13 mg/dL (ref 6–23)
Potassium: 4.1 mEq/L (ref 3.5–5.3)
Sodium: 138 mEq/L (ref 135–145)
Total Bilirubin: 0.3 mg/dL (ref 0.3–1.2)
Total Protein: 6.5 g/dL (ref 6.0–8.3)

## 2013-06-04 LAB — RPR

## 2013-06-04 LAB — HEPATITIS B SURFACE ANTIBODY,QUALITATIVE: Hep B S Ab: NEGATIVE

## 2013-06-04 LAB — LIPID PANEL: Triglycerides: 782 mg/dL — ABNORMAL HIGH (ref <150)

## 2013-06-04 LAB — HEPATITIS B CORE ANTIBODY, TOTAL: Hep B Core Total Ab: NEGATIVE

## 2013-06-04 LAB — HIV-1 RNA ULTRAQUANT REFLEX TO GENTYP+: HIV-1 RNA Quant, Log: 6.2 {Log} — ABNORMAL HIGH (ref <1.30)

## 2013-06-04 LAB — T-HELPER CELL (CD4) - (RCID CLINIC ONLY)
CD4 % Helper T Cell: 5 % — ABNORMAL LOW (ref 33–55)
CD4 T Cell Abs: 20 /uL — ABNORMAL LOW (ref 400–2700)

## 2013-06-04 LAB — HEPATITIS C ANTIBODY: HCV Ab: NEGATIVE

## 2013-06-04 NOTE — Telephone Encounter (Signed)
Referral made to [facility] for Case Manager Assignment.   Ryan [name]

## 2013-06-09 ENCOUNTER — Telehealth: Payer: Self-pay

## 2013-06-09 DIAGNOSIS — F1911 Other psychoactive substance abuse, in remission: Secondary | ICD-10-CM

## 2013-06-09 HISTORY — DX: Other psychoactive substance abuse, in remission: F19.11

## 2013-06-09 NOTE — Telephone Encounter (Signed)
.  .  .        xx

## 2013-06-09 NOTE — Telephone Encounter (Addendum)
Patient's sister(Cheryl)  is calling to say he has been having problem with sleeping at night and is hearing voices. He calls his Sister all times of the night saying the voices are very loud and want to talk all the time.  He says he is killing people in his head. He says he is having suicidal thoughts of hanging himself with his belt.  They are concerned about what you may do to someone due to his "crazy talk".  Patient told his Sister he did not think the Seroquel is working.   She says he " passed out" at least 3 times per patient report. This  Was not witnessed. Patient awaken on the floor after not knowing what had happen for hours of time.  They are not sure who prescribes to Mental Health medications.  He does have a primary care physician.   Sister advised immediate help is needed for Mental Health evaluation.  She stated there was no one to take him and she lives two hours away. I advised a 911 call would be acceptable in this situation and she declined.  She was only calling to make Dr Drue Second aware of this information prior to the appointment in case she is not able to speak with the physician.  I advised patient Dr Drue Second will be addressing his HIV status and may refer him to local hospital for Mental Health admission if needed.  In the meantime the family should assure the patient is safe and that others are not at risk of harm.   Tomasita Morrow

## 2013-06-09 NOTE — Progress Notes (Signed)
Previous hospital admission 05-20-13 Patient arrived with sister via wheel chair.  He is not able to walk and his Sister states he crawled to the door when she arraived.   He gives history of diarrhea previously for 3 days which has stopped but now he is nauseous .  Pt appears clammy and is not able to sit up in wheel chair due to weakness.  He thought he was seeing a doctor today so he did not go back to the Emergency Room.   He was there the previous Monday and states they did nothing.   He appears to be in need of hydration and I have asked him to present to the Emergency Room since we do not have a provider in the office today.  He has refused.  Currently he lives alone and will be evicted on Friday of this week.  He has no where to go since his Sister is not able to take him in and his Mother is 65 + years old.   This is a big concern for the family.  He has no income and has not been able to purchase food which may have contributed to his 80 lbs weight loss in 6 months .   Pt think in his present state he is not able to care for himself and perform ADL's.   I will contact [facility] for Case Management assistance. Hopefully they will be able to assist with some the psychosocial issues and possible housing.    I will schedule him for a STAT return visit with the physician next week.  Pt advised to go to ED for evaluation and he again refused.  He has no phone and his Sister Rolly Pancake is his point of contact. She can be reached @ [phone].   Laurell Josephs, RN

## 2013-06-10 ENCOUNTER — Ambulatory Visit (INDEPENDENT_AMBULATORY_CARE_PROVIDER_SITE_OTHER): Payer: Medicaid Other | Admitting: Internal Medicine

## 2013-06-10 ENCOUNTER — Encounter: Payer: Self-pay | Admitting: Internal Medicine

## 2013-06-10 ENCOUNTER — Encounter (HOSPITAL_COMMUNITY): Payer: Self-pay | Admitting: Emergency Medicine

## 2013-06-10 ENCOUNTER — Emergency Department (HOSPITAL_COMMUNITY)
Admission: EM | Admit: 2013-06-10 | Discharge: 2013-06-11 | Disposition: A | Discharge status: Home / Self Care | Payer: Medicaid Other | Attending: Emergency Medicine | Admitting: Emergency Medicine

## 2013-06-10 VITALS — BP 116/70 | HR 111 | Temp 99.7°F | Wt 151.0 lb

## 2013-06-10 DIAGNOSIS — Z79899 Other long term (current) drug therapy: Secondary | ICD-10-CM | POA: Insufficient documentation

## 2013-06-10 DIAGNOSIS — IMO0002 Reserved for concepts with insufficient information to code with codable children: Secondary | ICD-10-CM | POA: Insufficient documentation

## 2013-06-10 DIAGNOSIS — F329 Major depressive disorder, single episode, unspecified: Secondary | ICD-10-CM

## 2013-06-10 DIAGNOSIS — R5381 Other malaise: Secondary | ICD-10-CM | POA: Insufficient documentation

## 2013-06-10 DIAGNOSIS — F411 Generalized anxiety disorder: Secondary | ICD-10-CM

## 2013-06-10 DIAGNOSIS — Z21 Asymptomatic human immunodeficiency virus [HIV] infection status: Secondary | ICD-10-CM | POA: Insufficient documentation

## 2013-06-10 DIAGNOSIS — I10 Essential (primary) hypertension: Secondary | ICD-10-CM | POA: Insufficient documentation

## 2013-06-10 DIAGNOSIS — B20 Human immunodeficiency virus [HIV] disease: Secondary | ICD-10-CM

## 2013-06-10 DIAGNOSIS — F319 Bipolar disorder, unspecified: Secondary | ICD-10-CM | POA: Insufficient documentation

## 2013-06-10 DIAGNOSIS — F419 Anxiety disorder, unspecified: Secondary | ICD-10-CM | POA: Diagnosis present

## 2013-06-10 DIAGNOSIS — G479 Sleep disorder, unspecified: Secondary | ICD-10-CM | POA: Insufficient documentation

## 2013-06-10 DIAGNOSIS — R443 Hallucinations, unspecified: Secondary | ICD-10-CM | POA: Insufficient documentation

## 2013-06-10 DIAGNOSIS — F3289 Other specified depressive episodes: Secondary | ICD-10-CM

## 2013-06-10 LAB — URINALYSIS, ROUTINE W REFLEX MICROSCOPIC
Bilirubin Urine: NEGATIVE
Glucose, UA: NEGATIVE mg/dL
Hgb urine dipstick: NEGATIVE
Ketones, ur: NEGATIVE mg/dL
Leukocytes, UA: NEGATIVE
Nitrite: NEGATIVE
Protein, ur: NEGATIVE mg/dL
Specific Gravity, Urine: 1.024 (ref 1.005–1.030)
Urobilinogen, UA: 1 mg/dL (ref 0.0–1.0)
pH: 7 (ref 5.0–8.0)

## 2013-06-10 LAB — RAPID URINE DRUG SCREEN, HOSP PERFORMED
Amphetamines: NOT DETECTED
Barbiturates: NOT DETECTED
Benzodiazepines: POSITIVE — AB
Cocaine: NOT DETECTED
Opiates: NOT DETECTED
Tetrahydrocannabinol: NOT DETECTED

## 2013-06-10 LAB — COMPREHENSIVE METABOLIC PANEL WITH GFR
ALT: 47 U/L (ref 0–53)
Albumin: 2.9 g/dL — ABNORMAL LOW (ref 3.5–5.2)
Alkaline Phosphatase: 72 U/L (ref 39–117)
Calcium: 9.3 mg/dL (ref 8.4–10.5)
GFR calc Af Amer: >90 mL/min (ref >90)
Glucose, Bld: 104 mg/dL — ABNORMAL HIGH (ref 70–99)
Potassium: 3.4 meq/L — ABNORMAL LOW (ref 3.5–5.1)
Sodium: 137 meq/L (ref 135–145)
Total Protein: 6.9 g/dL (ref 6.0–8.3)

## 2013-06-10 LAB — CBC
HCT: 31.2 % — ABNORMAL LOW (ref 39.0–52.0)
Hemoglobin: 10.3 g/dL — ABNORMAL LOW (ref 13.0–17.0)
MCH: 29.9 pg (ref 26.0–34.0)
MCHC: 33 g/dL (ref 30.0–36.0)
MCV: 90.7 fL (ref 78.0–100.0)
Platelets: 160 K/uL (ref 150–400)
RBC: 3.44 MIL/uL — ABNORMAL LOW (ref 4.22–5.81)
RDW: 14.8 % (ref 11.5–15.5)
WBC: 3.2 10*3/uL — ABNORMAL LOW (ref 4.0–10.5)

## 2013-06-10 LAB — COMPREHENSIVE METABOLIC PANEL
AST: 64 U/L — ABNORMAL HIGH (ref 0–37)
BUN: 12 mg/dL (ref 6–23)
CO2: 27 mEq/L (ref 19–32)
Chloride: 103 mEq/L (ref 96–112)
Creatinine, Ser: 0.97 mg/dL (ref 0.50–1.35)
GFR calc non Af Amer: >90 mL/min (ref >90)
Total Bilirubin: 0.3 mg/dL (ref 0.3–1.2)

## 2013-06-10 LAB — ACETAMINOPHEN LEVEL: Acetaminophen (Tylenol), Serum: <15 ug/mL (ref 10–30)

## 2013-06-10 LAB — HIV-1 GENOTYPR PLUS

## 2013-06-10 LAB — SALICYLATE LEVEL: Salicylate Lvl: <2 mg/dL — ABNORMAL LOW (ref 2.8–20.0)

## 2013-06-10 LAB — ETHANOL: Alcohol, Ethyl (B): <11 mg/dL (ref 0–11)

## 2013-06-10 MED ORDER — ZOLPIDEM TARTRATE 5 MG PO TABS
5.0000 mg | ORAL_TABLET | Freq: Every evening | ORAL | Status: DC | PRN
  Administered 2013-06-10: 5 mg via ORAL
  Filled 2013-06-10: qty 1

## 2013-06-10 MED ORDER — IBUPROFEN 200 MG PO TABS
600.0000 mg | ORAL_TABLET | Freq: Three times a day (TID) | ORAL | Status: DC | PRN
  Administered 2013-06-10: 600 mg via ORAL
  Filled 2013-06-10: qty 3

## 2013-06-10 MED ORDER — ACETAMINOPHEN 325 MG PO TABS: 650.0000 mg | ORAL_TABLET | ORAL | Status: DC | PRN

## 2013-06-10 MED ORDER — RISPERIDONE 1 MG PO TBDP
1.0000 mg | ORAL_TABLET | Freq: Two times a day (BID) | ORAL | Status: DC
  Administered 2013-06-10 – 2013-06-11 (×2): 1 mg via ORAL
  Filled 2013-06-10 (×3): qty 1

## 2013-06-10 MED ORDER — AZITHROMYCIN 600 MG PO TABS: 1200.0000 mg | ORAL_TABLET | ORAL | Status: DC

## 2013-06-10 MED ORDER — NICOTINE 21 MG/24HR TD PT24: 21.0000 mg | MEDICATED_PATCH | Freq: Every day | TRANSDERMAL | Status: DC

## 2013-06-10 MED ORDER — FLUCONAZOLE 200 MG PO TABS: 200.0000 mg | ORAL_TABLET | ORAL | Status: DC

## 2013-06-10 MED ORDER — ONDANSETRON HCL 4 MG PO TABS: 4.0000 mg | ORAL_TABLET | Freq: Three times a day (TID) | ORAL | Status: DC | PRN

## 2013-06-10 MED ORDER — ALUM & MAG HYDROXIDE-SIMETH 200-200-20 MG/5ML PO SUSP: 30.0000 mL | ORAL | Status: DC | PRN

## 2013-06-10 MED ORDER — EMTRICITABINE-TENOFOVIR DF 200-300 MG PO TABS: 1.0000 | ORAL_TABLET | Freq: Every day | ORAL | Status: DC

## 2013-06-10 MED ORDER — DAPSONE 100 MG PO TABS: 100.0000 mg | ORAL_TABLET | Freq: Every day | ORAL | Status: DC

## 2013-06-10 MED ORDER — LORAZEPAM 1 MG PO TABS: 1.0000 mg | ORAL_TABLET | Freq: Three times a day (TID) | ORAL | Status: DC | PRN

## 2013-06-10 MED ORDER — ELVITEG-COBIC-EMTRICIT-TENOFDF 150-150-200-300 MG PO TABS: 1.0000 | ORAL_TABLET | Freq: Every day | ORAL | Status: DC

## 2013-06-10 NOTE — ED Notes (Signed)
Pt upset after finding out that he will stay here over night.  Pt states that he feels like he doesn't need to be here.  Pt denies SI/HI/AVH.  Pt feels that his sister "set him up."  Pt cooperative and redirectable.

## 2013-06-10 NOTE — ED Provider Notes (Signed)
Medical screening examination/treatment/procedure(s) were performed by non-physician practitioner and as supervising physician I was immediately available for consultation/collaboration.   Gavin Pound. Oletta Lamas, MD 06/10/13 2047

## 2013-06-10 NOTE — ED Notes (Signed)
Pt is coming over from md office due to he wants to to go to sleep due to he is having pain all over. Pt stopped taking his seraquil for the past 3 days, pt states that he is visual hallucations and threating to hurt self.

## 2013-06-10 NOTE — ED Notes (Signed)
Admitted voluntarily. Oriented to unit and provided food and drink.He denies being suicidal or homicidal. He states he told a [email or web address] he wanted to sleep but denies that statement being a suicide attempt or plan. He states he has an advanced and aggressive case of AIDS, and is currently not in treatment or on any medications. He states he has had this dx for 7 weeks.He has a history of hearing voices and taking Seroquel and doing well with it, but recently feels the Seroquel is dropping his blood pressure so low he is falling. As a result he is now taking it every other day at the suggestion of his mental health provider but it isnt working, he is having voices which he describes as his.He is also having poor sleep and takes Valium BID. But had run out for a few days until he saw the Dr yesterday. He is also being evicted from his apartment and will be moving in with his mom. He denies any previous attempts to hurt self or others. He doesn't feel he needs to be here, but is cooperative with process.

## 2013-06-10 NOTE — ED Notes (Signed)
Pts significant other has taken all belongings with her. Including clothes, wallet, shoes and ball cap

## 2013-06-10 NOTE — Progress Notes (Signed)
[facility] CLINIC NOTE  RFV: newly diagnosed, establishing care Subjective:    Patient ID: Ryan [name], male    DOB: 1970-11-10, 42 y.o.   MRN: [id number]  HPI Jatavius is a 42yo M with bipolar disease, newly diagnosed advanced HIV disease, CD 4 count of 20(5%)/VL 1.16M, genotype pending. He was recently hospitalized for ARF, malnutrition. He was discharged home and made se  He reports that he is having worsening auditory hallucinations plus not sleeping over 4-5 days. He states that his bipolar disease has been somewhat treated with seroquel 300mg  at bedtime but he notices that he is excessively sedated and frequently falling in his home in the morning due to low blood pressure? He denies los of consciousness but does describe an event that sounds like spell/convulsion. He has lost significant amount of weight in the last 6 months and wonders if seroquel dose is too much. He is seen by a family care doctor who manages his bipolar disease but is not under the care of any psychiatrist. he did take valium last night where he was able to sleep for 10 hrs. He denies any hallucinations this morning. Came to clinic with his sister who lives 2 hr away from him. His sister is quite concern for his health and safety since he disclosed yesterday to her that he wanted to hurt others and possibly himself. This morning he denies these intentions. She states that he abuses valium and buys it off the street rather than taking prescribed medications.  ROS: Loss of weight #60. +chills +rigors.  Soc= not working. Getting evicted next Monday. Previously worked as a Nutritional therapist. He has a 38 yr old son  Current Outpatient Prescriptions on File Prior to Visit  Medication Sig Dispense Refill  . ALPRAZolam (XANAX) 0.5 MG tablet Take 1 tablet (0.5 mg total) by mouth daily as needed for sleep or anxiety.  10 tablet  0  . dexlansoprazole (DEXILANT) 60 MG capsule Take 60 mg by mouth at bedtime.       Marland Kitchen LORazepam (ATIVAN) 0.5 MG  tablet Take 0.5 mg by mouth 3 (three) times daily as needed for anxiety.       . metoprolol succinate (TOPROL-XL) 25 MG 24 hr tablet Take 1 tablet (25 mg total) by mouth every evening.  30 tablet  2  . QUEtiapine (SEROQUEL) 300 MG tablet Take 300 mg by mouth at bedtime.       No current facility-administered medications on file prior to visit.   Active Ambulatory Problems    Diagnosis Date Noted  . Hypotension 01/03/2012  . ARF (acute renal failure) 01/03/2012  . Rash 01/03/2012  . HTN (hypertension) 01/03/2012  . Orthostatic hypotension 05/20/2013  . Chest pain on exertion 05/20/2013  . Weight loss 05/20/2013  . Bipolar disorder 05/20/2013  . GERD (gastroesophageal reflux disease) 05/20/2013  . Malnutrition of moderate degree 05/21/2013  . H/O: substance abuse 06/09/2013   Resolved Ambulatory Problems    Diagnosis Date Noted  . No Resolved Ambulatory Problems   Past Medical History  Diagnosis Date  . Hypertension   . Depressed   . HIV (human immunodeficiency virus infection)   - psoriasis - s/p thumb surgery  Soc hx: not on disability. Not employed. Soon to be evicted. Does have family in [place] but appears that they will not take him due to poorly controlled mental illness  Family hx: demenita  Review of Systems No diarrhea, no n/v. No fever or chills but feeling cold. Weight loss #  60 since March Intermittents. Nightsweats+ x 1-2 month.  12 point ROS is otherwise negative    Objective:   Physical Exam BP 116/70  Pulse 111  Temp(Src) 99.7 F (37.6 C) (Oral)  Wt 151 lb (68.493 kg)  BMI 21.07 kg/m2 Physical Exam  Constitutional:  oriented to person, place, and time. dishelved. No distress.  HENT: poor dentition Mouth/Throat: Oropharynx is clear and moist. No oropharyngeal exudate.  Cardiovascular: Normal rate, regular rhythm and normal heart sounds. Exam reveals no gallop and no friction rub.  No murmur heard.  Pulmonary/Chest: Effort normal and breath sounds  normal. No respiratory distress. He has no wheezes.  Abdominal: Soft. Bowel sounds are normal. He exhibits no distension. There is no tenderness.  Lymphadenopathy:  no cervical adenopathy.  Neurological: He is alert and oriented to person, place, and time.  Skin: Skin is warm and dry. silverly plaque on legs. Psychiatric: He has a normal mood and affect. His behavior is normal. Lethargic and does not want to be admitted for psych issues     Assessment & Plan:  Bipolar disease = poorly controlled ,c/b auditory hallucinations. That are as recent as yesterday with element of harm to others, noted yesterday. Recommend that she takes him to ED to be evaluated by psychiatry. Consider decreasing seroquel to 200mg  to see if it makes any difference in his orthostatics/hypotension. Ideally, he needs to be in an inpatient psych unit to have meds titrated.  HIV =  Will send in rx for starting ART, stribild daily dosing and anticipate to get approval in 2 wks  OI proph= He will need to start dapsone 100mg  daily for pcp proph and azithromycin 1200mg  qwk.   Failure to thrive/severe protein malnutrition =  Will recommend to get boost to supplement diet  Chills/nightsweats= will check quantiferon and AFB blood culture.  85 min spent with patient, with greater than 50% spent in counseling and coordination of care  rtc in 1 wk

## 2013-06-10 NOTE — ED Provider Notes (Signed)
CSN: [id number]     Arrival date & time 06/10/13  1332 History   This chart was scribed for non-physician practitioner working with Gavin Pound. Oletta Lamas, MD by Ashley Jacobs, ED scribe. This patient was seen in room WTR4/WLPT4 and the patient's care was started at  [facility] PM    Chief Complaint  Patient presents with  . Medical Clearance   (Consider location/radiation/quality/duration/timing/severity/associated sxs/prior Treatment) The history is provided by the patient, medical records and a relative. No language interpreter was used.   HPI Comments: Ryan [name] is a 42 y.o. male who was advised to go to the ED from  [facility] for medical clearnance after undergoing a medical evaluation for pertaining to a medical adjustment. Pt's relative reports that last night she was scared by his statements last night and that he has similar episodes when he is not taking his medications.  Pt mentions that he was seeking a medical adjusment because of hallicucination, HI, raginig violent thoughts, sleep disturbance. He reports that the symptoms reoccurred after not taking Seroquel because it makes him sick. He reports that he finds relief from his constant raging thoughts upon taking Seroquel. He states that the thoughts are constant and centered around harming others. He also reports having general diffuse pain and feels "worn out" by persistent thoughts.  Pt has a chronic hx of mental illnesses which includes bipolar disorder and depression. He also has a hx of HIV and AIDS and is currently under the care of an Infection Specialist.   Pt's PCP is Dr. Lysbeth Galas Past Medical History  Diagnosis Date  . Hypertension   . Bipolar disorder   . Depressed   . HIV (human immunodeficiency virus infection)   . H/O: substance abuse 06/09/2013    Crack cocaine,  marijuana   Past Surgical History  Procedure Laterality Date  . Finger surgery     Family History  Problem Relation Age of Onset  . Esophageal cancer Father     History  Substance Use Topics  . Smoking status: Never Smoker   . Smokeless tobacco: Not on file  . Alcohol Use: No    Review of Systems  Constitutional: Positive for activity change and fatigue.  Neurological: Positive for weakness.  Psychiatric/Behavioral: Positive for hallucinations, sleep disturbance and agitation.  All other systems reviewed and are negative.    Allergies  Levaquin and Sulfa antibiotics  Home Medications   Current Outpatient Rx  Name  Route  Sig  Dispense  Refill  . ALPRAZolam (XANAX) 0.5 MG tablet   Oral   Take 1 tablet (0.5 mg total) by mouth daily as needed for sleep or anxiety.   10 tablet   0   . azithromycin (ZITHROMAX) 600 MG tablet   Oral   Take 2 tablets (1,200 mg total) by mouth every 7 (seven) days.   8 tablet   5   . dapsone 100 MG tablet   Oral   Take 1 tablet (100 mg total) by mouth daily.   30 tablet   11   . dexlansoprazole (DEXILANT) 60 MG capsule   Oral   Take 60 mg by mouth at bedtime.          . elvitegravir-cobicistat-emtricitabine-tenofovir (STRIBILD) 150-150-200-300 MG TABS tablet   Oral   Take 1 tablet by mouth daily with breakfast.   30 tablet   11   . fluconazole (DIFLUCAN) 200 MG tablet   Oral   Take 1 tablet (200 mg total) by mouth every  7 (seven) days.   10 tablet   0   . LORazepam (ATIVAN) 0.5 MG tablet   Oral   Take 0.5 mg by mouth 3 (three) times daily as needed for anxiety.          . metoprolol succinate (TOPROL-XL) 25 MG 24 hr tablet   Oral   Take 1 tablet (25 mg total) by mouth every evening.   30 tablet   2   . QUEtiapine (SEROQUEL) 300 MG tablet   Oral   Take 300 mg by mouth at bedtime.          BP 115/66  Pulse 114  Temp(Src) 99.9 F (37.7 C) (Oral)  Resp 16  SpO2 99% Physical Exam  Nursing note and vitals reviewed. Constitutional: He is oriented to person, place, and time. He appears well-developed and well-nourished. No distress.  HENT:  Head: Normocephalic and  atraumatic.  Right Ear: External ear normal.  Left Ear: External ear normal.  Nose: Nose normal.  Eyes: Conjunctivae are normal.  Neck: Normal range of motion. No tracheal deviation present.  Cardiovascular: Normal rate, regular rhythm and normal heart sounds.   Pulmonary/Chest: Effort normal and breath sounds normal. No stridor.  Abdominal: Soft. He exhibits no distension. There is no tenderness.  Musculoskeletal: Normal range of motion.  Neurological: He is alert and oriented to person, place, and time.  Skin: Skin is warm and dry. He is not diaphoretic.  Psychiatric: He has a normal mood and affect. His behavior is normal.    ED Course  Procedures (including critical care time) DIAGNOSTIC STUDIES: Oxygen Saturation is 99% on room air, normal by my interpretation.    COORDINATION OF CARE: 2:38 PM Discussed course of care with pt . Pt understands and agrees.    Labs Review Labs Reviewed  CBC - Abnormal; Notable for the following:    WBC 3.2 (*)    RBC 3.44 (*)    Hemoglobin 10.3 (*)    HCT 31.2 (*)    All other components within normal limits  COMPREHENSIVE METABOLIC PANEL - Abnormal; Notable for the following:    Potassium 3.4 (*)    Glucose, Bld 104 (*)    Albumin 2.9 (*)    AST 64 (*)    All other components within normal limits  SALICYLATE LEVEL - Abnormal; Notable for the following:    Salicylate Lvl <2.0 (*)    All other components within normal limits  URINE RAPID DRUG SCREEN (HOSP PERFORMED) - Abnormal; Notable for the following:    Benzodiazepines POSITIVE (*)    All other components within normal limits  ACETAMINOPHEN LEVEL  ETHANOL  URINALYSIS, ROUTINE W REFLEX MICROSCOPIC   Imaging Review No results found.  MDM   1. Bipolar disorder   2. Anxiety    Patient presents from infectious disease due to very violent thoughts about chopping people up. He has been off seroquel for a few days due to interactions with his HIV medications. He is here today for  med adjustments and these very violent thoughts. He is medically cleared. Psych to see.   I personally performed the services described in this documentation, which was scribed in my presence. The recorded information has been reviewed and is accurate.       Mora Bellman, PA-C 06/10/13 2008

## 2013-06-10 NOTE — Consult Note (Signed)
[facility] Face-to-Face Psychiatry Consult   Reason for Consult:  Hallucinations Referring Physician:  ED MD FILIPE GREATHOUSE is an 42 y.o. male.  Assessment: AXIS I:  Anxiety Disorder NOS and Depressive Disorder NOS AXIS II:  Deferred AXIS III:   Past Medical History  Diagnosis Date  . Hypertension   . Bipolar disorder   . Depressed   . HIV (human immunodeficiency virus infection)   . H/O: substance abuse 06/09/2013    Crack cocaine,  marijuana   AXIS IV:  housing problems, other psychosocial or environmental problems, problems related to social environment and problems with primary support group AXIS V:  41-50 serious symptoms  Plan:  No evidence of imminent risk to self or others at present.    Subjective:   Ryan [name] is a 42 y.o. male does not warrant admission.  HPI:  Patient was at his infectious disease appointment and his sister reported he stopped taking his Seroquel.  The patient stated it was making him fall in the morning and felt it was too much of a dose since he has lost 80 pounds since February.  He denies hallucinations and suicidal ideations, no suicide attempts in the past.  Mr. Mudry feels his sister over reacted when he talked about hurting anyone that hurt his son but does not want or feel like hurting anyone.  He moved in with his mother 2 days ago because he could not afford his rent and gets along with her.  He has not been able to sleep until last night when he took a Valium.  Denies alcohol, drug, and tobacco use.  He reports bipolar disorder but denies depression or mania symptoms.  Low dose Risperdal BID will be given and the patient can be reassessed in the morning for discharge after medications side effects are evaluated.  Past Psychiatric History: Past Medical History  Diagnosis Date  . Hypertension   . Bipolar disorder   . Depressed   . HIV (human immunodeficiency virus infection)   . H/O: substance abuse 06/09/2013    Crack cocaine,  marijuana     reports that he has never smoked. He does not have any smokeless tobacco history on file. He reports that he uses illicit drugs (Cocaine, Marijuana, and Other-see comments). He reports that he does not drink alcohol. Family History  Problem Relation Age of Onset  . Esophageal cancer Father            Allergies:   Allergies  Allergen Reactions  . Levaquin [Levofloxacin In D5w] Other (See Comments)    Pain & nerve damage in fingers  . Sulfa Antibiotics Rash    ACT Assessment Complete:  No:   Past Psychiatric History: Diagnosis:  Bipolar  Hospitalizations:  None  Outpatient Care:  None  Substance Abuse Care:  None  Self-Mutilation:  None   Suicidal Attempts:  None   Homicidal Behaviors:  None   Violent Behaviors:  None   Place of Residence:  His mother Marital Status:  Single Employed/Unemployed:  Unemployed Family Supports:  Mother, sister Objective: Blood pressure 115/66, pulse 114, temperature 99.9 F (37.7 C), temperature source Oral, resp. rate 16, SpO2 99.00%.There is no weight on file to calculate BMI. Results for orders placed during the hospital encounter of 06/10/13 (from the past 72 hour(s))  ACETAMINOPHEN LEVEL     Status: None   Collection Time    06/10/13  2:45 PM      Result Value Range   Acetaminophen (Tylenol), Serum <  15.0  10 - 30 ug/mL   Comment:            THERAPEUTIC CONCENTRATIONS VARY     SIGNIFICANTLY. A RANGE OF 10-30     ug/mL MAY BE AN EFFECTIVE     CONCENTRATION FOR MANY PATIENTS.     HOWEVER, SOME ARE BEST TREATED     AT CONCENTRATIONS OUTSIDE THIS     RANGE.     ACETAMINOPHEN CONCENTRATIONS     >150 ug/mL AT 4 HOURS AFTER     INGESTION AND >50 ug/mL AT 12     HOURS AFTER INGESTION ARE     OFTEN ASSOCIATED WITH TOXIC     REACTIONS.  CBC     Status: Abnormal   Collection Time    06/10/13  2:45 PM      Result Value Range   WBC 3.2 (*) 4.0 - 10.5 K/uL   RBC 3.44 (*) 4.22 - 5.81 MIL/uL   Hemoglobin 10.3 (*) 13.0 - 17.0 g/dL   HCT 82.9  (*) 56.2 - 52.0 %   MCV 90.7  78.0 - 100.0 fL   MCH 29.9  26.0 - 34.0 pg   MCHC 33.0  30.0 - 36.0 g/dL   RDW 13.0  86.5 - 78.4 %   Platelets 160  150 - 400 K/uL  COMPREHENSIVE METABOLIC PANEL     Status: Abnormal   Collection Time    06/10/13  2:45 PM      Result Value Range   Sodium 137  135 - 145 mEq/L   Potassium 3.4 (*) 3.5 - 5.1 mEq/L   Chloride 103  96 - 112 mEq/L   CO2 27  19 - 32 mEq/L   Glucose, Bld 104 (*) 70 - 99 mg/dL   BUN 12  6 - 23 mg/dL   Creatinine, Ser 6.96  0.50 - 1.35 mg/dL   Calcium 9.3  8.4 - 29.5 mg/dL   Total Protein 6.9  6.0 - 8.3 g/dL   Albumin 2.9 (*) 3.5 - 5.2 g/dL   AST 64 (*) 0 - 37 U/L   ALT 47  0 - 53 U/L   Alkaline Phosphatase 72  39 - 117 U/L   Total Bilirubin 0.3  0.3 - 1.2 mg/dL   GFR calc non Af Amer >90  >90 mL/min   GFR calc Af Amer >90  >90 mL/min   Comment: (NOTE)     The eGFR has been calculated using the CKD EPI equation.     This calculation has not been validated in all clinical situations.     eGFR's persistently <90 mL/min signify possible Chronic Kidney     Disease.  ETHANOL     Status: None   Collection Time    06/10/13  2:45 PM      Result Value Range   Alcohol, Ethyl (B) <11  0 - 11 mg/dL   Comment:            LOWEST DETECTABLE LIMIT FOR     SERUM ALCOHOL IS 11 mg/dL     FOR MEDICAL PURPOSES ONLY  SALICYLATE LEVEL     Status: Abnormal   Collection Time    06/10/13  2:45 PM      Result Value Range   Salicylate Lvl <2.0 (*) 2.8 - 20.0 mg/dL  URINE RAPID DRUG SCREEN (HOSP PERFORMED)     Status: Abnormal   Collection Time    06/10/13  5:11 PM      Result Value Range  Opiates NONE DETECTED  NONE DETECTED   Cocaine NONE DETECTED  NONE DETECTED   Benzodiazepines POSITIVE (*) NONE DETECTED   Amphetamines NONE DETECTED  NONE DETECTED   Tetrahydrocannabinol NONE DETECTED  NONE DETECTED   Barbiturates NONE DETECTED  NONE DETECTED   Comment:            DRUG SCREEN FOR MEDICAL PURPOSES     ONLY.  IF CONFIRMATION IS NEEDED      FOR ANY PURPOSE, NOTIFY LAB     WITHIN 5 DAYS.                LOWEST DETECTABLE LIMITS     FOR URINE DRUG SCREEN     Drug Class       Cutoff (ng/mL)     Amphetamine      1000     Barbiturate      200     Benzodiazepine   200     Tricyclics       300     Opiates          300     Cocaine          300     THC              50  URINALYSIS, ROUTINE W REFLEX MICROSCOPIC     Status: None   Collection Time    06/10/13  5:11 PM      Result Value Range   Color, Urine YELLOW  YELLOW   APPearance CLEAR  CLEAR   Specific Gravity, Urine 1.024  1.005 - 1.030   pH 7.0  5.0 - 8.0   Glucose, UA NEGATIVE  NEGATIVE mg/dL   Hgb urine dipstick NEGATIVE  NEGATIVE   Bilirubin Urine NEGATIVE  NEGATIVE   Ketones, ur NEGATIVE  NEGATIVE mg/dL   Protein, ur NEGATIVE  NEGATIVE mg/dL   Urobilinogen, UA 1.0  0.0 - 1.0 mg/dL   Nitrite NEGATIVE  NEGATIVE   Leukocytes, UA NEGATIVE  NEGATIVE   Comment: MICROSCOPIC NOT DONE ON URINES WITH NEGATIVE PROTEIN, BLOOD, LEUKOCYTES, NITRITE, OR GLUCOSE <1000 mg/dL.   Labs are reviewed and are pertinent for HIV, patient in treatment.  Current Facility-Administered Medications  Medication Dose Route Frequency Provider Last Rate Last Dose  . acetaminophen (TYLENOL) tablet 650 mg  650 mg Oral Q4H PRN Mora Bellman, PA-C      . alum & mag hydroxide-simeth (MAALOX/MYLANTA) 200-200-20 MG/5ML suspension 30 mL  30 mL Oral PRN Mora Bellman, PA-C      . ibuprofen (ADVIL,MOTRIN) tablet 600 mg  600 mg Oral Q8H PRN Mora Bellman, PA-C   600 mg at 06/10/13 1712  . LORazepam (ATIVAN) tablet 1 mg  1 mg Oral Q8H PRN Mora Bellman, PA-C      . nicotine (NICODERM CQ - dosed in mg/24 hours) patch 21 mg  21 mg Transdermal Daily Hannah S Merrell, PA-C      . ondansetron [facility]) tablet 4 mg  4 mg Oral Q8H PRN Mora Bellman, PA-C      . zolpidem (AMBIEN) tablet 5 mg  5 mg Oral QHS PRN Mora Bellman, PA-C       Current Outpatient Prescriptions  Medication Sig Dispense  Refill  . dexlansoprazole (DEXILANT) 60 MG capsule Take 60 mg by mouth at bedtime.       . diazepam (VALIUM) 2 MG tablet Take 2 mg by mouth every 12 (twelve) hours as needed for anxiety.      Marland Kitchen  QUEtiapine (SEROQUEL) 300 MG tablet Take 300 mg by mouth at bedtime.      Marland Kitchen azithromycin (ZITHROMAX) 600 MG tablet Take 2 tablets (1,200 mg total) by mouth every 7 (seven) days.  8 tablet  5  . dapsone 100 MG tablet Take 1 tablet (100 mg total) by mouth daily.  30 tablet  11  . elvitegravir-cobicistat-emtricitabine-tenofovir (STRIBILD) 150-150-200-300 MG TABS tablet Take 1 tablet by mouth daily with breakfast.  30 tablet  11  . fluconazole (DIFLUCAN) 200 MG tablet Take 1 tablet (200 mg total) by mouth every 7 (seven) days.  10 tablet  0  . metoprolol succinate (TOPROL-XL) 25 MG 24 hr tablet Take 1 tablet (25 mg total) by mouth every evening.  30 tablet  2    Psychiatric Specialty Exam:  Completed in ED, reviewed, concur with findings    Blood pressure 115/66, pulse 114, temperature 99.9 F (37.7 C), temperature source Oral, resp. rate 16, SpO2 99.00%.There is no weight on file to calculate BMI.  General Appearance: Casual  Eye Contact::  Fair  Speech:  Normal Rate  Volume:  Normal  Mood:  Anxious  Affect:  Congruent  Thought Process:  Coherent  Orientation:  Full (Time, Place, and Person)  Thought Content:  WDL  Suicidal Thoughts:  No  Homicidal Thoughts:  No  Memory:  Immediate;   Fair Recent;   Fair Remote;   Fair  Judgement:  Fair  Insight:  Fair  Psychomotor Activity:  Normal  Concentration:  Fair  Recall:  Fair  Akathisia:  No  Handed:  Right  AIMS (if indicated):     Assets:  Resilience Social Support  Sleep:      Treatment Plan Summary: Daily contact with patient to assess and evaluate symptoms and progress in treatment Medication management and then re-evaluation in am on new medications.  Nanine Means, PMH-NP 06/10/2013 6:13 PM

## 2013-06-11 ENCOUNTER — Emergency Department (HOSPITAL_COMMUNITY)
Admission: EM | Admit: 2013-06-11 | Discharge: 2013-06-11 | Disposition: A | Discharge status: Home / Self Care | Payer: Medicaid Other | Attending: Emergency Medicine | Admitting: Emergency Medicine

## 2013-06-11 ENCOUNTER — Telehealth: Payer: Self-pay | Admitting: *Deleted

## 2013-06-11 ENCOUNTER — Encounter (HOSPITAL_COMMUNITY): Payer: Self-pay | Admitting: *Deleted

## 2013-06-11 DIAGNOSIS — F411 Generalized anxiety disorder: Secondary | ICD-10-CM | POA: Insufficient documentation

## 2013-06-11 DIAGNOSIS — F419 Anxiety disorder, unspecified: Secondary | ICD-10-CM

## 2013-06-11 DIAGNOSIS — Z792 Long term (current) use of antibiotics: Secondary | ICD-10-CM | POA: Insufficient documentation

## 2013-06-11 DIAGNOSIS — Z21 Asymptomatic human immunodeficiency virus [HIV] infection status: Secondary | ICD-10-CM | POA: Insufficient documentation

## 2013-06-11 DIAGNOSIS — Z79899 Other long term (current) drug therapy: Secondary | ICD-10-CM | POA: Insufficient documentation

## 2013-06-11 DIAGNOSIS — I10 Essential (primary) hypertension: Secondary | ICD-10-CM | POA: Insufficient documentation

## 2013-06-11 DIAGNOSIS — IMO0002 Reserved for concepts with insufficient information to code with codable children: Secondary | ICD-10-CM | POA: Insufficient documentation

## 2013-06-11 NOTE — ED Notes (Signed)
[facility]ought [facility]  Seen at [facility] and was d/c  Pt says he has no place to stay. Says he was upset when he was there .

## 2013-06-11 NOTE — Consult Note (Signed)
Note reviewed and agreed with  
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
## 2013-06-11 NOTE — ED Notes (Signed)
Pt left without receiving discharge papers.

## 2013-06-11 NOTE — Consult Note (Signed)
   Mr Pop says he is not and has not been suicidal or homicidal.  He is not hearing voices.  He is optimistic that his HIV can be treated and loves his 42 year old son.  I have everything to live for he says inspite of his AIDs diagnosis.  Admits he is angry and says "I think I have a lot to be angry about but I don't want ot hurt anybody."  Discharge home today.
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
## 2013-06-11 NOTE — ED Provider Notes (Signed)
CSN: [id number]     Arrival date & time 06/11/13  1557 History   First MD Initiated Contact with Patient 06/11/13 1653     Chief Complaint  Patient presents with  . V70.1   (Consider location/radiation/quality/duration/timing/severity/associated sxs/prior Treatment) Patient is a 42 y.o. male presenting with altered mental status. The history is provided by the patient (pt here for anxiety).  Altered Mental Status Presenting symptoms: lethargy   Severity:  Mild Most recent episode:  More than 2 days ago Episode history:  Multiple Timing:  Intermittent Progression:  Waxing and waning Associated symptoms: agitation   Associated symptoms: no abdominal pain, no hallucinations, no headaches, no rash and no seizures     Past Medical History  Diagnosis Date  . Hypertension   . Bipolar disorder   . Depressed   . HIV (human immunodeficiency virus infection)   . H/O: substance abuse 06/09/2013    Crack cocaine,  marijuana   Past Surgical History  Procedure Laterality Date  . Finger surgery     Family History  Problem Relation Age of Onset  . Esophageal cancer Father    History  Substance Use Topics  . Smoking status: Never Smoker   . Smokeless tobacco: Not on file  . Alcohol Use: No    Review of Systems  Constitutional: Negative for appetite change and fatigue.  HENT: Negative for congestion, sinus pressure and ear discharge.   Eyes: Negative for discharge.  Respiratory: Negative for cough.   Cardiovascular: Negative for chest pain.  Gastrointestinal: Negative for abdominal pain and diarrhea.  Genitourinary: Negative for frequency and hematuria.  Musculoskeletal: Negative for back pain.  Skin: Negative for rash.  Neurological: Negative for seizures and headaches.  Psychiatric/Behavioral: Positive for agitation. Negative for hallucinations.    Allergies  Levaquin and Sulfa antibiotics  Home Medications   Current Outpatient Rx  Name  Route  Sig  Dispense  Refill  .  dexlansoprazole (DEXILANT) 60 MG capsule   Oral   Take 60 mg by mouth at bedtime.          . diazepam (VALIUM) 2 MG tablet   Oral   Take 2 mg by mouth every 12 (twelve) hours as needed for anxiety.         . risperiDONE (RISPERDAL M-TABS) 1 MG disintegrating tablet   Oral   Take 1 mg by mouth at bedtime.         Marland Kitchen azithromycin (ZITHROMAX) 600 MG tablet   Oral   Take 2 tablets (1,200 mg total) by mouth every 7 (seven) days.   8 tablet   5   . dapsone 100 MG tablet   Oral   Take 1 tablet (100 mg total) by mouth daily.   30 tablet   11   . elvitegravir-cobicistat-emtricitabine-tenofovir (STRIBILD) 150-150-200-300 MG TABS tablet   Oral   Take 1 tablet by mouth daily with breakfast.   30 tablet   11   . fluconazole (DIFLUCAN) 200 MG tablet   Oral   Take 1 tablet (200 mg total) by mouth every 7 (seven) days.   10 tablet   0    BP 104/71  Pulse 127  Temp(Src) 99 F (37.2 C) (Oral)  Resp 17  Ht 5\' 9"  (1.753 m)  Wt 147 lb (66.679 kg)  BMI 21.7 kg/m2  SpO2 100% Physical Exam  Constitutional: He is oriented to person, place, and time. He appears well-developed.  HENT:  Head: Normocephalic.  Eyes: Conjunctivae and EOM are normal.  No scleral icterus.  Neck: Neck supple. No thyromegaly present.  Cardiovascular: Normal heart sounds.  Exam reveals no gallop and no friction rub.   No murmur heard. Pulmonary/Chest: No stridor. He has no wheezes. He has no rales. He exhibits no tenderness.  Abdominal: He exhibits no distension. There is no tenderness. There is no rebound.  Musculoskeletal: Normal range of motion. He exhibits no edema.  Lymphadenopathy:    He has no cervical adenopathy.  Neurological: He is oriented to person, place, and time. Coordination normal.  Skin: No rash noted. No erythema.  Psychiatric:  Pt mildly anxious    ED Course  Procedures (including critical care time) Labs Review Labs Reviewed - No data to display Imaging Review No results  found.  MDM  [facility] at [facility] and was discharged home.  Pt decided not to stay at [facility] and have more studies or treatment done 1. Anxiety        Benny Lennert, MD 06/11/13 [phone]

## 2013-06-11 NOTE — ED Provider Notes (Signed)
Psych MD has evaluated pt and feels he is stable for d/c. EDP is requested to d/c pt. Pt d/c with resources for outpt f/u.  Laray Anger, DO 06/11/13 1015

## 2013-06-14 ENCOUNTER — Telehealth: Payer: Self-pay | Admitting: *Deleted

## 2013-06-14 NOTE — Telephone Encounter (Signed)
Patient called, stating he was told he should follow up in 1 week at his last visit, confirmed in last office note.  Appointment made for 9/12 at 11. Pt verbalized agreement. Andree Coss, RN

## 2013-06-15 LAB — QUANTIFERON TB GOLD ASSAY (BLOOD): TB Ag value: 0.06 IU/mL

## 2013-06-16 LAB — HLA B*5701: HLA-B*5701: NEGATIVE

## 2013-06-17 ENCOUNTER — Telehealth: Payer: Self-pay | Admitting: *Deleted

## 2013-06-17 NOTE — Telephone Encounter (Signed)
Patient sister called to ask what the next step is in getting the patient his medication. Advised her the application went out 06/10/13 and it takes about 10-14 days to hear if it is approved. Once we get confirmation we will call the patient to see if they want it mailed or picked up locally. She wanted me to give her medication today and advised her we do not have medication on hand but could get her a Rx if she has about $2000 to pay for them. She advised she will wait for approval and advised her number is the best number to contact as the patient is not stable at this time.

## 2013-06-18 ENCOUNTER — Encounter: Payer: Self-pay | Admitting: Internal Medicine

## 2013-06-18 ENCOUNTER — Encounter: Payer: Self-pay | Admitting: *Deleted

## 2013-06-18 ENCOUNTER — Ambulatory Visit (INDEPENDENT_AMBULATORY_CARE_PROVIDER_SITE_OTHER): Payer: MEDICAID | Admitting: Internal Medicine

## 2013-06-18 VITALS — BP 97/62 | HR 103 | Temp 97.9°F | Ht 69.0 in | Wt 151.0 lb

## 2013-06-18 DIAGNOSIS — F319 Bipolar disorder, unspecified: Secondary | ICD-10-CM

## 2013-06-18 DIAGNOSIS — B20 Human immunodeficiency virus [HIV] disease: Secondary | ICD-10-CM

## 2013-06-18 MED ORDER — DAPSONE 100 MG PO TABS: 100.0000 mg | ORAL_TABLET | Freq: Every day | ORAL | Status: DC

## 2013-06-18 MED ORDER — RISPERIDONE 1 MG PO TBDP: 1.0000 mg | ORAL_TABLET | Freq: Two times a day (BID) | ORAL | Status: DC

## 2013-06-18 NOTE — Progress Notes (Signed)
[facility] NOTE  RFV: routine, 1 week follow up Subjective:    Patient ID: Ryan [name], male    DOB: 03/07/1971, 42 y.o.   MRN: [id number]  HPI 42yo M with HIV, CD 4 count 40, VL 77,000 also has significant mental health disorder and benzo abuse. First seen in clinic last week and reported to have significant auditory hallucinations and no sleep x 4-5 days. He went to ED where he was evaluated only overnight and changed to respiridone 1mg  QHS. The patient states that it does not help. He does not recall what he is taking. His adap has not processed yet for him to get access to HIV meds.he denies having hallucinations presently. He is staying at his mothers since being evicted.  Current Outpatient Prescriptions on File Prior to Visit  Medication Sig Dispense Refill  . dexlansoprazole (DEXILANT) 60 MG capsule Take 60 mg by mouth at bedtime.       . diazepam (VALIUM) 2 MG tablet Take 2 mg by mouth every 12 (twelve) hours as needed for anxiety.      Marland Kitchen azithromycin (ZITHROMAX) 600 MG tablet Take 2 tablets (1,200 mg total) by mouth every 7 (seven) days.  8 tablet  5  . dapsone 100 MG tablet Take 1 tablet (100 mg total) by mouth daily.  30 tablet  11  . elvitegravir-cobicistat-emtricitabine-tenofovir (STRIBILD) 150-150-200-300 MG TABS tablet Take 1 tablet by mouth daily with breakfast.  30 tablet  11  . fluconazole (DIFLUCAN) 200 MG tablet Take 1 tablet (200 mg total) by mouth every 7 (seven) days.  10 tablet  0  . risperiDONE (RISPERDAL M-TABS) 1 MG disintegrating tablet Take 1 mg by mouth at bedtime.       No current facility-administered medications on file prior to visit.   Active Ambulatory Problems    Diagnosis Date Noted  . Hypotension 01/03/2012  . ARF (acute renal failure) 01/03/2012  . Rash 01/03/2012  . HTN (hypertension) 01/03/2012  . Orthostatic hypotension 05/20/2013  . Chest pain on exertion 05/20/2013  . Weight loss 05/20/2013  . Bipolar disorder 05/20/2013  . GERD  (gastroesophageal reflux disease) 05/20/2013  . Malnutrition of moderate degree 05/21/2013  . H/O: substance abuse 06/09/2013  . Anxiety 06/10/2013  . Human immunodeficiency virus (HIV) disease 05/21/2013   Resolved Ambulatory Problems    Diagnosis Date Noted  . No Resolved Ambulatory Problems   Past Medical History  Diagnosis Date  . Hypertension   . Depressed   . HIV (human immunodeficiency virus infection)      Review of Systems Poor sleep in the last 2 nights, and nightsweats    Objective:   Physical Exam BP 97/62  Pulse 103  Temp(Src) 97.9 F (36.6 C) (Oral)  Ht 5\' 9"  (1.753 m)  Wt 151 lb (68.493 kg)  BMI 22.29 kg/m2 Physical Exam  Constitutional: He is oriented to person, place, and time. He appears well-developed and well-nourished. No distress.  HENT:  Mouth/Throat: Oropharynx is clear and moist. No oropharyngeal exudate.  Cardiovascular: Normal rate, regular rhythm and normal heart sounds. Exam reveals no gallop and no friction rub.  No murmur heard.  Pulmonary/Chest: Effort normal and breath sounds normal. No respiratory distress. He has no wheezes.  Abdominal: Soft. Bowel sounds are normal. He exhibits no distension. There is no tenderness.  Lymphadenopathy:  He has no cervical adenopathy.  Neurological: He is alert and oriented to person, place, and time.  Skin: Skin is warm and dry. No rash noted.  No erythema.  Psychiatric: He has a normal mood and affect. His behavior is normal.          Assessment & Plan:  hiv = will start taking stribild. Gave instructions how to take meds and importance of adherence. Provided with 1 bottle to start until adap is approved  oi prophylaxis = start taking dapsone, gave rx in hand and give azithromycin 1200mg  Q wk  Bipolar disease = no longer on seroquel. Will increase respiridone 1 mg BID  Call mom [phone] to verify directions  rtc in 1-2 wks to see how he is doing with adherence  Counseling = for mental  health, see how to establish care with [facility]  Case management needs are numerous, would like to establish with bridge counselor to help with accessing services including mental health services. Transportation and housing is going to be a great issue since I am not sure how long he will be able to stay with his mother who lives quite a distance from [employer].  Disability letter= will provide letters

## 2013-06-25 ENCOUNTER — Telehealth: Payer: Self-pay | Admitting: Internal Medicine

## 2013-06-25 ENCOUNTER — Inpatient Hospital Stay (HOSPITAL_COMMUNITY)
Admission: EM | Admit: 2013-06-25 | Discharge: 2013-06-29 | DRG: 975 | Disposition: A | Discharge status: Home / Self Care | Payer: Medicaid Other | Attending: Internal Medicine | Admitting: Internal Medicine

## 2013-06-25 ENCOUNTER — Encounter: Payer: Self-pay | Admitting: Internal Medicine

## 2013-06-25 ENCOUNTER — Emergency Department (HOSPITAL_COMMUNITY): Payer: Medicaid Other

## 2013-06-25 ENCOUNTER — Ambulatory Visit (INDEPENDENT_AMBULATORY_CARE_PROVIDER_SITE_OTHER): Payer: Medicaid Other | Admitting: Internal Medicine

## 2013-06-25 ENCOUNTER — Encounter (HOSPITAL_COMMUNITY): Payer: Self-pay

## 2013-06-25 ENCOUNTER — Inpatient Hospital Stay: Admission: AD | Admit: 2013-06-25 | Payer: MEDICAID | Source: Ambulatory Visit | Admitting: Internal Medicine

## 2013-06-25 VITALS — BP 115/70 | HR 136 | Temp 99.1°F

## 2013-06-25 DIAGNOSIS — R6251 Failure to thrive (child): Secondary | ICD-10-CM

## 2013-06-25 DIAGNOSIS — J189 Pneumonia, unspecified organism: Secondary | ICD-10-CM | POA: Diagnosis present

## 2013-06-25 DIAGNOSIS — R7989 Other specified abnormal findings of blood chemistry: Secondary | ICD-10-CM | POA: Diagnosis present

## 2013-06-25 DIAGNOSIS — R627 Adult failure to thrive: Secondary | ICD-10-CM

## 2013-06-25 DIAGNOSIS — F419 Anxiety disorder, unspecified: Secondary | ICD-10-CM

## 2013-06-25 DIAGNOSIS — R079 Chest pain, unspecified: Secondary | ICD-10-CM

## 2013-06-25 DIAGNOSIS — F319 Bipolar disorder, unspecified: Secondary | ICD-10-CM | POA: Diagnosis present

## 2013-06-25 DIAGNOSIS — IMO0002 Reserved for concepts with insufficient information to code with codable children: Secondary | ICD-10-CM

## 2013-06-25 DIAGNOSIS — K219 Gastro-esophageal reflux disease without esophagitis: Secondary | ICD-10-CM | POA: Diagnosis present

## 2013-06-25 DIAGNOSIS — Z5987 Material hardship due to limited financial resources, not elsewhere classified: Secondary | ICD-10-CM

## 2013-06-25 DIAGNOSIS — Z882 Allergy status to sulfonamides status: Secondary | ICD-10-CM

## 2013-06-25 DIAGNOSIS — R443 Hallucinations, unspecified: Secondary | ICD-10-CM

## 2013-06-25 DIAGNOSIS — F1410 Cocaine abuse, uncomplicated: Secondary | ICD-10-CM | POA: Diagnosis present

## 2013-06-25 DIAGNOSIS — I951 Orthostatic hypotension: Secondary | ICD-10-CM

## 2013-06-25 DIAGNOSIS — B59 Pneumocystosis: Secondary | ICD-10-CM | POA: Diagnosis present

## 2013-06-25 DIAGNOSIS — D649 Anemia, unspecified: Secondary | ICD-10-CM | POA: Diagnosis present

## 2013-06-25 DIAGNOSIS — Z560 Unemployment, unspecified: Secondary | ICD-10-CM

## 2013-06-25 DIAGNOSIS — F1210 Cannabis abuse, uncomplicated: Secondary | ICD-10-CM | POA: Diagnosis present

## 2013-06-25 DIAGNOSIS — I10 Essential (primary) hypertension: Secondary | ICD-10-CM | POA: Diagnosis present

## 2013-06-25 DIAGNOSIS — R509 Fever, unspecified: Secondary | ICD-10-CM

## 2013-06-25 DIAGNOSIS — Z23 Encounter for immunization: Secondary | ICD-10-CM

## 2013-06-25 DIAGNOSIS — R21 Rash and other nonspecific skin eruption: Secondary | ICD-10-CM

## 2013-06-25 DIAGNOSIS — Z598 Other problems related to housing and economic circumstances: Secondary | ICD-10-CM

## 2013-06-25 DIAGNOSIS — E440 Moderate protein-calorie malnutrition: Secondary | ICD-10-CM

## 2013-06-25 DIAGNOSIS — Z5900 Homelessness unspecified: Secondary | ICD-10-CM

## 2013-06-25 DIAGNOSIS — F1911 Other psychoactive substance abuse, in remission: Secondary | ICD-10-CM

## 2013-06-25 DIAGNOSIS — E860 Dehydration: Secondary | ICD-10-CM

## 2013-06-25 DIAGNOSIS — R000 Tachycardia, unspecified: Secondary | ICD-10-CM

## 2013-06-25 DIAGNOSIS — R946 Abnormal results of thyroid function studies: Secondary | ICD-10-CM | POA: Diagnosis present

## 2013-06-25 DIAGNOSIS — B20 Human immunodeficiency virus [HIV] disease: Principal | ICD-10-CM | POA: Diagnosis present

## 2013-06-25 DIAGNOSIS — Z79899 Other long term (current) drug therapy: Secondary | ICD-10-CM

## 2013-06-25 DIAGNOSIS — N179 Acute kidney failure, unspecified: Secondary | ICD-10-CM

## 2013-06-25 DIAGNOSIS — R634 Abnormal weight loss: Secondary | ICD-10-CM

## 2013-06-25 DIAGNOSIS — R531 Weakness: Secondary | ICD-10-CM | POA: Diagnosis present

## 2013-06-25 LAB — CBC WITH DIFFERENTIAL/PLATELET
Eosinophils Absolute: 0.1 10*3/uL (ref 0.0–0.7)
Eosinophils Relative: 1 % (ref 0–5)
HCT: 27.6 % — ABNORMAL LOW (ref 39.0–52.0)
Hemoglobin: 9.3 g/dL — ABNORMAL LOW (ref 13.0–17.0)
Lymphs Abs: 0.9 10*3/uL (ref 0.7–4.0)
MCH: 30.1 pg (ref 26.0–34.0)
MCV: 89.3 fL (ref 78.0–100.0)
Monocytes Relative: 10 % (ref 3–12)
RBC: 3.09 MIL/uL — ABNORMAL LOW (ref 4.22–5.81)

## 2013-06-25 LAB — COMPREHENSIVE METABOLIC PANEL
Alkaline Phosphatase: 70 U/L (ref 39–117)
BUN: 11 mg/dL (ref 6–23)
Calcium: 8.7 mg/dL (ref 8.4–10.5)
GFR calc Af Amer: >90 mL/min (ref >90)
GFR calc non Af Amer: >90 mL/min (ref >90)
Glucose, Bld: 90 mg/dL (ref 70–99)
Total Protein: 6.7 g/dL (ref 6.0–8.3)

## 2013-06-25 LAB — URINALYSIS, ROUTINE W REFLEX MICROSCOPIC
Ketones, ur: NEGATIVE mg/dL
Leukocytes, UA: NEGATIVE
Nitrite: NEGATIVE
Specific Gravity, Urine: 1.02 (ref 1.005–1.030)
Urobilinogen, UA: 0.2 mg/dL (ref 0.0–1.0)
pH: 7 (ref 5.0–8.0)

## 2013-06-25 LAB — POCT I-STAT 3, ART BLOOD GAS (G3+)
Patient temperature: 102
TCO2: 23 mmol/L (ref 0–100)
pH, Arterial: 7.432 (ref 7.350–7.450)

## 2013-06-25 LAB — LACTIC ACID, PLASMA: Lactic Acid, Venous: 1.3 mmol/L (ref 0.5–2.2)

## 2013-06-25 MED ORDER — DEXTROSE 5 % IV SOLN
500.0000 mg | INTRAVENOUS | Status: DC
  Administered 2013-06-25 – 2013-06-26 (×2): 500 mg via INTRAVENOUS
  Filled 2013-06-25 (×3): qty 500

## 2013-06-25 MED ORDER — ELVITEG-COBIC-EMTRICIT-TENOFDF 150-150-200-300 MG PO TABS
1.0000 | ORAL_TABLET | Freq: Every day | ORAL | Status: DC
  Administered 2013-06-26 – 2013-06-29 (×4): 1 via ORAL
  Filled 2013-06-25 (×6): qty 1

## 2013-06-25 MED ORDER — PREDNISONE 50 MG PO TABS: 50.0000 mg | ORAL_TABLET | Freq: Once | ORAL | Status: DC

## 2013-06-25 MED ORDER — RISPERIDONE 1 MG PO TBDP
1.0000 mg | ORAL_TABLET | Freq: Two times a day (BID) | ORAL | Status: DC
  Administered 2013-06-25 – 2013-06-27 (×4): 1 mg via ORAL
  Filled 2013-06-25 (×7): qty 1

## 2013-06-25 MED ORDER — DIAZEPAM 2 MG PO TABS
2.0000 mg | ORAL_TABLET | Freq: Two times a day (BID) | ORAL | Status: DC | PRN
  Administered 2013-06-27: 2 mg via ORAL
  Filled 2013-06-25: qty 1

## 2013-06-25 MED ORDER — ACETAMINOPHEN 650 MG RE SUPP: 650.0000 mg | Freq: Four times a day (QID) | RECTAL | Status: DC | PRN

## 2013-06-25 MED ORDER — ATOVAQUONE 750 MG/5ML PO SUSP
750.0000 mg | Freq: Two times a day (BID) | ORAL | Status: DC
  Administered 2013-06-26 – 2013-06-29 (×7): 750 mg via ORAL
  Filled 2013-06-25 (×10): qty 5

## 2013-06-25 MED ORDER — SODIUM CHLORIDE 0.9 % IV BOLUS (SEPSIS)
1000.0000 mL | Freq: Once | INTRAVENOUS | Status: AC
  Administered 2013-06-25: 1000 mL via INTRAVENOUS

## 2013-06-25 MED ORDER — POLYETHYLENE GLYCOL 3350 17 G PO PACK
17.0000 g | PACK | Freq: Every day | ORAL | Status: DC | PRN
  Administered 2013-06-26 – 2013-06-27 (×2): 17 g via ORAL
  Filled 2013-06-25 (×2): qty 1

## 2013-06-25 MED ORDER — SODIUM CHLORIDE 0.9 % IV SOLN
INTRAVENOUS | Status: AC
  Administered 2013-06-25 – 2013-06-26 (×2): via INTRAVENOUS

## 2013-06-25 MED ORDER — DEXTROSE 5 % IV SOLN
1.0000 g | INTRAVENOUS | Status: DC
  Administered 2013-06-25 – 2013-06-28 (×4): 1 g via INTRAVENOUS
  Filled 2013-06-25 (×5): qty 10

## 2013-06-25 MED ORDER — HEPARIN SODIUM (PORCINE) 5000 UNIT/ML IJ SOLN
5000.0000 [IU] | Freq: Three times a day (TID) | INTRAMUSCULAR | Status: DC
  Administered 2013-06-25 – 2013-06-29 (×11): 5000 [IU] via SUBCUTANEOUS
  Filled 2013-06-25 (×14): qty 1

## 2013-06-25 MED ORDER — PREDNISONE 50 MG PO TABS
50.0000 mg | ORAL_TABLET | Freq: Two times a day (BID) | ORAL | Status: DC
  Administered 2013-06-25 – 2013-06-29 (×8): 50 mg via ORAL
  Filled 2013-06-25 (×11): qty 1

## 2013-06-25 MED ORDER — ONDANSETRON HCL 4 MG/2ML IJ SOLN: 4.0000 mg | Freq: Four times a day (QID) | INTRAMUSCULAR | Status: DC | PRN

## 2013-06-25 MED ORDER — ONDANSETRON HCL 4 MG PO TABS
4.0000 mg | ORAL_TABLET | Freq: Four times a day (QID) | ORAL | Status: DC | PRN
  Administered 2013-06-27 – 2013-06-28 (×3): 4 mg via ORAL
  Filled 2013-06-25 (×3): qty 1

## 2013-06-25 MED ORDER — ATOVAQUONE 750 MG/5ML PO SUSP
750.0000 mg | Freq: Once | ORAL | Status: DC
  Filled 2013-06-25: qty 5

## 2013-06-25 MED ORDER — ACETAMINOPHEN 325 MG PO TABS
650.0000 mg | ORAL_TABLET | Freq: Four times a day (QID) | ORAL | Status: DC | PRN
  Administered 2013-06-26 – 2013-06-28 (×4): 650 mg via ORAL
  Filled 2013-06-25 (×4): qty 2

## 2013-06-25 NOTE — ED Notes (Signed)
Pt. [facility]  [facility] for failure to thrive.  Pt. Is tachycardic, 130s'  Pt. Denies any chest pain or sob.  Pt. Is having visual hallucinations.    BP 112/74 CBG 112, Alert and oriented X4.

## 2013-06-25 NOTE — Telephone Encounter (Signed)
Error

## 2013-06-25 NOTE — H&P (Addendum)
[facility] History and Physical  Ryan [name] ZOX:[id number] DOB: 01-11-1971 DOA: 06/25/2013  Referring physician: Dr. Drue Second PCP: Josue Hector, MD  Specialists: none  Chief Complaint: Fever  HPI: Ryan [name] is a 42 y.o. male  Past medical history of HIV with last CD4 count 20 and a viral count of 1.8 and when recently diagnosed and started on Stribil, also past medical history of bipolar disorder with hallucinations started on risperidone 06/24/2013, he currently moved out of his place about a week ago and into his mother as he was having some problem fungal ( which he can not elaborate) with his a.c. at his infectious disease appointment the day of admission he related having intermittent fevers from 100 100.1 yesterday with chills decreased appetite nausea vomiting constipation he has been taking all of his medication except for his dapsone. He relates he has been feeling progressively weaker for the past week but no cough, shortness of breath, no cuts or sores in her mouth pain. Some SOB on exerion.  Review of Systems: The patient denies anorexia, vision loss, decreased hearing, hoarseness, chest pain, syncope, peripheral edema, balance deficits, hemoptysis, abdominal pain, melena, hematochezia, severe indigestion/heartburn, hematuria, incontinence, genital sores, muscle weakness, suspicious skin lesions, transient blindness, difficulty walking, depression, unusual weight change, abnormal bleeding, enlarged lymph nodes, angioedema, and breast masses.    Past Medical History  Diagnosis Date  . Hypertension   . Bipolar disorder   . Depressed   . HIV (human immunodeficiency virus infection)   . H/O: substance abuse 06/09/2013    Crack cocaine,  marijuana   Past Surgical History  Procedure Laterality Date  . Finger surgery     Social History:  reports that he has never smoked. He does not have any smokeless tobacco history on file. He reports that he uses illicit  drugs (Cocaine, Marijuana, and Other-see comments). He reports that he does not drink alcohol.  lives at home with mother   Allergies  Allergen Reactions  . Levaquin [Levofloxacin In D5w] Other (See Comments)    Pain & nerve damage in fingers  . Sulfa Antibiotics Rash    Family History  Problem Relation Age of Onset  . Esophageal cancer Father     Prior to Admission medications   Medication Sig Start Date End Date Taking? Authorizing Provider  azithromycin (ZITHROMAX) 600 MG tablet Take 2 tablets (1,200 mg total) by mouth every 7 (seven) days. 06/10/13  Yes Judyann Munson, MD  dexlansoprazole (DEXILANT) 60 MG capsule Take 60 mg by mouth at bedtime.    Yes Historical Provider, MD  diazepam (VALIUM) 2 MG tablet Take 2 mg by mouth every 12 (twelve) hours as needed for anxiety.   Yes Historical Provider, MD  elvitegravir-cobicistat-emtricitabine-tenofovir (STRIBILD) 150-150-200-300 MG TABS tablet Take 1 tablet by mouth daily with breakfast. 06/10/13  Yes Judyann Munson, MD  risperiDONE (RISPERDAL M-TABS) 1 MG disintegrating tablet Take 1 tablet (1 mg total) by mouth 2 (two) times daily. 06/18/13  Yes Judyann Munson, MD   Physical Exam: Filed Vitals:   06/25/13 1400  BP: 98/63  Pulse: 125  Temp:   Resp: 23   BP 98/63  Pulse 125  Temp(Src) 102 F (38.9 C) (Oral)  Resp 23  SpO2 95%  General Appearance:    Alert, cooperative, no distress, appears stated age  Head:    Normocephalic, without obvious abnormality, atraumatic           Throat:   Lips, mucosa, and tongue dry with poor  oral hygiene   Neck:   Supple, no adenopathy;       thyroid:  No JVD     Lungs:     Clear to auscultation bilaterally, respirations unlabored     Heart:    Regular rate and rhythm, S1 and S2 normal, no murmur, rub   or gallop  Abdomen:     Soft, non-tender, bowel sounds active all four quadrants,    no masses, no organomegaly        Extremities:   Extremities normal, atraumatic, no cyanosis or edema      Skin:   Skin color, texture, turgor normal, no rashes or lesions  Lymph nodes:   Cervical, supraclavicular, and axillary nodes normal  Neurologic:   CNII-XII intact. Normal strength, sensation and reflexes      throughout     Labs on Admission:  Basic Metabolic Panel: No results found for this basename: NA, K, CL, CO2, GLUCOSE, BUN, CREATININE, CALCIUM, MG, PHOS,  in the last 168 hours Liver Function Tests: No results found for this basename: AST, ALT, ALKPHOS, BILITOT, PROT, ALBUMIN,  in the last 168 hours No results found for this basename: LIPASE, AMYLASE,  in the last 168 hours No results found for this basename: AMMONIA,  in the last 168 hours CBC: No results found for this basename: WBC, NEUTROABS, HGB, HCT, MCV, PLT,  in the last 168 hours Cardiac Enzymes: No results found for this basename: CKTOTAL, CKMB, CKMBINDEX, TROPONINI,  in the last 168 hours  BNP (last 3 results) No results found for this basename: PROBNP,  in the last 8760 hours CBG: No results found for this basename: GLUCAP,  in the last 168 hours  Radiological Exams on Admission: Dg Chest 2 View  06/25/2013   *RADIOLOGY REPORT*  Clinical Data: Tachycardia, history of HIV, hypertension, substance abuse, initial encounter.  CHEST - 2 VIEW  Comparison: 05/20/2013; 01/04/2012  Findings:  Grossly unchanged cardiac silhouette and mediastinal contours. There are ill-defined heterogeneous interstitial predominant opacities within the bilateral lungs.  These opacities appear to demonstrate a perihilar and slight upper lobe predominance.  No pleural effusion or pneumothorax.  No acute osseous abnormality.  IMPRESSION:  Bilateral perihilar and upper lung predominant interstitial opacities are nonspecific though given history of immunodeficiency are worrisome for multifocal atypical infection.  A follow-up chest radiograph in 4 to 6 weeks after treatment is recommended to ensure resolution.   Original Report Authenticated By:  Tacey Ruiz, MD    EKG: Independently reviewed.   Assessment/Plan Fever and chills - Chest x-ray showed Bilateral perihilar and upper lung predominant interstitial, concerning for PCP, we'll go ahead and start him on. Treatment atovaquone: 750 mg twice a day , we'll check a DFA, ABG, start him on empiric steroids. EEG shows a PO2 of less than 70 was continue prednisone 40 mg twice a day for 5 days followed by 40 mg daily for 5 days followed by 20 mg daily for 11 days.  - We'll get blood cultures x2, check a lactic acid. We'll get induced sputum - Will start on empiric treatment to for CAP. HOLD Azithromycin Q7 days as he will be on daily azithro.   Bipolar disorder: - Continue current psych medications. - consult psyq.  Human immunodeficiency virus (HIV) disease - We'll continue his current regimen of HARRT therapy.   Code Status: full Family Communication: none Disposition Plan: inpatient  Time spent: 80 minutes  Marinda Elk [facility] Pager [phone]  If 7PM-7AM, please  contact night-coverage [email or web address] Password [email or web address] 06/25/2013, 2:50 PM

## 2013-06-25 NOTE — ED Notes (Signed)
PT states that he is unsure of when he last took medications

## 2013-06-25 NOTE — ED Provider Notes (Signed)
CSN: [id number]     Arrival date & time 06/25/13  1337 History   First MD Initiated Contact with Patient 06/25/13 1339     Chief Complaint  Patient presents with  . Tachycardia   (Consider location/radiation/quality/duration/timing/severity/associated sxs/prior Treatment) HPI Patient with history of HIV who presents from ID clinic with FTT and tachycardia.  Patient denies any physical symptoms at this time.  He endorses visual hallucinations without SI or HI.  Patient provides only a limited history and states "I have AIDs and don't feel good."  Denies CP, SOB, HA.  Patient reported tactile fevers at home to ID attending.  Plan was for direct admit from ID clinic but no beds were available. Past Medical History  Diagnosis Date  . Hypertension   . Bipolar disorder   . Depressed   . HIV (human immunodeficiency virus infection)   . H/O: substance abuse 06/09/2013    Crack cocaine,  marijuana   Past Surgical History  Procedure Laterality Date  . Finger surgery     Family History  Problem Relation Age of Onset  . Esophageal cancer Father    History  Substance Use Topics  . Smoking status: Never Smoker   . Smokeless tobacco: Not on file  . Alcohol Use: No    Review of Systems  Constitutional: Negative.  Negative for fever and appetite change.  HENT: Negative for neck pain.   Respiratory: Negative.  Negative for chest tightness and shortness of breath.   Cardiovascular: Negative.  Negative for chest pain.  Gastrointestinal: Negative.  Negative for abdominal pain.  Genitourinary: Negative.  Negative for dysuria.  Musculoskeletal: Negative for back pain.  Skin: Negative for rash.  Neurological: Negative for headaches.  Psychiatric/Behavioral: Positive for hallucinations.  All other systems reviewed and are negative.    Allergies  Levaquin and Sulfa antibiotics  Home Medications  No current outpatient prescriptions on file. BP 98/63  Pulse 125  Temp(Src) 102 F (38.9 C)  (Oral)  Resp 23  SpO2 95% Physical Exam  Nursing note and vitals reviewed. Constitutional: He is oriented to person, place, and time.  Thin, chronically ill appearing  HENT:  Head: Normocephalic and atraumatic.  MM dry, no thrush noted  Eyes: Pupils are equal, round, and reactive to light.  Neck: Neck supple.  Cardiovascular: Regular rhythm and normal heart sounds.   No murmur heard. Pulmonary/Chest: Effort normal and breath sounds normal. No respiratory distress. He has no wheezes.  Abdominal: Soft. Bowel sounds are normal. There is no tenderness. There is no rebound.  Musculoskeletal: He exhibits no edema.  Lymphadenopathy:    He has cervical adenopathy.  Neurological: He is alert and oriented to person, place, and time.  Moves all 4 extremities  Skin: Skin is warm and dry. No rash noted.  Psychiatric: He has a normal mood and affect.    ED Course  Procedures (including critical care time) Labs Review Labs Reviewed  CBC WITH DIFFERENTIAL - Abnormal; Notable for the following:    RBC 3.09 (*)    Hemoglobin 9.3 (*)    HCT 27.6 (*)    Lymphocytes Relative 11 (*)    All other components within normal limits  COMPREHENSIVE METABOLIC PANEL - Abnormal; Notable for the following:    Sodium 134 (*)    Albumin 2.6 (*)    All other components within normal limits  SEDIMENTATION RATE - Abnormal; Notable for the following:    Sed Rate 77 (*)    All other components within normal  limits  POCT I-STAT 3, BLOOD GAS (G3+) - Abnormal; Notable for the following:    pCO2 arterial 33.3 (*)    pO2, Arterial 74.0 (*)    All other components within normal limits  URINE CULTURE  CULTURE, BLOOD (ROUTINE X 2)  CULTURE, BLOOD (ROUTINE X 2)  PNEUMOCYSTIS JIROVECI SMEAR BY DFA  CULTURE, EXPECTORATED SPUTUM-ASSESSMENT  URINALYSIS, ROUTINE W REFLEX MICROSCOPIC  LACTIC ACID, PLASMA  COMPREHENSIVE METABOLIC PANEL  CBC   Imaging Review Dg Chest 2 View  06/25/2013   *RADIOLOGY REPORT*   Clinical Data: Tachycardia, history of HIV, hypertension, substance abuse, initial encounter.  CHEST - 2 VIEW  Comparison: 05/20/2013; 01/04/2012  Findings:  Grossly unchanged cardiac silhouette and mediastinal contours. There are ill-defined heterogeneous interstitial predominant opacities within the bilateral lungs.  These opacities appear to demonstrate a perihilar and slight upper lobe predominance.  No pleural effusion or pneumothorax.  No acute osseous abnormality.  IMPRESSION:  Bilateral perihilar and upper lung predominant interstitial opacities are nonspecific though given history of immunodeficiency are worrisome for multifocal atypical infection.  A follow-up chest radiograph in 4 to 6 weeks after treatment is recommended to ensure resolution.   Original Report Authenticated By: Tacey Ruiz, MD   Ekg:  Sinus tachycardia with a rate of 124 without evidence of ischemia MDM   1. Tachycardia   2. Hallucinations   3. Failure to thrive   4. Fever and chills   5. Human immunodeficiency virus (HIV) disease   6. PNA (pneumonia)    Patient tachycardic but otherwise nontoxic.  Work-up initiated per ID attending including cultures. Fluids started. Patient admitted to hospitalist for further evaluation and management.     Shon Baton, MD 06/25/13 [id number]

## 2013-06-25 NOTE — Progress Notes (Signed)
[facility] NOTE  RFV: sick visit Subjective:    Patient ID: Ryan [name], male    DOB: 1970-12-18, 42 y.o.   MRN: [id number]  HPI 42yo with HIV/AIDS, recently diagnosed with HIV, CD 4 count of 20/VL 1.85M, started stribild last week doing ok thus far.he has significant Bipolar mental illness, including hallucinations.  He also started on taking risperidone yesterday. Still feeling poorly, thought someone was "attacking him to cutting his privates"  He is brought to our clinic in a wheelchair by his mother who he lives with temporarily. She reports him having intermittent fevers of 100-101. had low graded 100.60F yesterday, chills, decreased appetite, nausea. Intermittent constipation. Poor sleep. Has not slept in a few days.  He is only taking: stribilid, risperidone and azithromycin. Unable to fill other medications. He   Soc: he was evicted from his apartment now living with his mother who is attempting to get him medicaid/disability as well as care for him  Current Outpatient Prescriptions on File Prior to Visit  Medication Sig Dispense Refill  . azithromycin (ZITHROMAX) 600 MG tablet Take 2 tablets (1,200 mg total) by mouth every 7 (seven) days.  8 tablet  5  . dexlansoprazole (DEXILANT) 60 MG capsule Take 60 mg by mouth at bedtime.       . elvitegravir-cobicistat-emtricitabine-tenofovir (STRIBILD) 150-150-200-300 MG TABS tablet Take 1 tablet by mouth daily with breakfast.  30 tablet  11  . risperiDONE (RISPERDAL M-TABS) 1 MG disintegrating tablet Take 1 tablet (1 mg total) by mouth 2 (two) times daily.  60 tablet  3  . dapsone 100 MG tablet Take 1 tablet (100 mg total) by mouth daily.  30 tablet  11  . diazepam (VALIUM) 2 MG tablet Take 2 mg by mouth every 12 (twelve) hours as needed for anxiety.      . fluconazole (DIFLUCAN) 200 MG tablet Take 1 tablet (200 mg total) by mouth every 7 (seven) days.  10 tablet  0   No current facility-administered medications on file prior to visit.      Review of Systems     Objective:   Physical Exam BP 100/65  Pulse 147  Temp(Src) 99.1 F (37.3 C) (Oral) Physical Exam  Constitutional: oriented to person, place. Ill appearing, cachetic No distress.  HENT:  Mouth/Throat: Oropharynx is clear and moist. No oropharyngeal exudate. Bi-temporal wasting. Dry oral mucosa. Cardiovascular:tachycardic, regular rhythm and normal heart sounds. Exam reveals no gallop and no friction rub.  No murmur heard.  Pulmonary/Chest: Effort normal and breath sounds normal. No respiratory distress. He has no wheezes.  Abdominal: Soft. Bowel sounds are normal. He exhibits no distension. There is no tenderness.  Lymphadenopathy: + cervical adenopathy.   Skin: Skin is warm and dry. No rash noted. No erythema.  Psychiatric: subdued     Assessment & Plan:   tachycardia with poor po intake/malaise = concern for underlying infection, will admit for evaluation and work-up/treatment. Spoke with Dr. Elvera Lennox for details.  Check cbc with diff, cmp to see if any aki. If cbc is elevated would do empiric antibiotics but target for source. Would check ua to see if uti. Would get blood cultures. cxr to see if any signs of pneumonia (to consider CAP or PCP). Will start IVIF.  Failure to thrive = will need to assess his nutritional intake  HIV = continue with stribild  oi proph = will need to continue with dapsone daily (pt unable to afford), and azithromycin q wk  Bipolar disease  with visual hallucinations = continue with risperidone 0.5 -1mg  BID, but the patient has never been seen by psychiatrist per his report. He previously was cared by internist he was on highdose of seroquel which he is no longer taking. Please get psychiatry assessment and possibly see if candidate for psychiatric hospitalization later on his hospitalizaiton  dispo = will be very difficult since the patient technically is homeless.

## 2013-06-26 DIAGNOSIS — F3130 Bipolar disorder, current episode depressed, mild or moderate severity, unspecified: Secondary | ICD-10-CM

## 2013-06-26 DIAGNOSIS — F319 Bipolar disorder, unspecified: Secondary | ICD-10-CM

## 2013-06-26 LAB — COMPREHENSIVE METABOLIC PANEL
ALT: 12 U/L (ref 0–53)
Albumin: 2.5 g/dL — ABNORMAL LOW (ref 3.5–5.2)
Calcium: 8.8 mg/dL (ref 8.4–10.5)
GFR calc Af Amer: >90 mL/min (ref >90)
Glucose, Bld: 132 mg/dL — ABNORMAL HIGH (ref 70–99)
Sodium: 139 mEq/L (ref 135–145)
Total Protein: 6.6 g/dL (ref 6.0–8.3)

## 2013-06-26 LAB — CBC
HCT: 27.9 % — ABNORMAL LOW (ref 39.0–52.0)
Hemoglobin: 9.4 g/dL — ABNORMAL LOW (ref 13.0–17.0)
MCHC: 33.7 g/dL (ref 30.0–36.0)
MCV: 89.4 fL (ref 78.0–100.0)
WBC: 6.6 10*3/uL (ref 4.0–10.5)

## 2013-06-26 MED ORDER — PNEUMOCOCCAL VAC POLYVALENT 25 MCG/0.5ML IJ INJ
0.5000 mL | INJECTION | INTRAMUSCULAR | Status: AC
  Filled 2013-06-26: qty 0.5

## 2013-06-26 MED ORDER — BOOST / RESOURCE BREEZE PO LIQD
1.0000 | Freq: Three times a day (TID) | ORAL | Status: DC
  Administered 2013-06-26 – 2013-06-29 (×7): 1 via ORAL

## 2013-06-26 MED ORDER — INFLUENZA VAC SPLIT QUAD 0.5 ML IM SUSP
0.5000 mL | INTRAMUSCULAR | Status: AC
  Filled 2013-06-26: qty 0.5

## 2013-06-26 MED ORDER — SODIUM CHLORIDE 0.9 % IV SOLN
INTRAVENOUS | Status: AC
  Administered 2013-06-26: 22:00:00 via INTRAVENOUS

## 2013-06-26 NOTE — Progress Notes (Signed)
[facility] PROGRESS NOTE  Ryan [name] NWG:[id number] DOB: 1971/01/31 DOA: 06/25/2013 PCP: Josue Hector, MD  HPI: Past medical history of HIV with last CD4 count 20 and a viral count of 1.8 and when recently diagnosed and started on Stribil, also past medical history of bipolar disorder with hallucinations started on risperidone 06/24/2013, he currently moved out of his place about a week ago and into his mother as he was having some problem fungal ( which he can not elaborate) with his a.c. at his infectious disease appointment the day of admission he related having intermittent fevers from 100 100.1 yesterday with chills decreased appetite nausea vomiting constipation he has been taking all of his medication except for his dapsone. He relates he has been feeling progressively weaker for the past week but no cough, shortness of breath, no cuts or sores in her mouth pain. Some SOB on exerion.  Assessment/Plan:  Pneumonia - concern for PCP, continue atovaquone, patient is sulfa allergic. Continue steroids.  - sputum studies pending.  - Ceftriaxone/Azitrho empirically  - blood cultures pending.  - afebrile this morning, on room air, still tachycardic but improved.  Bipolar disorder: - very poorly controlled as an outpatient, ongoing hallucinations. Tells me that he has been using more and more of his benzodiazepines at home and he runs out before his next prescription is due thus at one point he had to buy valium of the street. - consult psyq.  Human immunodeficiency virus (HIV) disease  - We'll continue his current regimen of HARRT therapy. FTT - consult nutrition Dispo - was evicted, lives with his mother. Consult case management.  Code Status: Full Family Communication: none  Disposition Plan: inpatient  Consultants:  none  Procedures:  none  Anti-infectives   Start     Dose/Rate Route Frequency Ordered Stop   06/26/13 0800   elvitegravir-cobicistat-emtricitabine-tenofovir (STRIBILD) 150-150-200-300 MG tablet 1 tablet     1 tablet Oral Daily with breakfast 06/25/13 1555     06/26/13 0800  atovaquone (MEPRON) 750 MG/5ML suspension 750 mg     750 mg Oral 2 times daily with meals 06/25/13 1555     06/25/13 1730  azithromycin (ZITHROMAX) 500 mg in dextrose 5 % 250 mL IVPB     500 mg 250 mL/hr over 60 Minutes Intravenous Every 24 hours 06/25/13 1555     06/25/13 1700  cefTRIAXone (ROCEPHIN) 1 g in dextrose 5 % 50 mL IVPB     1 g 100 mL/hr over 30 Minutes Intravenous Every 24 hours 06/25/13 1555     06/25/13 1600  atovaquone (MEPRON) 750 MG/5ML suspension 750 mg  Status:  Discontinued     750 mg Oral  Once 06/25/13 1513 06/25/13 1555     Antibiotics Given (last 72 hours)   Date/Time Action Medication Dose Rate   06/25/13 1706 Given   cefTRIAXone (ROCEPHIN) 1 g in dextrose 5 % 50 mL IVPB 1 g 100 mL/hr   06/25/13 1805 Given   azithromycin (ZITHROMAX) 500 mg in dextrose 5 % 250 mL IVPB 500 mg 250 mL/hr   06/26/13 0910 Given   elvitegravir-cobicistat-emtricitabine-tenofovir (STRIBILD) 150-150-200-300 MG tablet 1 tablet 1 tablet    06/26/13 0910 Given   atovaquone (MEPRON) 750 MG/5ML suspension 750 mg 750 mg       HPI/Subjective: - feels a lot better, slept well through the night  Objective: Filed Vitals:   06/25/13 2129 06/26/13 0427 06/26/13 0500 06/26/13 0650  BP: 104/65   110/67  Pulse: 113  111  Temp: 98.8 F (37.1 C) 97.3 F (36.3 C)  98.4 F (36.9 C)  TempSrc: Oral Oral  Oral  Resp: 24   20  Weight:   64.4 kg (141 lb 15.6 oz)   SpO2: 97%   98%    Intake/Output Summary (Last 24 hours) at 06/26/13 1142 Last data filed at 06/26/13 0601  Gross per 24 hour  Intake   1000 ml  Output   1200 ml  Net   -200 ml   Filed Weights   06/26/13 0500  Weight: 64.4 kg (141 lb 15.6 oz)    Exam:   General:  NAD  Cardiovascular: regular rate and rhythm, without MRG  Respiratory: good air movement,  clear to auscultation   Abdomen: soft, not tender to palpation, positive bowel sounds  MSK: no peripheral edema  Neuro: non focal  Data Reviewed: Basic Metabolic Panel:  Recent Labs Lab 06/25/13 1407 06/26/13 0547  NA 134* 139  K 4.0 4.2  CL 100 108  CO2 24 20  GLUCOSE 90 132*  BUN 11 11  CREATININE 1.01 0.75  CALCIUM 8.7 8.8   Liver Function Tests:  Recent Labs Lab 06/25/13 1407 06/26/13 0547  AST 15 13  ALT 15 12  ALKPHOS 70 66  BILITOT 0.3 0.3  PROT 6.7 6.6  ALBUMIN 2.6* 2.5*   CBC:  Recent Labs Lab 06/25/13 1407 06/26/13 0547  WBC 8.5 6.6  NEUTROABS 6.6  --   HGB 9.3* 9.4*  HCT 27.6* 27.9*  MCV 89.3 89.4  PLT 202 199   Studies: Dg Chest 2 View  06/25/2013   *RADIOLOGY REPORT*  Clinical Data: Tachycardia, history of HIV, hypertension, substance abuse, initial encounter.  CHEST - 2 VIEW  Comparison: 05/20/2013; 01/04/2012  Findings:  Grossly unchanged cardiac silhouette and mediastinal contours. There are ill-defined heterogeneous interstitial predominant opacities within the bilateral lungs.  These opacities appear to demonstrate a perihilar and slight upper lobe predominance.  No pleural effusion or pneumothorax.  No acute osseous abnormality.  IMPRESSION:  Bilateral perihilar and upper lung predominant interstitial opacities are nonspecific though given history of immunodeficiency are worrisome for multifocal atypical infection.  A follow-up chest radiograph in 4 to 6 weeks after treatment is recommended to ensure resolution.   Original Report Authenticated By: Tacey Ruiz, MD    Scheduled Meds: . atovaquone  750 mg Oral BID WC  . azithromycin  500 mg Intravenous Q24H  . cefTRIAXone (ROCEPHIN)  IV  1 g Intravenous Q24H  . elvitegravir-cobicistat-emtricitabine-tenofovir  1 tablet Oral Q breakfast  . heparin  5,000 Units Subcutaneous Q8H  . [START ON 06/27/2013] influenza vac split quadrivalent PF  0.5 mL Intramuscular Tomorrow-1000  . [START ON  06/27/2013] pneumococcal 23 valent vaccine  0.5 mL Intramuscular Tomorrow-1000  . predniSONE  50 mg Oral BID WC  . risperiDONE  1 mg Oral BID   Continuous Infusions:   Principal Problem:   Fever and chills Active Problems:   Bipolar disorder   Human immunodeficiency virus (HIV) disease   PNA (pneumonia)  Time spent: 39  Pamella Pert, MD [facility] Pager [phone]. If 7 PM - 7 AM, please contact night-coverage at [email or web address], password [facility] 06/26/2013, 11:42 AM  LOS: 1 day

## 2013-06-26 NOTE — Consult Note (Signed)
[facility] Face-to-Face Psychiatry Consult   Reason for Consult:  bipolar Referring Physician:  Dr. Dessie Coma is an 42 y.o. male.  Assessment: AXIS I:  Bipolar, Depressed AXIS II:  Deferred AXIS III:   Past Medical History  Diagnosis Date  . Hypertension   . Bipolar disorder   . Depressed   . HIV (human immunodeficiency virus infection)   . H/O: substance abuse 06/09/2013    Crack cocaine,  marijuana   AXIS IV:  economic problems, housing problems and other psychosocial or environmental problems AXIS V:  41-50 serious symptoms  Plan:  No evidence of imminent risk to self or others at present.    Subjective:   Ryan [name] is a 42 y.o. male patient admitted with fever and medical stabilization.  HPI:  42 years old White male has been living with his Mom. He was in bed and appears not a good historian. Gives remote history of treatment for Bipolar by Dr. Sharyon Medicus. No regular follow up. Has been on risperdal before. Gives remote history of hospital admission for mood swings. Says he would excess energy, would run, not sleep and distracted.   Currently under treatment for HIV. Denies delusions or hallucinations recently. Past history of hallucinations and non compliance with meds. HPI Elements:   Location:  hospital. Severity:  moderate.  Past Psychiatric History: Past Medical History  Diagnosis Date  . Hypertension   . Bipolar disorder   . Depressed   . HIV (human immunodeficiency virus infection)   . H/O: substance abuse 06/09/2013    Crack cocaine,  marijuana    reports that he has never smoked. He does not have any smokeless tobacco history on file. He reports that he uses illicit drugs (Cocaine, Marijuana, and Other-see comments). He reports that he does not drink alcohol. Family History  Problem Relation Age of Onset  . Esophageal cancer Father            Allergies:   Allergies  Allergen Reactions  . Levaquin [Levofloxacin In D5w] Other (See Comments)   Pain & nerve damage in fingers  . Sulfa Antibiotics Rash    ACT Assessment Complete:  No:   Past Psychiatric History: Diagnosis:  Bipolar  Hospitalizations:  Yes in past  Outpatient Care:  Not regular  Substance Abuse Care:  denies  Self-Mutilation:  denies  Suicidal Attempts:  Did not elaborate  Homicidal Behaviors:  Denies    Violent Behaviors:  denies   Place of Residence:  Home with mom Marital Status:   Employed/Unemployed:  unemployed Education:   Family Supports:  mom Objective: Blood pressure 105/68, pulse 97, temperature 97.5 F (36.4 C), temperature source Oral, resp. rate 16, weight 65.046 kg (143 lb 6.4 oz), SpO2 98.00%.Body mass index is 21.17 kg/(m^2). Results for orders placed during the hospital encounter of 06/25/13 (from the past 72 hour(s))  CBC WITH DIFFERENTIAL     Status: Abnormal   Collection Time    06/25/13  2:07 PM      Result Value Range   WBC 8.5  4.0 - 10.5 K/uL   RBC 3.09 (*) 4.22 - 5.81 MIL/uL   Hemoglobin 9.3 (*) 13.0 - 17.0 g/dL   HCT 16.1 (*) 09.6 - 04.5 %   MCV 89.3  78.0 - 100.0 fL   MCH 30.1  26.0 - 34.0 pg   MCHC 33.7  30.0 - 36.0 g/dL   RDW 40.9  81.1 - 91.4 %   Platelets 202  150 -  400 K/uL   Neutrophils Relative % 77  43 - 77 %   Neutro Abs 6.6  1.7 - 7.7 K/uL   Lymphocytes Relative 11 (*) 12 - 46 %   Lymphs Abs 0.9  0.7 - 4.0 K/uL   Monocytes Relative 10  3 - 12 %   Monocytes Absolute 0.9  0.1 - 1.0 K/uL   Eosinophils Relative 1  0 - 5 %   Eosinophils Absolute 0.1  0.0 - 0.7 K/uL   Basophils Relative 0  0 - 1 %   Basophils Absolute 0.0  0.0 - 0.1 K/uL  COMPREHENSIVE METABOLIC PANEL     Status: Abnormal   Collection Time    06/25/13  2:07 PM      Result Value Range   Sodium 134 (*) 135 - 145 mEq/L   Potassium 4.0  3.5 - 5.1 mEq/L   Chloride 100  96 - 112 mEq/L   CO2 24  19 - 32 mEq/L   Glucose, Bld 90  70 - 99 mg/dL   BUN 11  6 - 23 mg/dL   Creatinine, Ser 1.61  0.50 - 1.35 mg/dL   Calcium 8.7  8.4 - 09.6 mg/dL   Total  Protein 6.7  6.0 - 8.3 g/dL   Albumin 2.6 (*) 3.5 - 5.2 g/dL   AST 15  0 - 37 U/L   ALT 15  0 - 53 U/L   Alkaline Phosphatase 70  39 - 117 U/L   Total Bilirubin 0.3  0.3 - 1.2 mg/dL   GFR calc non Af Amer >90  >90 mL/min   GFR calc Af Amer >90  >90 mL/min   Comment: (NOTE)     The eGFR has been calculated using the CKD EPI equation.     This calculation has not been validated in all clinical situations.     eGFR's persistently <90 mL/min signify possible Chronic Kidney     Disease.  LACTIC ACID, PLASMA     Status: None   Collection Time    06/25/13  2:44 PM      Result Value Range   Lactic Acid, Venous 1.3  0.5 - 2.2 mmol/L  CULTURE, BLOOD (ROUTINE X 2)     Status: None   Collection Time    06/25/13  3:09 PM      Result Value Range   Specimen Description BLOOD ARM RIGHT     Special Requests BOTTLES DRAWN AEROBIC AND ANAEROBIC 10CC     Culture  Setup Time       Value: 06/25/2013 21:40     Performed at [facility]   Culture       Value:        BLOOD CULTURE RECEIVED NO GROWTH TO DATE CULTURE WILL BE HELD FOR 5 DAYS BEFORE ISSUING A FINAL NEGATIVE REPORT     Performed at [facility]   Report Status PENDING    CULTURE, BLOOD (ROUTINE X 2)     Status: None   Collection Time    06/25/13  3:09 PM      Result Value Range   Specimen Description BLOOD FOREARM RIGHT     Special Requests BOTTLES DRAWN AEROBIC AND ANAEROBIC 5CC     Culture  Setup Time       Value: 06/25/2013 21:39     Performed at [facility]   Culture       Value:        BLOOD CULTURE RECEIVED NO  GROWTH TO DATE CULTURE WILL BE HELD FOR 5 DAYS BEFORE ISSUING A FINAL NEGATIVE REPORT     Performed at [facility]   Report Status PENDING    POCT I-STAT 3, BLOOD GAS (G3+)     Status: Abnormal   Collection Time    06/25/13  3:29 PM      Result Value Range   pH, Arterial 7.432  7.350 - 7.450   pCO2 arterial 33.3 (*) 35.0 - 45.0 mmHg   pO2, Arterial 74.0 (*) 80.0 - 100.0 mmHg    Bicarbonate 21.8  20.0 - 24.0 mEq/L   TCO2 23  0 - 100 mmol/L   O2 Saturation 94.0     Acid-base deficit 2.0  0.0 - 2.0 mmol/L   Patient temperature 102.0 F     Collection site RADIAL, ALLEN'S TEST ACCEPTABLE     Drawn by Operator     Sample type ARTERIAL    SEDIMENTATION RATE     Status: Abnormal   Collection Time    06/25/13  4:30 PM      Result Value Range   Sed Rate 77 (*) 0 - 16 mm/hr  URINALYSIS, ROUTINE W REFLEX MICROSCOPIC     Status: None   Collection Time    06/25/13  4:32 PM      Result Value Range   Color, Urine YELLOW  YELLOW   APPearance CLEAR  CLEAR   Specific Gravity, Urine 1.020  1.005 - 1.030   pH 7.0  5.0 - 8.0   Glucose, UA NEGATIVE  NEGATIVE mg/dL   Hgb urine dipstick NEGATIVE  NEGATIVE   Bilirubin Urine NEGATIVE  NEGATIVE   Ketones, ur NEGATIVE  NEGATIVE mg/dL   Protein, ur NEGATIVE  NEGATIVE mg/dL   Urobilinogen, UA 0.2  0.0 - 1.0 mg/dL   Nitrite NEGATIVE  NEGATIVE   Leukocytes, UA NEGATIVE  NEGATIVE   Comment: MICROSCOPIC NOT DONE ON URINES WITH NEGATIVE PROTEIN, BLOOD, LEUKOCYTES, NITRITE, OR GLUCOSE <1000 mg/dL.  COMPREHENSIVE METABOLIC PANEL     Status: Abnormal   Collection Time    06/26/13  5:47 AM      Result Value Range   Sodium 139  135 - 145 mEq/L   Potassium 4.2  3.5 - 5.1 mEq/L   Chloride 108  96 - 112 mEq/L   CO2 20  19 - 32 mEq/L   Glucose, Bld 132 (*) 70 - 99 mg/dL   BUN 11  6 - 23 mg/dL   Creatinine, Ser 4.09  0.50 - 1.35 mg/dL   Calcium 8.8  8.4 - 81.1 mg/dL   Total Protein 6.6  6.0 - 8.3 g/dL   Albumin 2.5 (*) 3.5 - 5.2 g/dL   AST 13  0 - 37 U/L   ALT 12  0 - 53 U/L   Alkaline Phosphatase 66  39 - 117 U/L   Total Bilirubin 0.3  0.3 - 1.2 mg/dL   GFR calc non Af Amer >90  >90 mL/min   GFR calc Af Amer >90  >90 mL/min   Comment: (NOTE)     The eGFR has been calculated using the CKD EPI equation.     This calculation has not been validated in all clinical situations.     eGFR's persistently <90 mL/min signify possible Chronic  Kidney     Disease.  CBC     Status: Abnormal   Collection Time    06/26/13  5:47 AM      Result Value Range  WBC 6.6  4.0 - 10.5 K/uL   RBC 3.12 (*) 4.22 - 5.81 MIL/uL   Hemoglobin 9.4 (*) 13.0 - 17.0 g/dL   HCT 16.1 (*) 09.6 - 04.5 %   MCV 89.4  78.0 - 100.0 fL   MCH 30.1  26.0 - 34.0 pg   MCHC 33.7  30.0 - 36.0 g/dL   RDW 40.9  81.1 - 91.4 %   Platelets 199  150 - 400 K/uL   Labs are reviewed and are pertinent. See medical notes.  Current Facility-Administered Medications  Medication Dose Route Frequency Provider Last Rate Last Dose  . 0.9 %  sodium chloride infusion   Intravenous Continuous Pamella Pert, MD 100 mL/hr at 06/26/13 1247    . acetaminophen (TYLENOL) tablet 650 mg  650 mg Oral Q6H PRN Marinda Elk, MD       Or  . acetaminophen (TYLENOL) suppository 650 mg  650 mg Rectal Q6H PRN Marinda Elk, MD      . atovaquone [facility]) 750 MG/5ML suspension 750 mg  750 mg Oral BID WC Marinda Elk, MD   750 mg at 06/26/13 0910  . azithromycin (ZITHROMAX) 500 mg in dextrose 5 % 250 mL IVPB  500 mg Intravenous Q24H Marinda Elk, MD   500 mg at 06/25/13 1805  . cefTRIAXone (ROCEPHIN) 1 g in dextrose 5 % 50 mL IVPB  1 g Intravenous Q24H Marinda Elk, MD   1 g at 06/25/13 1706  . diazepam (VALIUM) tablet 2 mg  2 mg Oral Q12H PRN Marinda Elk, MD      . elvitegravir-cobicistat-emtricitabine-tenofovir (STRIBILD) 150-150-200-300 MG tablet 1 tablet  1 tablet Oral Q breakfast Marinda Elk, MD   1 tablet at 06/26/13 0910  . heparin injection 5,000 Units  5,000 Units Subcutaneous Q8H Marinda Elk, MD   5,000 Units at 06/26/13 0601  . [START ON 06/27/2013] influenza vac split quadrivalent PF (FLUARIX) injection 0.5 mL  0.5 mL Intramuscular Tomorrow-1000 Costin Gherghe, MD      . ondansetron (ZOFRAN) tablet 4 mg  4 mg Oral Q6H PRN Marinda Elk, MD       Or  . ondansetron [facility]) injection 4 mg  4 mg Intravenous Q6H PRN Marinda Elk, MD      . Melene Muller ON 06/27/2013] pneumococcal 23 valent vaccine (PNU-IMMUNE) injection 0.5 mL  0.5 mL Intramuscular Tomorrow-1000 Costin Gherghe, MD      . polyethylene glycol (MIRALAX / GLYCOLAX) packet 17 g  17 g Oral Daily PRN Marinda Elk, MD      . predniSONE (DELTASONE) tablet 50 mg  50 mg Oral BID WC Marinda Elk, MD   50 mg at 06/26/13 0910  . risperiDONE (RISPERDAL M-TABS) disintegrating tablet 1 mg  1 mg Oral BID Marinda Elk, MD   1 mg at 06/26/13 1047    Psychiatric Specialty Exam:     Blood pressure 105/68, pulse 97, temperature 97.5 F (36.4 C), temperature source Oral, resp. rate 16, weight 65.046 kg (143 lb 6.4 oz), SpO2 98.00%.Body mass index is 21.17 kg/(m^2).  General Appearance: Disheveled  Eye Contact::  Minimal  Speech:  Slow  Volume:  Decreased  Mood:  Dysphoric  Affect:  Constricted  Thought Process:  linear  Orientation:  Other:  time and place  Thought Content:  Paranoid Ideation  Suicidal Thoughts:  No  Homicidal Thoughts:  No  Memory:  Recent;   Fair  Judgement:  Fair  Insight:  Lacking  Psychomotor Activity:  Decreased  Concentration:  Fair  Recall:  Poor  Akathisia:  No  Handed:  Right  AIMS (if indicated):     Assets:  Social Support  Sleep:      Treatment Plan Summary: Medication management with Risperdal. Can consider night dose to increase to 2mg .  Would recommend making follow up psychiatry outpatient clinic once stablized for long term management and compliance.   Ryan [name] 06/26/2013 2:19 PM

## 2013-06-26 NOTE — Progress Notes (Signed)
INITIAL NUTRITION ASSESSMENT  DOCUMENTATION CODES Per approved criteria  -Not Applicable   INTERVENTION: Resource Breeze po TID, each supplement provides 250 kcal and 9 grams of protein.  NUTRITION DIAGNOSIS: Inadequate oral intake related to poor appetite as evidenced by 17% weight loss in the past 3 months.   Goal: Intake to meet >90% of estimated nutrition needs.  Monitor:  PO intake, labs, weight trend.  Reason for Assessment: MD Consult regarding poor PO intake  42 y.o. male  Admitting Dx: Fever and chills  ASSESSMENT: Patient admitted 9/19 with fever. Currently being treated for HIV. Recent history of decreased appetite, nausea, vomiting, and constipation. History of polysubstance abuse noted. Unable to speak with patient at this time. Patient with 17% weight loss in the past 3 months, suspect severe malnutrition. Consumed 15% of lunch today.  Height: Ht Readings from Last 1 Encounters:  06/18/13 5\' 9"  (1.753 m)    Weight: Wt Readings from Last 1 Encounters:  06/26/13 141 lb 15.6 oz (64.4 kg)    Ideal Body Weight: 72.7 kg  % Ideal Body Weight: 89%  Wt Readings from Last 10 Encounters:  06/26/13 141 lb 15.6 oz (64.4 kg)  06/18/13 151 lb (68.493 kg)  06/11/13 147 lb (66.679 kg)  06/10/13 151 lb (68.493 kg)  05/25/13 156 lb (70.761 kg)  05/20/13 156 lb 4.9 oz (70.9 kg)  03/11/13 169 lb (76.658 kg)  01/09/12 180 lb (81.647 kg)  01/04/12 200 lb 6.4 oz (90.9 kg)    Usual Body Weight: 169 lb  % Usual Body Weight: 83%  BMI:  Body mass index is 20.96 kg/(m^2).  Estimated Nutritional Needs: Kcal: 2200-2400 Protein: 100-120 gm Fluid: 2.2-2.4 L  Skin: no wounds  Diet Order: General  EDUCATION NEEDS: -Education not appropriate at this time   Intake/Output Summary (Last 24 hours) at 06/26/13 1332 Last data filed at 06/26/13 0601  Gross per 24 hour  Intake   1000 ml  Output   1200 ml  Net   -200 ml    Last BM: none documented    Labs:   Recent Labs Lab 06/25/13 1407 06/26/13 0547  NA 134* 139  K 4.0 4.2  CL 100 108  CO2 24 20  BUN 11 11  CREATININE 1.01 0.75  CALCIUM 8.7 8.8  GLUCOSE 90 132*    CBG (last 3)  No results found for this basename: GLUCAP,  in the last 72 hours  Scheduled Meds: . atovaquone  750 mg Oral BID WC  . azithromycin  500 mg Intravenous Q24H  . cefTRIAXone (ROCEPHIN)  IV  1 g Intravenous Q24H  . elvitegravir-cobicistat-emtricitabine-tenofovir  1 tablet Oral Q breakfast  . heparin  5,000 Units Subcutaneous Q8H  . [START ON 06/27/2013] influenza vac split quadrivalent PF  0.5 mL Intramuscular Tomorrow-1000  . [START ON 06/27/2013] pneumococcal 23 valent vaccine  0.5 mL Intramuscular Tomorrow-1000  . predniSONE  50 mg Oral BID WC  . risperiDONE  1 mg Oral BID    Continuous Infusions: . sodium chloride 100 mL/hr at 06/26/13 1247    Past Medical History  Diagnosis Date  . Hypertension   . Bipolar disorder   . Depressed   . HIV (human immunodeficiency virus infection)   . H/O: substance abuse 06/09/2013    Crack cocaine,  marijuana    Past Surgical History  Procedure Laterality Date  . Finger surgery      Joaquin Courts, RD, LDN, CNSC Pager [phone] After Hours Pager [phone]

## 2013-06-27 DIAGNOSIS — Z5900 Homelessness unspecified: Secondary | ICD-10-CM

## 2013-06-27 DIAGNOSIS — D649 Anemia, unspecified: Secondary | ICD-10-CM | POA: Diagnosis present

## 2013-06-27 DIAGNOSIS — R531 Weakness: Secondary | ICD-10-CM | POA: Diagnosis present

## 2013-06-27 DIAGNOSIS — F411 Generalized anxiety disorder: Secondary | ICD-10-CM

## 2013-06-27 DIAGNOSIS — R7989 Other specified abnormal findings of blood chemistry: Secondary | ICD-10-CM | POA: Diagnosis present

## 2013-06-27 LAB — TSH: TSH: 1.091 u[IU]/mL (ref 0.350–4.500)

## 2013-06-27 LAB — URINE CULTURE: Culture: NO GROWTH

## 2013-06-27 LAB — T3, FREE: T3, Free: 1.9 pg/mL — ABNORMAL LOW (ref 2.3–4.2)

## 2013-06-27 MED ORDER — PANTOPRAZOLE SODIUM 40 MG PO TBEC
40.0000 mg | DELAYED_RELEASE_TABLET | Freq: Every day | ORAL | Status: DC
  Administered 2013-06-27 – 2013-06-29 (×3): 40 mg via ORAL
  Filled 2013-06-27 (×3): qty 1

## 2013-06-27 MED ORDER — RISPERIDONE 2 MG PO TABS
2.0000 mg | ORAL_TABLET | Freq: Every day | ORAL | Status: DC
  Administered 2013-06-27 – 2013-06-28 (×2): 2 mg via ORAL
  Filled 2013-06-27 (×3): qty 1

## 2013-06-27 MED ORDER — AZITHROMYCIN 500 MG PO TABS
500.0000 mg | ORAL_TABLET | Freq: Every day | ORAL | Status: DC
  Administered 2013-06-27 – 2013-06-29 (×3): 500 mg via ORAL
  Filled 2013-06-27 (×4): qty 1

## 2013-06-27 MED ORDER — RISPERIDONE 1 MG PO TBDP
1.0000 mg | ORAL_TABLET | Freq: Every day | ORAL | Status: DC
  Administered 2013-06-28 – 2013-06-29 (×2): 1 mg via ORAL
  Filled 2013-06-27 (×2): qty 1

## 2013-06-27 MED ORDER — DIAZEPAM 2 MG PO TABS
2.0000 mg | ORAL_TABLET | Freq: Every evening | ORAL | Status: DC | PRN
  Administered 2013-06-27 – 2013-06-28 (×2): 2 mg via ORAL
  Filled 2013-06-27 (×2): qty 1

## 2013-06-27 NOTE — Progress Notes (Signed)
[facility] PROGRESS NOTE  Ryan [name] ZHY:[id number] DOB: 19-May-1971 DOA: 06/25/2013 PCP: Josue Hector, MD  HPI: Past medical history of HIV with last CD4 count 20 and a viral count of 1.8 and when recently diagnosed and started on Stribil, also past medical history of bipolar disorder with hallucinations started on risperidone 06/24/2013, he currently moved out of his place about a week ago and into his mother as he was having some problem fungal ( which he can not elaborate) with his a.c. at his infectious disease appointment the day of admission he related having intermittent fevers from 100 100.1 yesterday with chills decreased appetite nausea vomiting constipation he has been taking all of his medication except for his dapsone. He relates he has been feeling progressively weaker for the past week but no cough, shortness of breath, no cuts or sores in her mouth pain. Some SOB on exerion.  Assessment/Plan:  Pneumonia - concern for PCP, continue atovaquone, patient is sulfa allergic. Continue steroids.  - sputum studies pending.  - Ceftriaxone/Azitrho empirically  - blood cultures pending.  Also check urine legionella and strep  Bipolar disorder:  Denies hallucinations currently.  Increase risperdal to 2 qhs per psych recs.  Human immunodeficiency virus (HIV) disease  - We'll continue his current regimen of HARRT therapy.  FTT - consult nutrition  Dispo mother at bedside says patient can no longer live with her.  Will consult social work.  Also, will need CM consult for medication needs post discharge  Code Status: Full Family Communication: mother at bedside Disposition Plan: ?homeless shelter?  Consultants:  none  Procedures:  none  Anti-infectives   Start     Dose/Rate Route Frequency Ordered Stop   06/27/13 1600  azithromycin (ZITHROMAX) tablet 500 mg     500 mg Oral Daily 06/27/13 1115     06/26/13 0800   elvitegravir-cobicistat-emtricitabine-tenofovir (STRIBILD) 150-150-200-300 MG tablet 1 tablet     1 tablet Oral Daily with breakfast 06/25/13 1555     06/26/13 0800  atovaquone (MEPRON) 750 MG/5ML suspension 750 mg     750 mg Oral 2 times daily with meals 06/25/13 1555     06/25/13 1730  azithromycin (ZITHROMAX) 500 mg in dextrose 5 % 250 mL IVPB  Status:  Discontinued     500 mg 250 mL/hr over 60 Minutes Intravenous Every 24 hours 06/25/13 1555 06/27/13 1115   06/25/13 1700  cefTRIAXone (ROCEPHIN) 1 g in dextrose 5 % 50 mL IVPB     1 g 100 mL/hr over 30 Minutes Intravenous Every 24 hours 06/25/13 1555     06/25/13 1600  atovaquone (MEPRON) 750 MG/5ML suspension 750 mg  Status:  Discontinued     750 mg Oral  Once 06/25/13 1513 06/25/13 1555     Antibiotics Given (last 72 hours)   Date/Time Action Medication Dose Rate   06/25/13 1706 Given   cefTRIAXone (ROCEPHIN) 1 g in dextrose 5 % 50 mL IVPB 1 g 100 mL/hr   06/25/13 1805 Given   azithromycin (ZITHROMAX) 500 mg in dextrose 5 % 250 mL IVPB 500 mg 250 mL/hr   06/26/13 0910 Given   elvitegravir-cobicistat-emtricitabine-tenofovir (STRIBILD) 150-150-200-300 MG tablet 1 tablet 1 tablet    06/26/13 0910 Given   atovaquone (MEPRON) 750 MG/5ML suspension 750 mg 750 mg    06/26/13 1622 Given   cefTRIAXone (ROCEPHIN) 1 g in dextrose 5 % 50 mL IVPB 1 g 100 mL/hr   06/26/13 1741 Given   atovaquone (MEPRON) 750 MG/5ML suspension  750 mg 750 mg    06/26/13 1741 Given   azithromycin (ZITHROMAX) 500 mg in dextrose 5 % 250 mL IVPB 500 mg 250 mL/hr   06/27/13 0935 Given   elvitegravir-cobicistat-emtricitabine-tenofovir (STRIBILD) 150-150-200-300 MG tablet 1 tablet 1 tablet    06/27/13 0935 Given   atovaquone (MEPRON) 750 MG/5ML suspension 750 mg 750 mg       HPI/Subjective: Feels sluggish.  No dyspnea. Slight cough  Objective: Filed Vitals:   06/26/13 1340 06/26/13 1344 06/26/13 2148 06/27/13 0613  BP:  105/68 112/76 115/65  Pulse:  97 77 96   Temp:  97.5 F (36.4 C) 97.2 F (36.2 C) 97.8 F (36.6 C)  TempSrc:  Oral Oral Oral  Resp:  16 18 18   Weight: 65.046 kg (143 lb 6.4 oz)     SpO2:   98% 98%    Intake/Output Summary (Last 24 hours) at 06/27/13 1122 Last data filed at 06/27/13 4540  Gross per 24 hour  Intake    480 ml  Output   2352 ml  Net  -1872 ml   Filed Weights   06/26/13 0500 06/26/13 1340  Weight: 64.4 kg (141 lb 15.6 oz) 65.046 kg (143 lb 6.4 oz)    Exam:   General:  NAD  Cardiovascular: regular rate and rhythm, without MRG  Respiratory: good air movement, clear to auscultation   Abdomen: soft, not tender to palpation, positive bowel sounds  MSK: no peripheral edema  Psych: cooperative. Does not appear to be hallucinating  Data Reviewed: Basic Metabolic Panel:  Recent Labs Lab 06/25/13 1407 06/26/13 0547  NA 134* 139  K 4.0 4.2  CL 100 108  CO2 24 20  GLUCOSE 90 132*  BUN 11 11  CREATININE 1.01 0.75  CALCIUM 8.7 8.8   Liver Function Tests:  Recent Labs Lab 06/25/13 1407 06/26/13 0547  AST 15 13  ALT 15 12  ALKPHOS 70 66  BILITOT 0.3 0.3  PROT 6.7 6.6  ALBUMIN 2.6* 2.5*   CBC:  Recent Labs Lab 06/25/13 1407 06/26/13 0547  WBC 8.5 6.6  NEUTROABS 6.6  --   HGB 9.3* 9.4*  HCT 27.6* 27.9*  MCV 89.3 89.4  PLT 202 199   Studies: Dg Chest 2 View  06/25/2013   *RADIOLOGY REPORT*  Clinical Data: Tachycardia, history of HIV, hypertension, substance abuse, initial encounter.  CHEST - 2 VIEW  Comparison: 05/20/2013; 01/04/2012  Findings:  Grossly unchanged cardiac silhouette and mediastinal contours. There are ill-defined heterogeneous interstitial predominant opacities within the bilateral lungs.  These opacities appear to demonstrate a perihilar and slight upper lobe predominance.  No pleural effusion or pneumothorax.  No acute osseous abnormality.  IMPRESSION:  Bilateral perihilar and upper lung predominant interstitial opacities are nonspecific though given history of  immunodeficiency are worrisome for multifocal atypical infection.  A follow-up chest radiograph in 4 to 6 weeks after treatment is recommended to ensure resolution.   Original Report Authenticated By: Tacey Ruiz, MD    Scheduled Meds: . atovaquone  750 mg Oral BID WC  . azithromycin  500 mg Oral Daily  . cefTRIAXone (ROCEPHIN)  IV  1 g Intravenous Q24H  . elvitegravir-cobicistat-emtricitabine-tenofovir  1 tablet Oral Q breakfast  . feeding supplement  1 Container Oral TID BM  . heparin  5,000 Units Subcutaneous Q8H  . influenza vac split quadrivalent PF  0.5 mL Intramuscular Tomorrow-1000  . pneumococcal 23 valent vaccine  0.5 mL Intramuscular Tomorrow-1000  . predniSONE  50 mg Oral BID  WC  . [START ON 06/28/2013] risperiDONE  1 mg Oral Daily  . risperiDONE  2 mg Oral QHS   Continuous Infusions:  Time spent: 17  Crista Curb, MD [facility] Pager [phone]. If 7 PM - 7 AM, please contact night-coverage at [email or web address], password [facility] 06/27/2013, 11:22 AM  LOS: 2 days

## 2013-06-27 NOTE — Progress Notes (Signed)
   CARE MANAGEMENT NOTE 06/27/2013  Patient:  EVERETTE, MALL   Account Number:  [id number]  Date Initiated:  06/27/2013  Documentation initiated by:  [facility]  Subjective/Objective Assessment:   [id number] fevers from 100 100.     Action/Plan:   discharge planning   Anticipated DC Date:  06/30/2013   Anticipated DC Plan:    In-house referral  Clinical Social Worker         Choice offered to / List presented to:             Status of service:  In process, will continue to follow Medicare Important Message given?   (If response is "NO", the following Medicare IM given date fields will be blank) Date Medicare IM given:   Date Additional Medicare IM given:    Discharge Disposition:    Per UR Regulation:    If discussed at Long Length of Stay Meetings, dates discussed:    Comments:  06/27/13 09:15 CM notified CSW for resources for shelter as pt states his mother has kicked him out.  Will continue to follow for discharge needs.  Freddy Jaksch, BSN, CM [phone].

## 2013-06-28 ENCOUNTER — Telehealth: Payer: Self-pay | Admitting: *Deleted

## 2013-06-28 LAB — STREP PNEUMONIAE URINARY ANTIGEN: Strep Pneumo Urinary Antigen: NEGATIVE

## 2013-06-28 MED ORDER — PREDNISONE 20 MG PO TABS: ORAL_TABLET | ORAL | Status: DC

## 2013-06-28 MED ORDER — CEFUROXIME AXETIL 500 MG PO TABS: 500.0000 mg | ORAL_TABLET | Freq: Two times a day (BID) | ORAL | Status: DC

## 2013-06-28 MED ORDER — ATOVAQUONE 750 MG/5ML PO SUSP: 750.0000 mg | Freq: Two times a day (BID) | ORAL | Status: DC

## 2013-06-28 MED ORDER — ACETAMINOPHEN 325 MG PO TABS: 650.0000 mg | ORAL_TABLET | Freq: Four times a day (QID) | ORAL | Status: DC | PRN

## 2013-06-28 MED ORDER — RISPERIDONE 1 MG PO TBDP: ORAL_TABLET | ORAL | Status: DC

## 2013-06-28 MED ORDER — AZITHROMYCIN 250 MG PO TABS: 250.0000 mg | ORAL_TABLET | Freq: Every day | ORAL | Status: DC

## 2013-06-28 NOTE — Evaluation (Signed)
I have reviewed this note and agree with all findings. Kati Shmiel Morton, PT, DPT Pager: [phone]   

## 2013-06-28 NOTE — Progress Notes (Signed)
[facility]  SURGICAL [id number] [place] [address] Phone: [phone]  June 28, 2013  Patient: Ryan [name]  Date of Birth: Mar 04, 1971  Date of Visit: 06/25/2013    To Whom It May Concern:  Aristotle Lieb was seen and treated in our emergency department on 06/25/2013. Lorette Ang.  He is unable to work currently due to medical condition.  Sincerely,      Crista Curb, M.D.

## 2013-06-28 NOTE — Evaluation (Signed)
Physical Therapy Evaluation and Acute Care PT Discharge Patient Details Name: Ryan [name] MRN: [id number] DOB: [birth year] Today's Date: 06/28/2013 Time: 4098-1191 PT Time Calculation (min): 20 min  PT Assessment / Plan / Recommendation History of Present Illness  42 y/o male, with past medical history of HIV with last CD4 count 20 and a viral count of 1.8 and when recently diagnosed and started on Stribil, also past medical history of bipolar disorder with hallucinations started on risperidone 06/24/2013, he currently moved out of his place about a week ago and into his mother as he was having some problem fungal ( which he can not elaborate) with his a.c. at his infectious disease appointment the day of admission he related having intermittent fevers from 100 100.1 yesterday with chills decreased appetite nausea vomiting constipation he has been taking all of his medication except for his dapsone. He relates he has been feeling progressively weaker for the past week but no cough, shortness of breath, no cuts or sores in her mouth pain. Some SOB on exerion.  Clinical Impression  Pt admitted with fever and chills. Pt able to ambulate 300' and transfer with supervision and perform all bed mobility at MOD I. Pt reports his mother kicked him out of the house and so he does not have anywhere to live.  PT reassured pt case management would discuss d/c location with pt.  Pt reports he occasionally uses a cane, RW, and wheelchair for mobility, he states that he borrows equipment and does not own any.  PT does not feel the pt requires assistive devices to amb at this time.  Pt does not need further PT as he reports that he's back to his baseline and is supervision-MOD I for all mobility, pt would benefit from ambulating in hall with RN.     PT Assessment  Patent does not need any further PT services    Follow Up Recommendations  No PT follow up    Does the patient have the potential to tolerate intense  rehabilitation      Barriers to Discharge        Equipment Recommendations  None recommended by PT    Recommendations for Other Services     Frequency      Precautions / Restrictions Precautions Precautions: Fall Restrictions Weight Bearing Restrictions: No   Pertinent Vitals/Pain No c/o of pain, pt reports he just "feels weak".  However, PT assess bil LE strength and pt strength 4-5/5 in all muscle groups.      Mobility  Bed Mobility Bed Mobility: Supine to Sit;Sitting - Scoot to Edge of Bed Supine to Sit: 6: Modified independent (Device/Increase time) Sitting - Scoot to Edge of Bed: 6: Modified independent (Device/Increase time) Details for Bed Mobility Assistance: Mod I due to increased time. Pt was able to donn pants and shirt in sitting without assistance. Transfers Transfers: Sit to Stand;Stand to Sit Sit to Stand: 5: Supervision Stand to Sit: 5: Supervision Details for Transfer Assistance: Supervision for safety as pt reports he feels weak. Ambulation/Gait Ambulation/Gait Assistance: 5: Supervision Ambulation Distance (Feet): 300 Feet Assistive device: None Ambulation/Gait Assistance Details: Supervision to ensure safety, as pt initially with scissoring gait but able to self correct without LOB. Pt able to negotiate objects without guidance or assist.  PT encouraged pt to amb. with staff during acute stay and progress mobility as tolerated. Gait Pattern: Step-through pattern;Scissoring;Decreased dorsiflexion - left;Narrow base of support Gait velocity: Decreased General Gait Details: Pt reports this was the  first time OOB in days and ambulating Stairs: No Wheelchair Mobility Wheelchair Mobility: No    Exercises     PT Diagnosis:    PT Problem List:   PT Treatment Interventions:       PT Goals(Current goals can be found in the care plan section) Acute Rehab PT Goals PT Goal Formulation: No goals set, d/c therapy  Visit Information  Last PT Received On:  06/28/13 Assistance Needed: +1 History of Present Illness: 42 y/o male, with past medical history of HIV with last CD4 count 20 and a viral count of 1.8 and when recently diagnosed and started on Stribil, also past medical history of bipolar disorder with hallucinations started on risperidone 06/24/2013, he currently moved out of his place about a week ago and into his mother as he was having some problem fungal ( which he can not elaborate) with his a.c. at his infectious disease appointment the day of admission he related having intermittent fevers from 100 100.1 yesterday with chills decreased appetite nausea vomiting constipation he has been taking all of his medication except for his dapsone. He relates he has been feeling progressively weaker for the past week but no cough, shortness of breath, no cuts or sores in her mouth pain. Some SOB on exerion.       Prior Functioning  Home Living Family/patient expects to be discharged to:: Shelter/Homeless Prior Function Level of Independence: Independent with assistive device(s) Communication Communication: No difficulties    Cognition  Cognition Arousal/Alertness: Awake/alert Behavior During Therapy: WFL for tasks assessed/performed Overall Cognitive Status: Within Functional Limits for tasks assessed    Extremity/Trunk Assessment Lower Extremity Assessment Lower Extremity Assessment: Overall WFL for tasks assessed   Balance    End of Session PT - End of Session Activity Tolerance: Patient tolerated treatment well Patient left: in chair;with call bell/phone within reach  GP     Sol Blazing 06/28/2013, 12:32 PM

## 2013-06-28 NOTE — Progress Notes (Addendum)
06-28-13 MATCH ( medication assistance letter)  given to patient . Prescriptions also given to patient .  Co pay over ride applied .  Information on [facility] and [facility] also given to patient . Patient voiced understanding .  Ronny Flurry RN BSN [phone]    06-28-13 Gave patient list of shelters , he understands he has to call to arrange . Ronny Flurry RN BSN [phone]

## 2013-06-28 NOTE — Progress Notes (Signed)
[facility] PROGRESS NOTE  Ryan [name] WGN:[id number] DOB: December 26, 1970 DOA: 06/25/2013 PCP: Josue Hector, MD  HPI: Past medical history of HIV with last CD4 count 20 and a viral count of 1.8 and when recently diagnosed and started on Stribil, also past medical history of bipolar disorder with hallucinations started on risperidone 06/24/2013, he currently moved out of his place about a week ago and into his mother as he was having some problem fungal ( which he can not elaborate) with his a.c. at his infectious disease appointment the day of admission he related having intermittent fevers from 100 100.1 yesterday with chills decreased appetite nausea vomiting constipation he has been taking all of his medication except for his dapsone. He relates he has been feeling progressively weaker for the past week but no cough, shortness of breath, no cuts or sores in her mouth pain. Some SOB on exerion.  Assessment/Plan:  Pneumonia stable.  Discussed with Dr. Drue Second.  Atovaquone for 21 days total.  Pred 40 for 5 days, then 20 daily to complete 21 days.  Azithro and ceftin for CAP.  Was to be discharged to shelter, but Dr. Drue Second has arranged for short term [facility] until medically more stable, hopefully tomorrow  Bipolar disorder:  Denies hallucinations currently.  Cont current  Human immunodeficiency virus (HIV) disease  Cont current   Code Status: Full Family Communication: none today Disposition Plan: Engineer, manufacturing systems:  none  Procedures:  none  Anti-infectives   Start     Dose/Rate Route Frequency Ordered Stop   06/29/13 0000  azithromycin (ZITHROMAX) 250 MG tablet     250 mg Oral Daily 06/28/13 1027     06/28/13 0000  atovaquone (MEPRON) 750 MG/5ML suspension     750 mg Oral Every 12 hours 06/28/13 1022     06/28/13 0000  cefUROXime (CEFTIN) 500 MG tablet     500 mg Oral 2 times daily 06/28/13 1027     06/27/13 1800  azithromycin (ZITHROMAX)  tablet 500 mg     500 mg Oral Daily 06/27/13 1115     06/26/13 0800  elvitegravir-cobicistat-emtricitabine-tenofovir (STRIBILD) 150-150-200-300 MG tablet 1 tablet     1 tablet Oral Daily with breakfast 06/25/13 1555     06/26/13 0800  atovaquone (MEPRON) 750 MG/5ML suspension 750 mg     750 mg Oral 2 times daily with meals 06/25/13 1555     06/25/13 1730  azithromycin (ZITHROMAX) 500 mg in dextrose 5 % 250 mL IVPB  Status:  Discontinued     500 mg 250 mL/hr over 60 Minutes Intravenous Every 24 hours 06/25/13 1555 06/27/13 1115   06/25/13 1700  cefTRIAXone (ROCEPHIN) 1 g in dextrose 5 % 50 mL IVPB     1 g 100 mL/hr over 30 Minutes Intravenous Every 24 hours 06/25/13 1555     06/25/13 1600  atovaquone (MEPRON) 750 MG/5ML suspension 750 mg  Status:  Discontinued     750 mg Oral  Once 06/25/13 1513 06/25/13 1555     Antibiotics Given (last 72 hours)   Date/Time Action Medication Dose Rate   06/26/13 0910 Given   elvitegravir-cobicistat-emtricitabine-tenofovir (STRIBILD) 150-150-200-300 MG tablet 1 tablet 1 tablet    06/26/13 0910 Given   atovaquone (MEPRON) 750 MG/5ML suspension 750 mg 750 mg    06/26/13 1622 Given   cefTRIAXone (ROCEPHIN) 1 g in dextrose 5 % 50 mL IVPB 1 g 100 mL/hr   06/26/13 1741 Given   atovaquone (MEPRON) 750 MG/5ML  suspension 750 mg 750 mg    06/26/13 1741 Given   azithromycin (ZITHROMAX) 500 mg in dextrose 5 % 250 mL IVPB 500 mg 250 mL/hr   06/27/13 0935 Given   elvitegravir-cobicistat-emtricitabine-tenofovir (STRIBILD) 150-150-200-300 MG tablet 1 tablet 1 tablet    06/27/13 0935 Given   atovaquone (MEPRON) 750 MG/5ML suspension 750 mg 750 mg    06/27/13 1641 Given   cefTRIAXone (ROCEPHIN) 1 g in dextrose 5 % 50 mL IVPB 1 g 100 mL/hr   06/27/13 1811 Given   atovaquone (MEPRON) 750 MG/5ML suspension 750 mg 750 mg    06/27/13 2100 Given  [per pt request - med on time as scheduled]   azithromycin (ZITHROMAX) tablet 500 mg 500 mg    06/28/13 0849 Given    elvitegravir-cobicistat-emtricitabine-tenofovir (STRIBILD) 150-150-200-300 MG tablet 1 tablet 1 tablet    06/28/13 0849 Given   atovaquone (MEPRON) 750 MG/5ML suspension 750 mg 750 mg    06/28/13 0849 Given   azithromycin (ZITHROMAX) tablet 500 mg 500 mg    06/28/13 1819 Given   atovaquone (MEPRON) 750 MG/5ML suspension 750 mg 750 mg    06/28/13 1819 Given   cefTRIAXone (ROCEPHIN) 1 g in dextrose 5 % 50 mL IVPB 1 g 100 mL/hr      HPI/Subjective: No cough or dyspnea  Objective: Filed Vitals:   06/27/13 1510 06/27/13 2130 06/28/13 0538 06/28/13 1100  BP: 106/64 107/72 103/59   Pulse: 82 70 61   Temp: 97.4 F (36.3 C) 98.3 F (36.8 C) 98.6 F (37 C)   TempSrc: Oral Oral Oral   Resp: 18 18 18    Height:    5\' 9"  (1.753 m)  Weight:      SpO2: 98% 98% 99%     Intake/Output Summary (Last 24 hours) at 06/28/13 2010 Last data filed at 06/28/13 1646  Gross per 24 hour  Intake    160 ml  Output    675 ml  Net   -515 ml   Filed Weights   06/26/13 0500 06/26/13 1340  Weight: 64.4 kg (141 lb 15.6 oz) 65.046 kg (143 lb 6.4 oz)    Exam:   General:  NAD  Cardiovascular: regular rate and rhythm, without MRG  Respiratory: good air movement, clear to auscultation   Abdomen: soft, not tender to palpation, positive bowel sounds  MSK: no peripheral edema  Psych: cooperative. Does not appear to be hallucinating  Data Reviewed: Basic Metabolic Panel:  Recent Labs Lab 06/25/13 1407 06/26/13 0547  NA 134* 139  K 4.0 4.2  CL 100 108  CO2 24 20  GLUCOSE 90 132*  BUN 11 11  CREATININE 1.01 0.75  CALCIUM 8.7 8.8   Liver Function Tests:  Recent Labs Lab 06/25/13 1407 06/26/13 0547  AST 15 13  ALT 15 12  ALKPHOS 70 66  BILITOT 0.3 0.3  PROT 6.7 6.6  ALBUMIN 2.6* 2.5*   CBC:  Recent Labs Lab 06/25/13 1407 06/26/13 0547  WBC 8.5 6.6  NEUTROABS 6.6  --   HGB 9.3* 9.4*  HCT 27.6* 27.9*  MCV 89.3 89.4  PLT 202 199   Studies: No results found.  Scheduled  Meds: . atovaquone  750 mg Oral BID WC  . azithromycin  500 mg Oral Daily  . cefTRIAXone (ROCEPHIN)  IV  1 g Intravenous Q24H  . elvitegravir-cobicistat-emtricitabine-tenofovir  1 tablet Oral Q breakfast  . feeding supplement  1 Container Oral TID BM  . heparin  5,000 Units Subcutaneous Q8H  .  pantoprazole  40 mg Oral Daily  . predniSONE  50 mg Oral BID WC  . risperiDONE  1 mg Oral Daily  . risperiDONE  2 mg Oral QHS   Continuous Infusions:  Time spent: 38  Crista Curb, MD [facility] Pager [phone]. If 7 PM - 7 AM, please contact night-coverage at [email or web address], password [facility] 06/28/2013, 8:10 PM  LOS: 3 days

## 2013-06-28 NOTE — Telephone Encounter (Signed)
Patient's Mom called with concerns regarding her son, who is currently a patient at [facility]. She was unsure if she should go to Social Services this morning to bring paperwork that needs to be submitted. I advised her that she should as that would speed up the process regarding his eligibility for Medicaid. She is also requesting that Dr. Drue Second call her because she is adamant that he can not return to her home. Will speak to [facility] regarding any help they can provide in the way of housing, etc. She is requesting that Dr. Drue Second call her at [phone]. She also stated she does not have an answering machine and may be outside; I advised her she should be available for a call back. Wendall Mola

## 2013-06-29 LAB — LEGIONELLA ANTIGEN, URINE

## 2013-06-29 MED ORDER — DIAZEPAM 2 MG PO TABS: 2.0000 mg | ORAL_TABLET | Freq: Two times a day (BID) | ORAL | Status: DC | PRN

## 2013-06-29 MED ORDER — RISPERIDONE 1 MG PO TBDP: ORAL_TABLET | ORAL | Status: DC

## 2013-06-29 NOTE — Progress Notes (Signed)
Patient given discharge instructions and follow-up information.  Pt verbalized understanding of all instructions.  Ronny Flurry, CM had already given pt his [facility] letter for med assistance.  Dr Lendell Caprice gave him his prescriptions.  Patient stated that he called all of the shelters on the list and none of them had availability.  He stated that there was a place in [place] that he could go to.  RN overheard him talking and verifying that on the phone with them.  Pt's nephew here to pick him up for discharge.  No other questions or concerns at this time.  Patient refused wheelchair and walked out.

## 2013-06-29 NOTE — Discharge Summary (Signed)
Physician Discharge Summary  Ryan [name]:[id number] DOB: [birth year] DOA: 06/25/2013  PCP: Josue Hector, MD  Admit date: 06/25/2013 Discharge date: 06/29/2013  Time spent: greater than 30 minutes  Discharge Diagnoses:  PNeumonia: community acquired v. PCP   Bipolar disorder   GERD (gastroesophageal reflux disease)   Human immunodeficiency virus (HIV) disease   Homelessness   Weakness generalized   Abnormal TSH, needs outpatient retest after resolution of pneumonia   Anemia  Discharge Condition: stable  Filed Weights   06/26/13 0500 06/26/13 1340 06/29/13 0610  Weight: 64.4 kg (141 lb 15.6 oz) 65.046 kg (143 lb 6.4 oz) 66.3 kg (146 lb 2.6 oz)    History of present illness and hospital course Ryan [name] is a 42 y.o. male  Past medical history of HIV with last CD4 count 20 and a viral count of 1.8 and when recently diagnosed and started on Stribil, also past medical history of bipolar disorder with hallucinations started on risperidone 06/24/2013, he currently moved out of his place about a week ago and into his mother as he was having some problem fungal ( which he can not elaborate) with his a.c. at his infectious disease appointment the day of admission he related having intermittent fevers from 100 100.1 yesterday with chills decreased appetite nausea vomiting constipation he has been taking all of his medication except for his dapsone. He relates he has been feeling progressively weaker for the past week but no cough, shortness of breath, no cuts or sores in her mouth pain. Some SOB on exerion.  CXR showed bilateral patchy infiltrate.  Patient started on rocephin and azithromycin for CAP coverage.  Atovaquone and predinsone to cover PCP (sulfa allergic) .  Fever resolved.  No cough by the time of discharge.  Paych consulted for bipolar disorder and recommended increasing Risperdal to 1 mg qam, 2 mg qhs.  Did not meet criteria for inpatient psych transfer.  Not  currently hallucinating.  Patient had been staying with mother, who will no longer allow him to live with her.  Social work consulted.    Discharge Exam: Filed Vitals:   06/29/13 0610  BP: 107/75  Pulse: 75  Temp: 97.4 F (36.3 C)  Resp: 18    General: calm, cooperative, comfortable, alert and oriented Cardiovascular: RRR Respiratory: CTA without WRR  Discharge Instructions  Discharge Orders   Future Orders Complete By Expires   Diet general  As directed    Discharge patient  As directed        Medication List         acetaminophen 325 MG tablet  Commonly known as:  TYLENOL  Take 2 tablets (650 mg total) by mouth every 6 (six) hours as needed.     atovaquone 750 MG/5ML suspension  Commonly known as:  MEPRON  Take 5 mLs (750 mg total) by mouth every 12 (twelve) hours. Until 07/19/13 then stop     azithromycin 600 MG tablet  Commonly known as:  ZITHROMAX  Take 2 tablets (1,200 mg total) by mouth every 7 (seven) days.     azithromycin 250 MG tablet  Commonly known as:  ZITHROMAX  Take 1 tablet (250 mg total) by mouth daily.     cefUROXime 500 MG tablet  Commonly known as:  CEFTIN  Take 1 tablet (500 mg total) by mouth 2 (two) times daily.     DEXILANT 60 MG capsule  Generic drug:  dexlansoprazole  Take 60 mg by mouth at bedtime.  diazepam 2 MG tablet  Commonly known as:  VALIUM  Take 1 tablet (2 mg total) by mouth every 12 (twelve) hours as needed for anxiety.     elvitegravir-cobicistat-emtricitabine-tenofovir 150-150-200-300 MG Tabs tablet  Commonly known as:  STRIBILD  Take 1 tablet by mouth daily with breakfast.     predniSONE 20 MG tablet  Commonly known as:  DELTASONE  Take 2 tablets by mouth daily for 5 days, then 1 tablet by mouth daily until gone     risperiDONE 1 MG disintegrating tablet  Commonly known as:  RISPERDAL M-TABS  1 tablet by mouth each morning 2 tablets each evening       Allergies  Allergen Reactions  . Levaquin  [Levofloxacin In D5w] Other (See Comments)    Pain & nerve damage in fingers  . Sulfa Antibiotics Rash       Follow-up Information   Follow up with Judyann Munson, MD.   Specialty:  Infectious Diseases   Contact information:   [address] [place] [address] [phone]        The results of significant diagnostics from this hospitalization (including imaging, microbiology, ancillary and laboratory) are listed below for reference.    Significant Diagnostic Studies: Dg Chest 2 View  06/25/2013   *RADIOLOGY REPORT*  Clinical Data: Tachycardia, history of HIV, hypertension, substance abuse, initial encounter.  CHEST - 2 VIEW  Comparison: 05/20/2013; 01/04/2012  Findings:  Grossly unchanged cardiac silhouette and mediastinal contours. There are ill-defined heterogeneous interstitial predominant opacities within the bilateral lungs.  These opacities appear to demonstrate a perihilar and slight upper lobe predominance.  No pleural effusion or pneumothorax.  No acute osseous abnormality.  IMPRESSION:  Bilateral perihilar and upper lung predominant interstitial opacities are nonspecific though given history of immunodeficiency are worrisome for multifocal atypical infection.  A follow-up chest radiograph in 4 to 6 weeks after treatment is recommended to ensure resolution.   Original Report Authenticated By: Tacey Ruiz, MD    Microbiology: Recent Results (from the past 240 hour(s))  CULTURE, BLOOD (ROUTINE X 2)     Status: None   Collection Time    06/25/13  3:09 PM      Result Value Range Status   Specimen Description BLOOD ARM RIGHT   Final   Special Requests BOTTLES DRAWN AEROBIC AND ANAEROBIC 10CC   Final   Culture  Setup Time     Final   Value: 06/25/2013 21:40     Performed at [facility]   Culture     Final   Value:        BLOOD CULTURE RECEIVED NO GROWTH TO DATE CULTURE WILL BE HELD FOR 5 DAYS BEFORE ISSUING A FINAL NEGATIVE REPORT     Performed at  [facility]   Report Status PENDING   Incomplete  CULTURE, BLOOD (ROUTINE X 2)     Status: None   Collection Time    06/25/13  3:09 PM      Result Value Range Status   Specimen Description BLOOD FOREARM RIGHT   Final   Special Requests BOTTLES DRAWN AEROBIC AND ANAEROBIC 5CC   Final   Culture  Setup Time     Final   Value: 06/25/2013 21:39     Performed at [facility]   Culture     Final   Value:        BLOOD CULTURE RECEIVED NO GROWTH TO DATE CULTURE WILL BE HELD FOR 5 DAYS  BEFORE ISSUING A FINAL NEGATIVE REPORT     Performed at [facility]   Report Status PENDING   Incomplete  URINE CULTURE     Status: None   Collection Time    06/25/13  4:32 PM      Result Value Range Status   Specimen Description URINE, CLEAN CATCH   Final   Special Requests Immunocompromised   Final   Culture  Setup Time     Final   Value: 06/26/2013 01:37     Performed at [facility] Count     Final   Value: NO GROWTH     Performed at [facility]   Culture     Final   Value: NO GROWTH     Performed at [facility]   Report Status 06/27/2013 FINAL   Final     Labs: Basic Metabolic Panel:  Recent Labs Lab 06/25/13 1407 06/26/13 0547  NA 134* 139  K 4.0 4.2  CL 100 108  CO2 24 20  GLUCOSE 90 132*  BUN 11 11  CREATININE 1.01 0.75  CALCIUM 8.7 8.8   Liver Function Tests:  Recent Labs Lab 06/25/13 1407 06/26/13 0547  AST 15 13  ALT 15 12  ALKPHOS 70 66  BILITOT 0.3 0.3  PROT 6.7 6.6  ALBUMIN 2.6* 2.5*   No results found for this basename: LIPASE, AMYLASE,  in the last 168 hours No results found for this basename: AMMONIA,  in the last 168 hours CBC:  Recent Labs Lab 06/25/13 1407 06/26/13 0547  WBC 8.5 6.6  NEUTROABS 6.6  --   HGB 9.3* 9.4*  HCT 27.6* 27.9*  MCV 89.3 89.4  PLT 202 199   Cardiac Enzymes: No results found for this basename: CKTOTAL, CKMB, CKMBINDEX, TROPONINI,  in the last 168 hours BNP: BNP  (last 3 results) No results found for this basename: PROBNP,  in the last 8760 hours CBG: No results found for this basename: GLUCAP,  in the last 168 hours Signed:  Tehani Mersman L  [facility] 06/29/2013, 2:01 PM

## 2013-06-30 ENCOUNTER — Telehealth: Payer: Self-pay | Admitting: *Deleted

## 2013-06-30 ENCOUNTER — Other Ambulatory Visit: Payer: Self-pay | Admitting: *Deleted

## 2013-06-30 DIAGNOSIS — B20 Human immunodeficiency virus [HIV] disease: Secondary | ICD-10-CM

## 2013-06-30 MED ORDER — AZITHROMYCIN 600 MG PO TABS: 1200.0000 mg | ORAL_TABLET | ORAL | Status: DC

## 2013-06-30 MED ORDER — ELVITEG-COBIC-EMTRICIT-TENOFDF 150-150-200-300 MG PO TABS: 1.0000 | ORAL_TABLET | Freq: Every day | ORAL | Status: DC

## 2013-06-30 NOTE — Telephone Encounter (Signed)
Discharged from [facility], wanting to know when to come back to see Dr. Drue Second.  Needs [facility] appt. per Dr. Feliz Beam recommendation

## 2013-07-01 LAB — CULTURE, BLOOD (ROUTINE X 2)

## 2013-07-07 ENCOUNTER — Ambulatory Visit (INDEPENDENT_AMBULATORY_CARE_PROVIDER_SITE_OTHER): Payer: Medicaid Other | Admitting: Internal Medicine

## 2013-07-07 ENCOUNTER — Encounter: Payer: Self-pay | Admitting: Internal Medicine

## 2013-07-07 VITALS — BP 130/80 | HR 120 | Temp 97.8°F | Wt 157.0 lb

## 2013-07-07 DIAGNOSIS — F319 Bipolar disorder, unspecified: Secondary | ICD-10-CM

## 2013-07-07 DIAGNOSIS — B20 Human immunodeficiency virus [HIV] disease: Secondary | ICD-10-CM

## 2013-07-07 MED ORDER — ATOVAQUONE 750 MG/5ML PO SUSP: 750.0000 mg | Freq: Two times a day (BID) | ORAL | Status: DC

## 2013-07-07 MED ORDER — RISPERIDONE 1 MG PO TBDP: ORAL_TABLET | ORAL | Status: DC

## 2013-07-07 MED ORDER — DIAZEPAM 2 MG PO TABS: 2.0000 mg | ORAL_TABLET | Freq: Two times a day (BID) | ORAL | Status: DC | PRN

## 2013-07-07 NOTE — Progress Notes (Signed)
[facility] NOTE  RFV: routine Subjective:    Patient ID: Ryan [name], male    DOB: Jan 21, 1971, 42 y.o.   MRN: [id number]  HPI 42yo with HIV/AIDS, recently diagnosed with HIV, CD 4 count of 20/VL 1.25M, has been doing well since discharge from hospital. He is now on stribild x 2-3 wks, finishing presumed pcp/CAP treatment. Bipolar appears to be sufficiently managed with risperidone 1mg  am and 2mg  in pm, plus valium QD-BID prn. He needs atovaquone prescription. He doesn't not have coverage for his psych medications.  Current Outpatient Prescriptions on File Prior to Visit  Medication Sig Dispense Refill  . acetaminophen (TYLENOL) 325 MG tablet Take 2 tablets (650 mg total) by mouth every 6 (six) hours as needed.      Marland Kitchen atovaquone (MEPRON) 750 MG/5ML suspension Take 5 mLs (750 mg total) by mouth every 12 (twelve) hours. Until 07/19/13 then stop  210 mL  0  . azithromycin (ZITHROMAX) 250 MG tablet Take 1 tablet (250 mg total) by mouth daily.  6 each  0  . azithromycin (ZITHROMAX) 600 MG tablet Take 2 tablets (1,200 mg total) by mouth every 7 (seven) days.  8 tablet  5  . cefUROXime (CEFTIN) 500 MG tablet Take 1 tablet (500 mg total) by mouth 2 (two) times daily.  8 tablet  0  . dexlansoprazole (DEXILANT) 60 MG capsule Take 60 mg by mouth at bedtime.       . diazepam (VALIUM) 2 MG tablet Take 1 tablet (2 mg total) by mouth every 12 (twelve) hours as needed for anxiety.  10 tablet  0  . elvitegravir-cobicistat-emtricitabine-tenofovir (STRIBILD) 150-150-200-300 MG TABS tablet Take 1 tablet by mouth daily with breakfast.  30 tablet  11  . predniSONE (DELTASONE) 20 MG tablet Take 2 tablets by mouth daily for 5 days, then 1 tablet by mouth daily until gone  25 tablet  0  . risperiDONE (RISPERDAL M-TABS) 1 MG disintegrating tablet 1 tablet by mouth each morning 2 tablets each evening  90 tablet  0   No current facility-administered medications on file prior to visit.   Active Ambulatory Problems     Diagnosis Date Noted  . Hypotension 01/03/2012  . ARF (acute renal failure) 01/03/2012  . Rash 01/03/2012  . HTN (hypertension) 01/03/2012  . Orthostatic hypotension 05/20/2013  . Chest pain on exertion 05/20/2013  . Weight loss 05/20/2013  . Bipolar disorder 05/20/2013  . GERD (gastroesophageal reflux disease) 05/20/2013  . Malnutrition of moderate degree 05/21/2013  . H/O: substance abuse 06/09/2013  . Anxiety 06/10/2013  . Human immunodeficiency virus (HIV) disease 05/21/2013  . Fever and chills 06/25/2013  . PNA (pneumonia) 06/25/2013  . Homelessness 06/27/2013  . Weakness generalized 06/27/2013  . Abnormal TSH 06/27/2013  . Anemia 06/27/2013   Resolved Ambulatory Problems    Diagnosis Date Noted  . No Resolved Ambulatory Problems   Past Medical History  Diagnosis Date  . Hypertension   . Depressed   . HIV (human immunodeficiency virus infection)         Review of Systems 12 point ROS reviewed, no positive pertinents except poor sleep    Objective:   Physical Exam BP 130/80  Pulse 120  Temp(Src) 97.8 F (36.6 C) (Oral)  Wt 157 lb (71.215 kg)  BMI 23.17 kg/m2 Physical Exam  Constitutional: He is oriented to person, place, and time. He appears well-developed and well-nourished. No distress.  HENT:  Mouth/Throat: Oropharynx is clear and moist. No oropharyngeal exudate.  Cardiovascular: Normal rate, regular rhythm and normal heart sounds. Exam reveals no gallop and no friction rub.  No murmur heard.  Pulmonary/Chest: Effort normal and breath sounds normal. No respiratory distress. He has no wheezes.  Abdominal: Soft. Bowel sounds are normal. He exhibits no distension. There is no tenderness.  Lymphadenopathy:  He has no cervical adenopathy.  Neurological: He is alert and oriented to person, place, and time.  Skin: Skin is warm and dry. No rash noted. No erythema.  Psychiatric: fluent for the first time         Assessment & Plan:  Psych = need to  get PAP for respiradone. Give refill for valium. Will need to follow up with family services for psych access  hiv = continue on stribild  oi proph = continue on mepron and azithromycin  pcp pneumonia = finish out pred taper, then continue on mepron  Housing = see if he is eligible for housing via [facility]

## 2013-07-10 ENCOUNTER — Encounter: Payer: Self-pay | Admitting: Hematology and Oncology

## 2013-07-12 ENCOUNTER — Telehealth: Payer: Self-pay | Admitting: *Deleted

## 2013-07-12 NOTE — Telephone Encounter (Signed)
Pt called to let us know that he had a seizure over the weekend - Saturday afternoon. Pt states that he was walking with friends when he fell and started kicking and shaking.  Pt states he didn't remember what happened for about 15 minutes, but then did remember the event.  Pt states he did not take any new medications/drugs or alcohol.  Pt did not call 911 or go to the ED.  Pt has not had any additional seizures since then.  RN advised him that he/his friends should call 911 after a seizure, pt should let his PCP know as well.Marland Kitchen Andree Coss, RN

## 2013-07-21 ENCOUNTER — Ambulatory Visit (INDEPENDENT_AMBULATORY_CARE_PROVIDER_SITE_OTHER): Payer: Medicaid Other | Admitting: Internal Medicine

## 2013-07-21 ENCOUNTER — Encounter: Payer: Self-pay | Admitting: Internal Medicine

## 2013-07-21 VITALS — BP 132/78 | HR 101 | Temp 98.7°F | Wt 164.0 lb

## 2013-07-21 DIAGNOSIS — F319 Bipolar disorder, unspecified: Secondary | ICD-10-CM

## 2013-07-21 DIAGNOSIS — B20 Human immunodeficiency virus [HIV] disease: Secondary | ICD-10-CM

## 2013-07-21 MED ORDER — RISPERIDONE 1 MG PO TBDP: ORAL_TABLET | ORAL | Status: DC

## 2013-07-21 MED ORDER — AZITHROMYCIN 600 MG PO TABS: 1200.0000 mg | ORAL_TABLET | ORAL | Status: DC

## 2013-07-21 MED ORDER — DIAZEPAM 2 MG PO TABS: 2.0000 mg | ORAL_TABLET | Freq: Two times a day (BID) | ORAL | Status: DC | PRN

## 2013-07-22 NOTE — Progress Notes (Signed)
Subjective:    Patient ID: Ryan [name], male    DOB: [birth year], 42 y.o.   MRN: [id number]  HPI 42yo M with HIV and significant bipolar disease, CD 4 count of 20/VL1.59M on stribild now for nearly a month. Doing well. Continues to take risperidone 1mg  in the am and 2mg  in the pm plus valium in the am. He is taking his medications as directed with exception to slight error on azithromycin which he was taking daily. He reports good appetite and some weight gain since last visit  Current Outpatient Prescriptions on File Prior to Visit  Medication Sig Dispense Refill  . acetaminophen (TYLENOL) 325 MG tablet Take 2 tablets (650 mg total) by mouth every 6 (six) hours as needed.      Marland Kitchen atovaquone (MEPRON) 750 MG/59ML suspension Take 5 mLs (750 mg total) by mouth every 12 (twelve) hours.  210 mL  11  . cefUROXime (CEFTIN) 500 MG tablet Take 1 tablet (500 mg total) by mouth 2 (two) times daily.  8 tablet  0  . dexlansoprazole (DEXILANT) 60 MG capsule Take 60 mg by mouth at bedtime.       . elvitegravir-cobicistat-emtricitabine-tenofovir (STRIBILD) 150-150-200-300 MG TABS tablet Take 1 tablet by mouth daily with breakfast.  30 tablet  11   No current facility-administered medications on file prior to visit.   Active Ambulatory Problems    Diagnosis Date Noted  . Hypotension 01/03/2012  . ARF (acute renal failure) 01/03/2012  . Rash 01/03/2012  . HTN (hypertension) 01/03/2012  . Orthostatic hypotension 05/20/2013  . Chest pain on exertion 05/20/2013  . Weight loss 05/20/2013  . Bipolar disorder 05/20/2013  . GERD (gastroesophageal reflux disease) 05/20/2013  . Malnutrition of moderate degree 05/21/2013  . H/O: substance abuse 06/09/2013  . Anxiety 06/10/2013  . Human immunodeficiency virus (HIV) disease 05/21/2013  . Fever and chills 06/25/2013  . PNA (pneumonia) 06/25/2013  . Homelessness 06/27/2013  . Weakness generalized 06/27/2013  . Abnormal TSH 06/27/2013  . Anemia 06/27/2013    Resolved Ambulatory Problems    Diagnosis Date Noted  . No Resolved Ambulatory Problems   Past Medical History  Diagnosis Date  . Hypertension   . Depressed   . HIV (human immunodeficiency virus infection)    History  Substance Use Topics  . Smoking status: Never Smoker   . Smokeless tobacco: Not on file  . Alcohol Use: No  - remains unemployed still living in transitional housing   Review of Systems Review of Systems  Constitutional: Negative for fever, chills, diaphoresis, activity change, appetite change, fatigue and unexpected weight change.  HENT: Negative for congestion, sore throat, rhinorrhea, sneezing, trouble swallowing and sinus pressure.  Eyes: Negative for photophobia and visual disturbance.  Respiratory: Negative for cough, chest tightness, shortness of breath, wheezing and stridor.  Cardiovascular: Negative for chest pain, palpitations and leg swelling.  Gastrointestinal: Negative for nausea, vomiting, abdominal pain, diarrhea, constipation, blood in stool, abdominal distention and anal bleeding.  Genitourinary: Negative for dysuria, hematuria, flank pain and difficulty urinating.  Musculoskeletal: Negative for myalgias, back pain, joint swelling, arthralgias and gait problem.  Skin: Negative for color change, pallor, rash and wound.  Neurological: Negative for dizziness, tremors, weakness and light-headedness.  Hematological: Negative for adenopathy. Does not bruise/bleed easily.  Psychiatric/Behavioral: Negative for behavioral problems, confusion, sleep disturbance, dysphoric mood, decreased concentration and agitation.       Objective:   Physical Exam BP 132/78  Pulse 101  Temp(Src) 98.7 F (37.1 C) (Oral)  Wt 164 lb (74.39 kg)  BMI 24.21 kg/m2 Physical Exam  Constitutional: He is oriented to person, place, and time. He appears well-developed and well-nourished. No distress.  HENT:  Mouth/Throat: Oropharynx is clear and moist. No oropharyngeal  exudate.  Cardiovascular: Normal rate, regular rhythm and normal heart sounds. Exam reveals no gallop and no friction rub.  No murmur heard.  Pulmonary/Chest: Effort normal and breath sounds normal. No respiratory distress. He has no wheezes.  Abdominal: Soft. Bowel sounds are normal. He exhibits no distension. There is no tenderness.  Lymphadenopathy:  He has no cervical adenopathy.  Neurological: He is alert and oriented to person, place, and time.  Skin: Skin is warm and dry. No rash noted. No erythema.  Psychiatric: He has a normal mood and affect. His behavior is normal.        Assessment & Plan:  HIV = continue on stribild, we will check his viral load at next visit in 4 wks  Bipolar disease =will give him refills on risperidone and valium. Recommend he follows up  rtc in 4 wks

## 2013-07-23 ENCOUNTER — Telehealth: Payer: Self-pay | Admitting: *Deleted

## 2013-07-23 LAB — AFB CULTURE, BLOOD

## 2013-07-23 NOTE — Telephone Encounter (Signed)
Patient left voice mail regarding applying for disability and the necessary forms. Returned his call and reached his voice mail. Left a message stating he should contact his PCP regarding this and to call the clinic back with for further assistance. Ryan [name]

## 2013-07-27 ENCOUNTER — Telehealth: Payer: Self-pay | Admitting: *Deleted

## 2013-07-27 NOTE — Telephone Encounter (Signed)
Mr. Burdin has been approved by Oletta Darter for is Risperdal through 07-17-13.

## 2013-08-12 ENCOUNTER — Other Ambulatory Visit: Payer: Self-pay

## 2013-08-19 ENCOUNTER — Ambulatory Visit (INDEPENDENT_AMBULATORY_CARE_PROVIDER_SITE_OTHER): Payer: Medicaid Other | Admitting: Internal Medicine

## 2013-08-19 ENCOUNTER — Encounter: Payer: Self-pay | Admitting: Internal Medicine

## 2013-08-19 VITALS — BP 121/85 | HR 99 | Temp 98.3°F | Wt 167.0 lb

## 2013-08-19 DIAGNOSIS — Z23 Encounter for immunization: Secondary | ICD-10-CM

## 2013-08-19 DIAGNOSIS — F319 Bipolar disorder, unspecified: Secondary | ICD-10-CM

## 2013-08-19 DIAGNOSIS — B20 Human immunodeficiency virus [HIV] disease: Secondary | ICD-10-CM

## 2013-08-19 LAB — COMPREHENSIVE METABOLIC PANEL WITH GFR
ALT: 12 U/L (ref 0–53)
AST: 13 U/L (ref 0–37)
Albumin: 3.8 g/dL (ref 3.5–5.2)
Alkaline Phosphatase: 70 U/L (ref 39–117)
BUN: 12 mg/dL (ref 6–23)
CO2: 29 meq/L (ref 19–32)
Calcium: 9.5 mg/dL (ref 8.4–10.5)
Chloride: 100 meq/L (ref 96–112)
Creat: 1.05 mg/dL (ref 0.50–1.35)
Glucose, Bld: 77 mg/dL (ref 70–99)
Potassium: 4.2 meq/L (ref 3.5–5.3)
Sodium: 138 meq/L (ref 135–145)
Total Bilirubin: 0.3 mg/dL (ref 0.3–1.2)
Total Protein: 7.6 g/dL (ref 6.0–8.3)

## 2013-08-19 LAB — CBC WITH DIFFERENTIAL/PLATELET
Basophils Relative: 1 % (ref 0–1)
HCT: 39.4 % (ref 39.0–52.0)
Hemoglobin: 13.6 g/dL (ref 13.0–17.0)
Lymphocytes Relative: 26 % (ref 12–46)
Lymphs Abs: 2.3 10*3/uL (ref 0.7–4.0)
MCH: 31.6 pg (ref 26.0–34.0)
MCHC: 34.5 g/dL (ref 30.0–36.0)
MCV: 91.6 fL (ref 78.0–100.0)
Monocytes Absolute: 0.9 10*3/uL (ref 0.1–1.0)
Monocytes Relative: 10 % (ref 3–12)
Neutrophils Relative %: 61 % (ref 43–77)
Platelets: 315 10*3/uL (ref 150–400)
RBC: 4.3 MIL/uL (ref 4.22–5.81)
WBC: 8.9 10*3/uL (ref 4.0–10.5)

## 2013-08-19 MED ORDER — DIAZEPAM 2 MG PO TABS: 2.0000 mg | ORAL_TABLET | Freq: Two times a day (BID) | ORAL | Status: DC | PRN

## 2013-08-19 NOTE — Progress Notes (Signed)
[facility] NOTE  RFV: routine  Subjective:    Patient ID: Ryan [name], male    DOB: 1971-07-19, 42 y.o.   MRN: [id number]  HPI  42yo M with HIV and significant bipolar disease, CD 4 count of 20/VL1.62M on stribild now for nearly a month. Doing well. Continues to take risperidone 1mg  in the am and 2mg  in the pm plus valium in the am. He is taking his medications as directed with exception to slight error on azithromycin which he was taking daily. He reports good appetite and some weight gain since last visit.  Now has patient assistance for risperidone to afford his medicaiton. Has not followed up with counselor or psychiatry service.  Current Outpatient Prescriptions on File Prior to Visit  Medication Sig Dispense Refill  . acetaminophen (TYLENOL) 325 MG tablet Take 2 tablets (650 mg total) by mouth every 6 (six) hours as needed.      Marland Kitchen atovaquone (MEPRON) 750 MG/62ML suspension Take 5 mLs (750 mg total) by mouth every 12 (twelve) hours.  210 mL  11  . azithromycin (ZITHROMAX) 600 MG tablet Take 2 tablets (1,200 mg total) by mouth every 7 (seven) days.  8 tablet  3  . cefUROXime (CEFTIN) 500 MG tablet Take 1 tablet (500 mg total) by mouth 2 (two) times daily.  8 tablet  0  . dexlansoprazole (DEXILANT) 60 MG capsule Take 60 mg by mouth at bedtime.       . diazepam (VALIUM) 2 MG tablet Take 1 tablet (2 mg total) by mouth every 12 (twelve) hours as needed for anxiety.  30 tablet  0  . elvitegravir-cobicistat-emtricitabine-tenofovir (STRIBILD) 150-150-200-300 MG TABS tablet Take 1 tablet by mouth daily with breakfast.  30 tablet  11  . risperiDONE (RISPERDAL M-TABS) 1 MG disintegrating tablet 1 tablet by mouth each morning 2 tablets each evening  90 tablet  11   No current facility-administered medications on file prior to visit.   Active Ambulatory Problems    Diagnosis Date Noted  . Hypotension 01/03/2012  . ARF (acute renal failure) 01/03/2012  . Rash 01/03/2012  . HTN  (hypertension) 01/03/2012  . Orthostatic hypotension 05/20/2013  . Chest pain on exertion 05/20/2013  . Weight loss 05/20/2013  . Bipolar disorder 05/20/2013  . GERD (gastroesophageal reflux disease) 05/20/2013  . Malnutrition of moderate degree 05/21/2013  . H/O: substance abuse 06/09/2013  . Anxiety 06/10/2013  . Human immunodeficiency virus (HIV) disease 05/21/2013  . Fever and chills 06/25/2013  . PNA (pneumonia) 06/25/2013  . Homelessness 06/27/2013  . Weakness generalized 06/27/2013  . Abnormal TSH 06/27/2013  . Anemia 06/27/2013   Resolved Ambulatory Problems    Diagnosis Date Noted  . No Resolved Ambulatory Problems   Past Medical History  Diagnosis Date  . Hypertension   . Depressed   . HIV (human immunodeficiency virus infection)    Soc hx: living in run down home that he is renting. 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   Review of Systems  Constitutional: Negative for fever, chills, diaphoresis, activity change, appetite change, fatigue and unexpected weight change.  HENT: Negative for congestion, sore throat, rhinorrhea, sneezing, trouble swallowing and sinus pressure.  Eyes: Negative for photophobia and visual disturbance.  Respiratory: Negative for cough, chest tightness, shortness of breath, wheezing and stridor.  Cardiovascular: Negative for chest pain, palpitations and leg swelling.  Gastrointestinal: Negative for nausea, vomiting, abdominal pain, diarrhea, constipation, blood in stool, abdominal distention and anal bleeding.  Genitourinary: Negative for dysuria, hematuria, flank pain and difficulty urinating.  Musculoskeletal: Negative for myalgias, back pain, joint swelling, arthralgias and gait problem.  Skin: Negative for color change, pallor, rash and wound.  Neurological: Negative for dizziness, tremors, weakness and light-headedness.  Hematological: Negative for adenopathy. Does not bruise/bleed easily.  Psychiatric/Behavioral:  Negative for behavioral problems, confusion, sleep disturbance, dysphoric mood, decreased concentration and agitation.       Objective:   Physical Exam BP 121/85  Pulse 99  Temp(Src) 98.3 F (36.8 C) (Oral)  Wt 167 lb (75.751 kg) Physical Exam  Constitutional: He is oriented to person, place, and time. He appears well-developed and well-nourished. No distress.  HENT:  Mouth/Throat: Oropharynx is clear and moist. No oropharyngeal exudate.  Cardiovascular: Normal rate, regular rhythm and normal heart sounds. Exam reveals no gallop and no friction rub.  No murmur heard.  Pulmonary/Chest: Effort normal and breath sounds normal. No respiratory distress. He has no wheezes.  Abdominal: Soft. Bowel sounds are normal. He exhibits no distension. There is no tenderness.  Lymphadenopathy:  He has no cervical adenopathy.  Neurological: He is alert and oriented to person, place, and time.  Skin: Skin is warm and dry. No rash noted. No erythema.  Psychiatric: sedated.      Assessment & Plan:  hiv =get labs to see how he is doing with initiation of ART in the last 1-2 months  Bipolar = meds now covered, but needs counseling. Valium refill #30 x 1 with NR, needs to have psychiatry take over rx.  Health maintenance = will give pneumonia and flu vax today  rtc in 4 wks

## 2013-08-20 LAB — T-HELPER CELL (CD4) - (RCID CLINIC ONLY): CD4 T Cell Abs: 330 /uL — ABNORMAL LOW (ref 400–2700)

## 2013-08-22 LAB — HIV-1 RNA QUANT-NO REFLEX-BLD: HIV 1 RNA Quant: 35823 copies/mL — ABNORMAL HIGH (ref <20)

## 2013-09-20 ENCOUNTER — Other Ambulatory Visit: Payer: Self-pay | Admitting: Internal Medicine

## 2013-09-20 ENCOUNTER — Encounter: Payer: Self-pay | Admitting: Internal Medicine

## 2013-09-20 ENCOUNTER — Ambulatory Visit (INDEPENDENT_AMBULATORY_CARE_PROVIDER_SITE_OTHER): Payer: Medicaid Other | Admitting: Internal Medicine

## 2013-09-20 VITALS — BP 135/85 | HR 116 | Temp 98.2°F | Wt 167.0 lb

## 2013-09-20 DIAGNOSIS — B20 Human immunodeficiency virus [HIV] disease: Secondary | ICD-10-CM

## 2013-09-20 DIAGNOSIS — F319 Bipolar disorder, unspecified: Secondary | ICD-10-CM

## 2013-09-20 MED ORDER — DIAZEPAM 2 MG PO TABS: 2.0000 mg | ORAL_TABLET | Freq: Two times a day (BID) | ORAL | Status: DC | PRN

## 2013-09-20 NOTE — Progress Notes (Signed)
Subjective:    Patient ID: Ryan [name], male    DOB: 10-24-1970, 42 y.o.   MRN: [id number]  HPI 42yo M with HIV and significant bipolar disease, CD 4 count of 330/VL 35,823 ( oct 2014)up from  20/VL1.56M after 1 month of stribild. Doing well. He is supposed to take risperidone 1mg  in the am and 2mg  in the pm plus valium in the am but has decreased his risperidone to 2mg  daily and ran out of valimum. He continues to take mepron and azithromycin as directed. He reports good appetite and some weight gain since last visit.  He no longer has a job and is worried about finances. He has not slept well in 4 days. Does not have heat and little wood to keep heat in his home  Now has patient assistance for risperidone to afford his medicaiton. Has not followed up with counselor or psychiatry service.  Current Outpatient Prescriptions on File Prior to Visit  Medication Sig Dispense Refill  . acetaminophen (TYLENOL) 325 MG tablet Take 2 tablets (650 mg total) by mouth every 6 (six) hours as needed.      Marland Kitchen atovaquone (MEPRON) 750 MG/56ML suspension Take 5 mLs (750 mg total) by mouth every 12 (twelve) hours.  210 mL  11  . azithromycin (ZITHROMAX) 600 MG tablet Take 2 tablets (1,200 mg total) by mouth every 7 (seven) days.  8 tablet  3  . cefUROXime (CEFTIN) 500 MG tablet Take 1 tablet (500 mg total) by mouth 2 (two) times daily.  8 tablet  0  . dexlansoprazole (DEXILANT) 60 MG capsule Take 60 mg by mouth at bedtime.       . diazepam (VALIUM) 2 MG tablet Take 1 tablet (2 mg total) by mouth every 12 (twelve) hours as needed for anxiety.  30 tablet  0  . elvitegravir-cobicistat-emtricitabine-tenofovir (STRIBILD) 150-150-200-300 MG TABS tablet Take 1 tablet by mouth daily with breakfast.  30 tablet  11  . risperiDONE (RISPERDAL M-TABS) 1 MG disintegrating tablet 1 tablet by mouth each morning 2 tablets each evening  90 tablet  11   No current facility-administered medications on file prior to visit.   Active  Ambulatory Problems    Diagnosis Date Noted  . Hypotension 01/03/2012  . ARF (acute renal failure) 01/03/2012  . Rash 01/03/2012  . HTN (hypertension) 01/03/2012  . Orthostatic hypotension 05/20/2013  . Chest pain on exertion 05/20/2013  . Weight loss 05/20/2013  . Bipolar disorder 05/20/2013  . GERD (gastroesophageal reflux disease) 05/20/2013  . Malnutrition of moderate degree 05/21/2013  . H/O: substance abuse 06/09/2013  . Anxiety 06/10/2013  . Human immunodeficiency virus (HIV) disease 05/21/2013  . Fever and chills 06/25/2013  . PNA (pneumonia) 06/25/2013  . Homelessness 06/27/2013  . Weakness generalized 06/27/2013  . Abnormal TSH 06/27/2013  . Anemia 06/27/2013   Resolved Ambulatory Problems    Diagnosis Date Noted  . No Resolved Ambulatory Problems   Past Medical History  Diagnosis Date  . Hypertension   . Depressed   . HIV (human immunodeficiency virus infection)      Review of Systems     Objective:   Physical Exam BP 135/85  Pulse 116  Temp(Src) 98.2 F (36.8 C) (Oral)  Wt 167 lb (75.751 kg) Physical Exam  Constitutional: He is oriented to person, place, and time. He appears well-developed and well-nourished. No distress.  HENT:  Mouth/Throat: Oropharynx is clear and moist. No oropharyngeal exudate.  Cardiovascular: Normal rate, regular rhythm and normal  heart sounds. Exam reveals no gallop and no friction rub.  No murmur heard.  Pulmonary/Chest: Effort normal and breath sounds normal. No respiratory distress. He has no wheezes.  Abdominal: Soft. Bowel sounds are normal. He exhibits no distension. There is no tenderness.  Lymphadenopathy:  He has no cervical adenopathy.  Neurological: He is alert and oriented to person, place, and time.  Skin: Skin is warm and dry. No rash noted. No erythema.  Psychiatric: He has a normal mood and affect. His behavior is normal. rocking      Assessment & Plan:  Bipolar = will need to go back to 1mg  daytime, and  2mg  evening. Will refill his valium. He has psych appt in 3 wks to take over medications.  hiv = still poorly controlled, but will check viral load at next visit at hte 3 month mark of being on meds.  oi proph = continue with azithromycin and mepron. Can likely d/c azithromycin at next visit.  Insomnia = likely related to decrease dose of risperidone. Will have him follow up with psychiatry to titrate meds  Shelter/food/heat = will see if any additional services can be provided by thp  rtc in 4-6 wks

## 2013-09-27 ENCOUNTER — Encounter: Payer: Self-pay | Admitting: Internal Medicine

## 2013-10-06 ENCOUNTER — Encounter: Payer: Self-pay | Admitting: Internal Medicine

## 2013-10-08 ENCOUNTER — Encounter: Payer: Self-pay | Admitting: *Deleted

## 2013-10-08 NOTE — Progress Notes (Signed)
Patient ID: Ryan [name], male   DOB: 1971-04-23, 43 y.o.   MRN: [id number] Updated medication list with new prescription of Seroquel 50mg  QHS prescribed by pt's PCP as reported by patient.

## 2013-10-14 ENCOUNTER — Encounter: Payer: Self-pay | Admitting: Internal Medicine

## 2013-10-15 ENCOUNTER — Encounter: Payer: Self-pay | Admitting: Gastroenterology

## 2013-10-15 ENCOUNTER — Other Ambulatory Visit: Payer: Self-pay | Admitting: Internal Medicine

## 2013-10-18 ENCOUNTER — Encounter: Payer: Self-pay | Admitting: Infectious Disease

## 2013-10-18 ENCOUNTER — Ambulatory Visit (INDEPENDENT_AMBULATORY_CARE_PROVIDER_SITE_OTHER): Payer: Medicaid Other | Admitting: Infectious Disease

## 2013-10-18 VITALS — BP 100/66 | HR 90 | Temp 98.8°F | Wt 178.0 lb

## 2013-10-18 DIAGNOSIS — R6510 Systemic inflammatory response syndrome (SIRS) of non-infectious origin without acute organ dysfunction: Secondary | ICD-10-CM

## 2013-10-18 DIAGNOSIS — Z23 Encounter for immunization: Secondary | ICD-10-CM

## 2013-10-18 DIAGNOSIS — I889 Nonspecific lymphadenitis, unspecified: Secondary | ICD-10-CM

## 2013-10-18 DIAGNOSIS — B20 Human immunodeficiency virus [HIV] disease: Secondary | ICD-10-CM

## 2013-10-18 DIAGNOSIS — D893 Immune reconstitution syndrome: Secondary | ICD-10-CM

## 2013-10-18 LAB — COMPLETE METABOLIC PANEL WITH GFR
ALBUMIN: 3.8 g/dL (ref 3.5–5.2)
ALT: 16 U/L (ref 0–53)
AST: 14 U/L (ref 0–37)
Alkaline Phosphatase: 84 U/L (ref 39–117)
BUN: 14 mg/dL (ref 6–23)
CALCIUM: 9 mg/dL (ref 8.4–10.5)
CO2: 27 mEq/L (ref 19–32)
CREATININE: 1.17 mg/dL (ref 0.50–1.35)
Chloride: 99 mEq/L (ref 96–112)
GFR, EST NON AFRICAN AMERICAN: 76 mL/min
GFR, Est African American: 88 mL/min
GLUCOSE: 90 mg/dL (ref 70–99)
POTASSIUM: 4.1 meq/L (ref 3.5–5.3)
Sodium: 137 mEq/L (ref 135–145)
Total Bilirubin: 0.3 mg/dL (ref 0.3–1.2)
Total Protein: 6.9 g/dL (ref 6.0–8.3)

## 2013-10-18 LAB — CBC WITH DIFFERENTIAL/PLATELET
Basophils Absolute: 0.1 10*3/uL (ref 0.0–0.1)
Basophils Relative: 1 % (ref 0–1)
Eosinophils Absolute: 0.1 10*3/uL (ref 0.0–0.7)
Eosinophils Relative: 2 % (ref 0–5)
HEMATOCRIT: 38.8 % — AB (ref 39.0–52.0)
Hemoglobin: 12.9 g/dL — ABNORMAL LOW (ref 13.0–17.0)
LYMPHS PCT: 26 % (ref 12–46)
Lymphs Abs: 1.3 10*3/uL (ref 0.7–4.0)
MCH: 29.5 pg (ref 26.0–34.0)
MCHC: 33.2 g/dL (ref 30.0–36.0)
MCV: 88.6 fL (ref 78.0–100.0)
Monocytes Absolute: 0.5 10*3/uL (ref 0.1–1.0)
Monocytes Relative: 10 % (ref 3–12)
NEUTROS ABS: 3.1 10*3/uL (ref 1.7–7.7)
Neutrophils Relative %: 61 % (ref 43–77)
PLATELETS: 260 10*3/uL (ref 150–400)
RBC: 4.38 MIL/uL (ref 4.22–5.81)
RDW: 14.2 % (ref 11.5–15.5)
WBC: 5.2 10*3/uL (ref 4.0–10.5)

## 2013-10-18 LAB — LACTATE DEHYDROGENASE: LDH: 120 U/L (ref 94–250)

## 2013-10-18 NOTE — Progress Notes (Signed)
Subjective:    Patient ID: Ryan [name], male    DOB: 02/13/1971, 43 y.o.   MRN: [id number]  HPI  43 year old man with HIV and AIDS recently diagnosed this past fall started on STRIBILD. He did not initially suppress his virus as quickly as one would expect although his viral load was greater than 1 million prior to starting medicines. He states he been highly adherent to his antiretroviral medication. Since then certainly he has had dramatic in the in reconstitution with a CD4 count rising from 20 and August 2 330 in November.  He states that last week he experienced abrupt onset of right axillary swelling and he saw his primary care physician who prescribed doxycycline since then his axillary swelling has diminished in size although is still quite sizable at least 3 inches in greatest dimensions. The area is not erythematous and has not drained any pus. He has been taking azithromycin for Mycobacterium avium prophylaxis he has an indeterminate quantiferon gold. He is without fevers chills malaise or vomiting he has no cough.  Review of Systems  Constitutional: Negative for fever, chills, diaphoresis, activity change, appetite change, fatigue and unexpected weight change.  HENT: Negative for congestion, rhinorrhea, sinus pressure, sneezing, sore throat and trouble swallowing.   Eyes: Negative for photophobia and visual disturbance.  Respiratory: Negative for cough, chest tightness, shortness of breath, wheezing and stridor.   Cardiovascular: Negative for chest pain, palpitations and leg swelling.  Gastrointestinal: Negative for nausea, vomiting, abdominal pain, diarrhea, constipation, blood in stool, abdominal distention and anal bleeding.  Genitourinary: Negative for dysuria, hematuria, flank pain and difficulty urinating.  Musculoskeletal: Negative for arthralgias, back pain, gait problem, joint swelling and myalgias.  Skin: Negative for color change, pallor, rash and wound.    Neurological: Negative for dizziness, tremors, weakness and light-headedness.  Hematological: Positive for adenopathy. Does not bruise/bleed easily.  Psychiatric/Behavioral: Negative for behavioral problems, confusion, sleep disturbance, dysphoric mood, decreased concentration and agitation.       Objective:   Physical Exam  Constitutional: He is oriented to person, place, and time. He appears well-developed and well-nourished. No distress.  HENT:  Head: Normocephalic and atraumatic.  Mouth/Throat: Oropharynx is clear and moist. No oropharyngeal exudate.  Eyes: Conjunctivae and EOM are normal. Pupils are equal, round, and reactive to light. No scleral icterus.  Neck: Normal range of motion. Neck supple. No JVD present.  Cardiovascular: Normal rate, regular rhythm and normal heart sounds.  Exam reveals no gallop and no friction rub.   No murmur heard. Pulmonary/Chest: Effort normal and breath sounds normal. No respiratory distress. He has no wheezes. He has no rales. He exhibits no tenderness.  Abdominal: He exhibits no distension and no mass. There is no tenderness. There is no rebound and no guarding.  Musculoskeletal: He exhibits no edema and no tenderness.  Lymphadenopathy:    He has cervical adenopathy.       Right cervical: Superficial cervical adenopathy present.       Left cervical: Superficial cervical adenopathy present.    He has axillary adenopathy.  Right axilla with very large area that may correspond to very enlarged lymph node versus an axillary abscess. See picture is not especially warm it is not erythematous and there is no purulence coming from the area  Neurological: He is alert and oriented to person, place, and time. He exhibits normal muscle tone. Coordination normal.  Skin: Skin is warm and dry. He is not diaphoretic. No erythema. No pallor.  Psychiatric: He  has a normal mood and affect. His behavior is normal. Judgment and thought content normal.             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 MRSA or MSSA infection are possible though the presentation is atypical with the patient not appearing very ill and the lack of erythema or purulent drainage.  Check AFB blood cultures today check a serum LDH and recheck quantiferon gold. He  may remain on the doxycycline. I'll have him come back to see me next week to see how this area is doing. He may need a potential biopsy for pathological exam as well as AFB and fungal and bacterial cultures.  I spent greater than25 minutes with the patient including greater than 50% of time in face to face counsel of the patient and in coordination of their care.   HIV/AIDS: His CD4 count is certainly rebounded dramatically over his viral load is now undetectable on STRIBILD. He is taking atovaquone for PCP prophylaxis and azithromycin for Mycobacterium avium prophylaxis.  Bipolar disorder: following with MH

## 2013-10-20 LAB — QUANTIFERON TB GOLD ASSAY (BLOOD)
Interferon Gamma Release Assay: NEGATIVE
MITOGEN VALUE: 8.22 [IU]/mL
QUANTIFERON NIL VALUE: 0.17 [IU]/mL
Quantiferon Tb Ag Minus Nil Value: 0 IU/mL
TB Ag value: 0.17 IU/mL

## 2013-10-20 LAB — T-HELPER CELL (CD4) - (RCID CLINIC ONLY)
CD4 T CELL ABS: 290 /uL — AB (ref 400–2700)
CD4 T CELL HELPER: 22 % — AB (ref 33–55)

## 2013-10-20 LAB — HIV-1 RNA ULTRAQUANT REFLEX TO GENTYP+
HIV 1 RNA Quant: 9564 copies/mL — ABNORMAL HIGH (ref <20)
HIV-1 RNA Quant, Log: 3.98 {Log} — ABNORMAL HIGH (ref <1.30)

## 2013-10-22 LAB — HLA B*5701: HLA-B*5701 w/rflx HLA-B High: NEGATIVE

## 2013-10-29 ENCOUNTER — Encounter: Payer: Self-pay | Admitting: Infectious Disease

## 2013-10-29 ENCOUNTER — Other Ambulatory Visit: Payer: Self-pay | Admitting: Infectious Disease

## 2013-10-29 ENCOUNTER — Ambulatory Visit (INDEPENDENT_AMBULATORY_CARE_PROVIDER_SITE_OTHER): Payer: Medicaid Other | Admitting: Infectious Disease

## 2013-10-29 ENCOUNTER — Ambulatory Visit: Payer: Self-pay

## 2013-10-29 VITALS — BP 117/77 | HR 85 | Temp 98.4°F | Ht 70.0 in | Wt 175.0 lb

## 2013-10-29 DIAGNOSIS — R599 Enlarged lymph nodes, unspecified: Secondary | ICD-10-CM

## 2013-10-29 DIAGNOSIS — R6510 Systemic inflammatory response syndrome (SIRS) of non-infectious origin without acute organ dysfunction: Secondary | ICD-10-CM

## 2013-10-29 DIAGNOSIS — R590 Localized enlarged lymph nodes: Secondary | ICD-10-CM

## 2013-10-29 DIAGNOSIS — F3162 Bipolar disorder, current episode mixed, moderate: Secondary | ICD-10-CM

## 2013-10-29 DIAGNOSIS — B20 Human immunodeficiency virus [HIV] disease: Secondary | ICD-10-CM

## 2013-10-29 DIAGNOSIS — D893 Immune reconstitution syndrome: Secondary | ICD-10-CM

## 2013-10-29 LAB — T-HELPER CELL (CD4) - (RCID CLINIC ONLY)
CD4 T CELL HELPER: 19 % — AB (ref 33–55)
CD4 T Cell Abs: 230 /uL — ABNORMAL LOW (ref 400–2700)

## 2013-10-29 LAB — HIV-1 GENOTYPR PLUS

## 2013-10-29 MED ORDER — DOLUTEGRAVIR SODIUM 50 MG PO TABS: 50.0000 mg | ORAL_TABLET | Freq: Every day | ORAL | Status: DC

## 2013-10-29 MED ORDER — PROMETHAZINE HCL 25 MG PO TABS: 25.0000 mg | ORAL_TABLET | Freq: Four times a day (QID) | ORAL | Status: DC | PRN

## 2013-10-29 MED ORDER — RITONAVIR 100 MG PO TABS: 100.0000 mg | ORAL_TABLET | Freq: Every day | ORAL | Status: DC

## 2013-10-29 MED ORDER — EMTRICITABINE-TENOFOVIR DF 200-300 MG PO TABS: 1.0000 | ORAL_TABLET | Freq: Every day | ORAL | Status: DC

## 2013-10-29 MED ORDER — DARUNAVIR ETHANOLATE 800 MG PO TABS: 800.0000 mg | ORAL_TABLET | Freq: Every day | ORAL | Status: DC

## 2013-10-29 NOTE — Progress Notes (Signed)
Subjective:    Patient ID: Ryan [name], male    DOB: Sep 27, 1971, 43 y.o.   MRN: [id number]  HPI   43 year old man with HIV and AIDS recently diagnosed this past fall started on STRIBILD. He did not initially suppress his virus as quickly as one would expect although his viral load was greater than 1 million prior to starting medicines. He states he been highly adherent to his antiretroviral medication. Since then certainly he has had dramatic in the in reconstitution with a CD4 count rising from 20 and August 2 330 in November.  He states that last week he experienced abrupt onset of right axillary swelling and he saw his primary care physician who prescribed doxycycline since then his axillary swelling has diminished in size   Actually think his axilla lymphadenopathy with is more likely due to immune reconstitution.  Eye more closely reviewed his viral load trended he was still viremic when I last saw him. I find his viral load trended be not consistent with a patient taking a fully active regimen against an HIV virus over that period of time from September through January. Either he has not been compliant as he states he hasn't he is adamant that he has not missed any doses or this patient has resistant virus. I am concerned that he may have come into this with an integrates inhibitor resistance mutations that was not picked up by her standard genotype assay. If that were the case he may develop worsening resistance now but substantially to his Reverse transcriptase inhibitor backbone.    Review of Systems  Constitutional: Negative for fever, chills, diaphoresis, activity change, appetite change, fatigue and unexpected weight change.  HENT: Negative for congestion, rhinorrhea, sinus pressure, sneezing, sore throat and trouble swallowing.   Eyes: Negative for photophobia and visual disturbance.  Respiratory: Negative for cough, chest tightness, shortness of breath, wheezing and stridor.    Cardiovascular: Negative for chest pain, palpitations and leg swelling.  Gastrointestinal: Negative for nausea, vomiting, abdominal pain, diarrhea, constipation, blood in stool, abdominal distention and anal bleeding.  Genitourinary: Negative for dysuria, hematuria, flank pain and difficulty urinating.  Musculoskeletal: Negative for arthralgias, back pain, gait problem, joint swelling and myalgias.  Skin: Negative for color change, pallor, rash and wound.  Neurological: Negative for dizziness, tremors, weakness and light-headedness.  Hematological: Positive for adenopathy. Does not bruise/bleed easily.  Psychiatric/Behavioral: Negative for behavioral problems, confusion, sleep disturbance, dysphoric mood, decreased concentration and agitation.       Objective:   Physical Exam  Constitutional: He is oriented to person, place, and time. He appears well-developed and well-nourished. No distress.  HENT:  Head: Normocephalic and atraumatic.  Mouth/Throat: Oropharynx is clear and moist. No oropharyngeal exudate.  Eyes: Conjunctivae and EOM are normal. Pupils are equal, round, and reactive to light. No scleral icterus.  Neck: Normal range of motion. Neck supple. No JVD present.  Cardiovascular: Normal rate, regular rhythm and normal heart sounds.  Exam reveals no gallop and no friction rub.   No murmur heard. Pulmonary/Chest: Effort normal and breath sounds normal. No respiratory distress. He has no wheezes. He has no rales. He exhibits no tenderness.  Abdominal: He exhibits no distension and no mass. There is no tenderness. There is no rebound and no guarding.  Musculoskeletal: He exhibits no edema and no tenderness.  Lymphadenopathy:    He has cervical adenopathy.       Right cervical: Superficial cervical adenopathy present.       Left  cervical: Superficial cervical adenopathy present.    He has axillary adenopathy.  Right axilla with very large area that may correspond to very enlarged  lymph node versus an axillary abscess. See picture is not especially warm it is not erythematous and there is no purulence coming from the area  Neurological: He is alert and oriented to person, place, and time. He exhibits normal muscle tone. Coordination normal.  Skin: Skin is warm and dry. He is not diaphoretic. No erythema. No pallor.  Psychiatric: He has a normal mood and affect. His behavior is normal. Judgment and thought content normal.    Lymph nodes ask the smaller today than it was before but still fairly sizable:                Assessment & Plan:  HIV/AIDS: His story does not make sense unless he had transmitted resistance to integrates inhibitor which is possible since her assay does not look for integrates inhibitor resistance.  I am grafting a new regimen with the following drugs Prezista 800 mg once daily Norvir 100 mg once daily Truvada 1 tablet once daily and TIVICAY 50 mg twice daily.  Will recheck an HIV viral load with altered quantitative genotype and HIV integrates genotype today.  I spent greater than 40 minutes with the patient including greater than 50% of time in face to face counsel of the patient and in coordination of their care.    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     Bipolar disorder: following with MH

## 2013-10-29 NOTE — Patient Instructions (Signed)
YOUR NEW REGIMEN IS:  PREZISTA 800 MG (MAROON PILL) ONCE DAILY   WITH   NORVIR 100MG  (WHITE PILL) ONCE DAILY  AND  TRUVADA (BLUE PILL ONCE DAILY)  AND TIVICAY (YELLOW CIRCULAR PILL) TWICE DAILY  TAKE THE PHENERGAN ONE HOUR PRIOR TO THE COMBINATION OF PREZISTA, NORVIR, TRUVADA AND TIVICAY  YOU CAN THEN TAKE IT FOUR TIMES A DAY FOR NAUSEA IF STILL NAUSEOUS  THE 2ND DOSE OF TIVICAY SHOULD NOT CAUSE NAUSEA

## 2013-11-01 ENCOUNTER — Other Ambulatory Visit: Payer: Self-pay | Admitting: *Deleted

## 2013-11-01 DIAGNOSIS — D893 Immune reconstitution syndrome: Secondary | ICD-10-CM

## 2013-11-01 DIAGNOSIS — B20 Human immunodeficiency virus [HIV] disease: Secondary | ICD-10-CM

## 2013-11-01 DIAGNOSIS — F3162 Bipolar disorder, current episode mixed, moderate: Secondary | ICD-10-CM

## 2013-11-01 DIAGNOSIS — R590 Localized enlarged lymph nodes: Secondary | ICD-10-CM

## 2013-11-01 LAB — HIV-1 RNA ULTRAQUANT REFLEX TO GENTYP+
HIV 1 RNA Quant: 6539 copies/mL — ABNORMAL HIGH (ref <20)
HIV-1 RNA Quant, Log: 3.82 {Log} — ABNORMAL HIGH (ref <1.30)

## 2013-11-01 MED ORDER — EMTRICITABINE-TENOFOVIR DF 200-300 MG PO TABS: 1.0000 | ORAL_TABLET | Freq: Every day | ORAL | Status: DC

## 2013-11-01 MED ORDER — DARUNAVIR ETHANOLATE 800 MG PO TABS: 800.0000 mg | ORAL_TABLET | Freq: Every day | ORAL | Status: DC

## 2013-11-01 MED ORDER — RITONAVIR 100 MG PO TABS: 100.0000 mg | ORAL_TABLET | Freq: Every day | ORAL | Status: DC

## 2013-11-01 MED ORDER — DOLUTEGRAVIR SODIUM 50 MG PO TABS: 50.0000 mg | ORAL_TABLET | Freq: Every day | ORAL | Status: DC

## 2013-11-09 ENCOUNTER — Encounter: Payer: Self-pay | Admitting: Internal Medicine

## 2013-11-09 ENCOUNTER — Ambulatory Visit (INDEPENDENT_AMBULATORY_CARE_PROVIDER_SITE_OTHER): Payer: Medicaid Other | Admitting: Internal Medicine

## 2013-11-09 VITALS — BP 127/80 | HR 89 | Temp 97.6°F | Wt 182.0 lb

## 2013-11-09 DIAGNOSIS — B20 Human immunodeficiency virus [HIV] disease: Secondary | ICD-10-CM

## 2013-11-09 DIAGNOSIS — G4700 Insomnia, unspecified: Secondary | ICD-10-CM

## 2013-11-09 DIAGNOSIS — F3162 Bipolar disorder, current episode mixed, moderate: Secondary | ICD-10-CM

## 2013-11-09 DIAGNOSIS — D893 Immune reconstitution syndrome: Secondary | ICD-10-CM

## 2013-11-09 DIAGNOSIS — R6510 Systemic inflammatory response syndrome (SIRS) of non-infectious origin without acute organ dysfunction: Secondary | ICD-10-CM

## 2013-11-09 DIAGNOSIS — R599 Enlarged lymph nodes, unspecified: Secondary | ICD-10-CM

## 2013-11-09 DIAGNOSIS — R590 Localized enlarged lymph nodes: Secondary | ICD-10-CM

## 2013-11-09 LAB — HIV-1 INTEGRASE GENOTYPE

## 2013-11-09 LAB — HIV-1 GENOTYPR PLUS

## 2013-11-09 MED ORDER — DOLUTEGRAVIR SODIUM 50 MG PO TABS: 50.0000 mg | ORAL_TABLET | Freq: Two times a day (BID) | ORAL | Status: DC

## 2013-11-09 MED ORDER — QUETIAPINE FUMARATE 50 MG PO TABS: 100.0000 mg | ORAL_TABLET | Freq: Every day | ORAL | Status: DC

## 2013-11-09 NOTE — Progress Notes (Signed)
Subjective:    Patient ID: Ryan [name], male    DOB: 1971/01/01, 43 y.o.   MRN: [id number]  HPI Ryan [name] is a43yo M with HIV/AIDS, bipolar disease, initial Cd 4 count of 20/VL 1.66M (end Aug 2014) started stribild and had brisk immune response with CD 4 count up to 330 (nov 2014) and [id number] after 1 month of therapy. He was seen in [facility] Jan 2015, repeat labs  shwoed Cd 4 count of 290/VL 9,564 which showed have been < 3,000 if he was following same trajectory with viral suppression. He also had concurrent axillary lymphadenopathy which is thought to be consistent with immune reconstitution. He had afb cx in Sep 2014 which was negative. Repeat afb cx in mid Jan 2015 NGTD thus far at 2-3 wk mark. Due to concern of developing resistance he was pre-emptively changed to TIVICAY BID, DRVr/truvada daily and stribild discontinued. Genotype and II changed.  Now living back with his mother.  Risperidone only take 1 dose in the daytime. And seroquel 100mg  qhs  Current Outpatient Prescriptions on File Prior to Visit  Medication Sig Dispense Refill  . acetaminophen (TYLENOL) 325 MG tablet Take 2 tablets (650 mg total) by mouth every 6 (six) hours as needed.      Marland Kitchen atovaquone (MEPRON) 750 MG/5ML suspension TAKE 5 ML BY MOUTH EVERY 12 HOURS  210 mL  1  . Darunavir Ethanolate (PREZISTA) 800 MG tablet Take 1 tablet (800 mg total) by mouth daily.  30 tablet  11  . dexlansoprazole (DEXILANT) 60 MG capsule Take 60 mg by mouth at bedtime.       . dolutegravir (TIVICAY) 50 MG tablet Take 1 tablet (50 mg total) by mouth daily.  60 tablet  11  . emtricitabine-tenofovir (TRUVADA) 200-300 MG per tablet Take 1 tablet by mouth daily.  30 tablet  11  . promethazine (PHENERGAN) 25 MG tablet Take 1 tablet (25 mg total) by mouth every 6 (six) hours as needed for nausea or vomiting.  30 tablet  0  . QUEtiapine (SEROQUEL) 50 MG tablet Take 50 mg by mouth at bedtime.      . risperiDONE (RISPERDAL M-TABS) 1 MG disintegrating  tablet 1 tablet by mouth each morning 2 tablets each evening  90 tablet  11  . ritonavir (NORVIR) 100 MG TABS tablet Take 1 tablet (100 mg total) by mouth daily.  30 tablet  11   No current facility-administered medications on file prior to visit.   Active Ambulatory Problems    Diagnosis Date Noted  . Hypotension 01/03/2012  . ARF (acute renal failure) 01/03/2012  . Rash 01/03/2012  . HTN (hypertension) 01/03/2012  . Orthostatic hypotension 05/20/2013  . Chest pain on exertion 05/20/2013  . Weight loss 05/20/2013  . Bipolar disorder 05/20/2013  . GERD (gastroesophageal reflux disease) 05/20/2013  . Malnutrition of moderate degree 05/21/2013  . H/O: substance abuse 06/09/2013  . Anxiety 06/10/2013  . Human immunodeficiency virus (HIV) disease 05/21/2013  . Fever and chills 06/25/2013  . PNA (pneumonia) 06/25/2013  . Homelessness 06/27/2013  . Weakness generalized 06/27/2013  . Abnormal TSH 06/27/2013  . Anemia 06/27/2013   Resolved Ambulatory Problems    Diagnosis Date Noted  . No Resolved Ambulatory Problems   Past Medical History  Diagnosis Date  . Hypertension   . Depressed   . HIV (human immunodeficiency virus infection)        Review of Systems Review of Systems  Constitutional: Negative for fever, chills, diaphoresis, activity change,  appetite change, fatigue and unexpected weight change.  HENT: Negative for congestion, sore throat, rhinorrhea, sneezing, trouble swallowing and sinus pressure.  Eyes: Negative for photophobia and visual disturbance.  Respiratory: Negative for cough, chest tightness, shortness of breath, wheezing and stridor.  Cardiovascular: Negative for chest pain, palpitations and leg swelling.  Gastrointestinal: Negative for nausea, vomiting, abdominal pain, diarrhea, constipation, blood in stool, abdominal distention and anal bleeding.  Genitourinary: Negative for dysuria, hematuria, flank pain and difficulty urinating.  Musculoskeletal:  Negative for myalgias, back pain, joint swelling, arthralgias and gait problem.  Skin: Negative for color change, pallor, rash and wound.  Neurological: Negative for dizziness, tremors, weakness and light-headedness.  Hematological: Negative for adenopathy. Does not bruise/bleed easily.  Psychiatric/Behavioral: really depressed. No immediate plan for SI      Objective:   Physical Exam BP 127/80  Pulse 89  Temp(Src) 97.6 F (36.4 C) (Oral)  Wt 182 lb (82.555 kg) Physical Exam  Constitutional: He is oriented to person, place, and time. He appears well-developed and well-nourished. No distress.  HENT:  Mouth/Throat: Oropharynx is clear and moist. No oropharyngeal exudate.  Cardiovascular: Normal rate, regular rhythm and normal heart sounds. Exam reveals no gallop and no friction rub.  No murmur heard.  Pulmonary/Chest: Effort normal and breath sounds normal. No respiratory distress. He has no wheezes.  Axillary = axillary LN on right is 1.5-1.8cm in diameter, smaller than at last visit Abdominal: Soft. Bowel sounds are normal. He exhibits no distension. There is no tenderness.  Lymphadenopathy:  He has no cervical adenopathy.  Neurological: He is alert and oriented to person, place, and time.  Skin: Skin is warm and dry. No rash noted. No erythema.  Psychiatric: He has a normal mood and affect. His behavior is normal.       Assessment & Plan:  Hiv disease= continue with salvage regimen for now. Increase DLG to BID instead of once a day. Unclear if patient was truly having viral suppression failure since he was only on his 4 month of therapy when his viral load was 6-9K, down from 1.5 M viral copies. We will wait for genotype to return to decide if need to continue on current PI based regimen vs. Going back to stribild in order to minimize complex regimen and pill burden given his mental health  oi proph = can stop azithromycin and dapsone  Insomnia = will give seroquel 100mg  QHS  with 5 refills  Axillary LN = c/w immune reconstitution. Will follow up on afb blood cx. Thus far, all is negative  Bipolar disease = increase risperidone 1mg  bid. Asked him to follow through with psychiatry   Spent 40 min with patient with greater than 50% on adherence counseling, management of his bipolar disease/depression  rtc in 3 wks

## 2013-11-15 NOTE — Progress Notes (Signed)
Patient ID: Ryan [name], male   DOB: 1971-04-25, 43 y.o.   MRN: [id number] HPI: Ryan [name] is a 43 y.o. male for his HIV visit  Allergies: Allergies  Allergen Reactions  . Levaquin [Levofloxacin In D5w] Other (See Comments)    Pain & nerve damage in fingers  . Sulfa Antibiotics Rash    Vitals:    Past Medical History: Past Medical History  Diagnosis Date  . Hypertension   . Bipolar disorder   . Depressed   . HIV (human immunodeficiency virus infection)   . H/O: substance abuse 06/09/2013    Crack cocaine,  marijuana    Social History: History   Social History  . Marital Status: Single    Spouse Name: N/A    Number of Children: N/A  . Years of Education: N/A   Social History Main Topics  . Smoking status: Never Smoker   . Smokeless tobacco: None  . Alcohol Use: No  . Drug Use: Yes    Special: Cocaine, Marijuana, Other-see comments     Comment: presription pills - history but has not done any in 1 year  . Sexual Activity: Not Currently   Other Topics Concern  . None   Social History Narrative  . None    Previous Regimen:   Current Regimen: DTG + Truvada + DRV/r  Labs: HIV 1 RNA Quant (copies/mL)  Date Value  10/29/2013 6539*  10/18/2013 9564*  08/19/2013 34196*     CD4 T Cell Abs (/uL)  Date Value  10/29/2013 230*  10/18/2013 290*  08/19/2013 330*     Hep B S Ab (no units)  Date Value  06/03/2013 NEG      Hepatitis B Surface Ag (no units)  Date Value  06/03/2013 NEGATIVE      HCV Ab (no units)  Date Value  06/03/2013 NEGATIVE     CrCl: The CrCl is unknown because both a height and weight (above a minimum accepted value) are required for this calculation.  Lipids:    Component Value Date/Time   CHOL 121 06/03/2013 1524   TRIG 782* 06/03/2013 1524   HDL 25* 06/03/2013 1524   CHOLHDL 4.8 06/03/2013 1524   VLDL NOT CALC 06/03/2013 1524   LDLCALC Comment:   Not calculated due to Triglyceride >400. Suggest ordering Direct LDL (Unit  Code: [phone]).   Total Cholesterol/HDL Ratio:CHD Risk                        Coronary Heart Disease Risk Table                                        Men       Women          1/2 Average Risk              3.4        3.3              Average Risk              5.0        4.4           2X Average Risk              9.6        7.1  3X Average Risk             23.4       11.0 Use the calculated Patient Ratio above and the CHD Risk table  to determine the patient's CHD Risk. ATP III Classification (LDL):       < 100        mg/dL         Optimal      100 - 129     mg/dL         Near or Above Optimal      130 - 159     mg/dL         Borderline High      160 - 189     mg/dL         High       > 190        mg/dL         Very High   06/03/2013 1524    Assessment:  Pt has a very high baseline VL. It has trended down. He was switched to the current regimen due to the worry about integrase resistance. The integrase resistance panel is pending. Will wait until it's back before prob simplify back to Stribild.   Recommendations: Cont current regimen F/u with integrase resistance to see if we can simplify  Wilfred Lacy, PharmD Clinical [facility] 11/15/2013, 9:22 PM

## 2013-11-30 LAB — AFB CULTURE, BLOOD

## 2013-12-02 ENCOUNTER — Ambulatory Visit: Payer: Self-pay | Admitting: Internal Medicine

## 2013-12-10 ENCOUNTER — Telehealth: Payer: Self-pay | Admitting: *Deleted

## 2013-12-10 NOTE — Telephone Encounter (Signed)
Patient's sister called to let Dr. Baxter Flattery know that patient is suffering from ongoing depression and hopelessness. He did see a mental health provider but she is not sure if he is going to go back. She was concerned that the psychiatrist may not be sure about contraindications with his HIV meds. I assured her that we have several patients on mental health medications along with HIV meds and they would know how to dose it. Patient has follow up appointment with Dr. Baxter Flattery on Monday 12/13/13. Myrtis Hopping

## 2013-12-13 ENCOUNTER — Other Ambulatory Visit: Payer: Self-pay | Admitting: *Deleted

## 2013-12-13 ENCOUNTER — Encounter: Payer: Self-pay | Admitting: Internal Medicine

## 2013-12-13 ENCOUNTER — Ambulatory Visit (INDEPENDENT_AMBULATORY_CARE_PROVIDER_SITE_OTHER): Payer: Medicaid Other | Admitting: Internal Medicine

## 2013-12-13 VITALS — BP 121/81 | HR 99 | Temp 97.2°F | Wt 185.0 lb

## 2013-12-13 DIAGNOSIS — D893 Immune reconstitution syndrome: Secondary | ICD-10-CM

## 2013-12-13 DIAGNOSIS — F3162 Bipolar disorder, current episode mixed, moderate: Secondary | ICD-10-CM

## 2013-12-13 DIAGNOSIS — R590 Localized enlarged lymph nodes: Secondary | ICD-10-CM

## 2013-12-13 DIAGNOSIS — R6510 Systemic inflammatory response syndrome (SIRS) of non-infectious origin without acute organ dysfunction: Secondary | ICD-10-CM

## 2013-12-13 DIAGNOSIS — R599 Enlarged lymph nodes, unspecified: Secondary | ICD-10-CM

## 2013-12-13 DIAGNOSIS — B20 Human immunodeficiency virus [HIV] disease: Secondary | ICD-10-CM

## 2013-12-13 DIAGNOSIS — F411 Generalized anxiety disorder: Secondary | ICD-10-CM

## 2013-12-13 DIAGNOSIS — I10 Essential (primary) hypertension: Secondary | ICD-10-CM

## 2013-12-13 MED ORDER — DOLUTEGRAVIR SODIUM 50 MG PO TABS: 50.0000 mg | ORAL_TABLET | Freq: Two times a day (BID) | ORAL | Status: DC

## 2013-12-13 MED ORDER — DOLUTEGRAVIR SODIUM 50 MG PO TABS: 50.0000 mg | ORAL_TABLET | Freq: Every day | ORAL | Status: DC

## 2013-12-13 MED ORDER — SERTRALINE HCL 25 MG PO TABS: 25.0000 mg | ORAL_TABLET | Freq: Every day | ORAL | Status: DC

## 2013-12-13 MED ORDER — METOPROLOL SUCCINATE ER 50 MG PO TB24: 50.0000 mg | ORAL_TABLET | Freq: Every day | ORAL | Status: DC

## 2013-12-13 NOTE — Progress Notes (Signed)
Subjective:    Patient ID: Ryan [name], male    DOB: 1971/08/28, 43 y.o.   MRN: [id number]  HPI Ryan [name] is a73yo M with HIV/AIDS, bipolar disease, initial Cd 4 count of 20/VL 1.32M (end Aug 2014) initially started on stribild with brisk response. He was seen in [facility] Jan 2015, labs Cd 4 count of 290/VL 9,564 which showed have been < 3,000 if he was following same trajectory with viral suppression. He also had concurrent axillary lymphadenopathy which is thought to be consistent with immune reconstitution.  afb cx  NGTD. Due to concern of developing resistance he was pre-emptively changed to TIVICAY BID, DRVr/truvada daily and stribild discontinued. He presents today for follow up. He has no integrase resistance. He is still living with his mother and has started to see tele-psychiatry as well as therapist. He states that he is now taking risperidone TID and recently prescribed zoloft, but hasn't started the medication yet. His depressed mood is his main concern. He does not have plan to hurt himself or others. He feels his right axillary LAD is still the same  Current Outpatient Prescriptions on File Prior to Visit  Medication Sig Dispense Refill  . acetaminophen (TYLENOL) 325 MG tablet Take 2 tablets (650 mg total) by mouth every 6 (six) hours as needed.      . Darunavir Ethanolate (PREZISTA) 800 MG tablet Take 1 tablet (800 mg total) by mouth daily.  30 tablet  11  . dexlansoprazole (DEXILANT) 60 MG capsule Take 60 mg by mouth at bedtime.       Marland Kitchen emtricitabine-tenofovir (TRUVADA) 200-300 MG per tablet Take 1 tablet by mouth daily.  30 tablet  11  . promethazine (PHENERGAN) 25 MG tablet Take 1 tablet (25 mg total) by mouth every 6 (six) hours as needed for nausea or vomiting.  30 tablet  0  . QUEtiapine (SEROQUEL) 50 MG tablet Take 2 tablets (100 mg total) by mouth at bedtime.  60 tablet  5  . risperiDONE (RISPERDAL M-TABS) 1 MG disintegrating tablet 1 tablet by mouth each morning 2 tablets each  evening  90 tablet  11  . ritonavir (NORVIR) 100 MG TABS tablet Take 1 tablet (100 mg total) by mouth daily.  30 tablet  11   No current facility-administered medications on file prior to visit.   Active Ambulatory Problems    Diagnosis Date Noted  . Hypotension 01/03/2012  . ARF (acute renal failure) 01/03/2012  . Rash 01/03/2012  . HTN (hypertension) 01/03/2012  . Orthostatic hypotension 05/20/2013  . Chest pain on exertion 05/20/2013  . Weight loss 05/20/2013  . Bipolar disorder 05/20/2013  . GERD (gastroesophageal reflux disease) 05/20/2013  . Malnutrition of moderate degree 05/21/2013  . H/O: substance abuse 06/09/2013  . Anxiety 06/10/2013  . Human immunodeficiency virus (HIV) disease 05/21/2013  . Fever and chills 06/25/2013  . PNA (pneumonia) 06/25/2013  . Homelessness 06/27/2013  . Weakness generalized 06/27/2013  . Abnormal TSH 06/27/2013  . Anemia 06/27/2013   Resolved Ambulatory Problems    Diagnosis Date Noted  . No Resolved Ambulatory Problems   Past Medical History  Diagnosis Date  . Hypertension   . Depressed   . HIV (human immunodeficiency virus infection)        Review of Systems 10 point ros reviewed except what is mentioned in hpi    Objective:   Physical Exam BP 121/81  Pulse 99  Temp(Src) 97.2 F (36.2 C) (Oral)  Wt 185 lb (83.915 kg) Physical  Exam  Constitutional: He is oriented to person, place, and time. He appears well-developed and well-nourished. No distress. But disheveled HENT:  Mouth/Throat: Oropharynx is clear and moist. No oropharyngeal exudate.  Cardiovascular: Normal rate, regular rhythm and normal heart sounds. Exam reveals no gallop and no friction rub.  No murmur heard.  Pulmonary/Chest: Effort normal and breath sounds normal. No respiratory distress. He has no wheezes.  Abdominal: Soft. Bowel sounds are normal. He exhibits no distension. There is no tenderness.  Lymphadenopathy:  no cervical adenopathy. + right axillary  LAD firm non-mobile but improved. Neurological: He is alert and oriented to person, place, and time. +akithesia of legs Skin: Skin is warm and dry. No rash noted. No erythema.  Psychiatric: He has a normal mood and affect. His behavior is normal.        Assessment & Plan:  HIV = poorly controlled, however he has only been on treatment for 4 months. We will check viral load today to see how he is doing since change of his regimen. Since no II resistance, we can switch to dlg daily in addition to truvada/DRVr. AT next visit, can consider changing to new agent possibly back to stribild..  Mental health = will give refill on zoloft 25mg , to take 1/2 tab until he sees psychiatry on 23rd as he was prescribed.  Anxiety = previously was on metop for palpitatiosn which he reports helped his symptoms. We will refill.  rtc in 4 wk

## 2013-12-13 NOTE — Telephone Encounter (Signed)
Spoke with Dr. Baxter Flattery.  The Tivicay rx was changed this AM to once daily w/ #30 tablets.  Pharmacy informed.

## 2013-12-13 NOTE — Progress Notes (Signed)
Metoprolol rx needs to be sent through at "brand" name, Toprol XR for Medicaid to approve.  [facility] informed.

## 2013-12-14 LAB — HIV-1 RNA QUANT-NO REFLEX-BLD
HIV 1 RNA QUANT: 1901 {copies}/mL — AB (ref <20)
HIV-1 RNA QUANT, LOG: 3.28 {Log} — AB (ref <1.30)

## 2013-12-26 ENCOUNTER — Encounter: Payer: Self-pay | Admitting: Internal Medicine

## 2013-12-27 ENCOUNTER — Telehealth: Payer: Self-pay | Admitting: *Deleted

## 2013-12-27 NOTE — Telephone Encounter (Signed)
Called patient.  He is unable to come today for an appointment, will come 3/24 2:45 with Dr. Baxter Flattery. Landis Gandy, RN  Pt's email: The lump under my right arm burst during the (12/25/13) night. There is a yellow discharge coming out of it. What do I need to do about this? How should it be treated? Call [phone] Hm., or [phone] to advise. Thanks!

## 2013-12-28 ENCOUNTER — Ambulatory Visit (INDEPENDENT_AMBULATORY_CARE_PROVIDER_SITE_OTHER): Payer: Medicaid Other | Admitting: Internal Medicine

## 2013-12-28 ENCOUNTER — Encounter: Payer: Self-pay | Admitting: Internal Medicine

## 2013-12-28 VITALS — BP 116/75 | HR 88 | Temp 98.5°F | Wt 178.0 lb

## 2013-12-28 DIAGNOSIS — A4902 Methicillin resistant Staphylococcus aureus infection, unspecified site: Secondary | ICD-10-CM

## 2013-12-28 DIAGNOSIS — I889 Nonspecific lymphadenitis, unspecified: Secondary | ICD-10-CM

## 2013-12-28 DIAGNOSIS — F4323 Adjustment disorder with mixed anxiety and depressed mood: Secondary | ICD-10-CM

## 2013-12-28 DIAGNOSIS — B20 Human immunodeficiency virus [HIV] disease: Secondary | ICD-10-CM

## 2013-12-28 MED ORDER — DIAZEPAM 2 MG PO TABS: 2.0000 mg | ORAL_TABLET | Freq: Two times a day (BID) | ORAL | Status: DC | PRN

## 2013-12-28 NOTE — Progress Notes (Signed)
Subjective:    Patient ID: Ryan [name], male    DOB: 1970/10/11, 43 y.o.   MRN: [id number]  HPI Ryan [name] is a 43yo M with HIV/AIDS, bipolar disease, initial Cd 4 count of 20/VL 1.37M (end Aug 2014) initially started on stribild with brisk response. seen in [facility] Jan 2015, labs Cd 4 count of 290/VL 9,564  He also had concurrent axillary lymphadenopathy which is thought to be consistent with immune reconstitution. afb cx NGTD.he was pre-emptively changed to DLG/ DRVr/truvada daily and stribild discontinued. Seen on 3/2 where VL was 1901 after 1 month of taking new regimen.   This past week, He has had a right axillary lump drain purulent lesions.no fever or chills   He is still living with his mother and he has not been to his recent telemedicine psych apt. He states that he is now taking risperidone BID( but supposed to be TID) . He states that his anxiety is very difficult to manage. He feels his mind racing. He previously was taking valium which helped but not currently on it anymore.  Current Outpatient Prescriptions on File Prior to Visit  Medication Sig Dispense Refill  . acetaminophen (TYLENOL) 325 MG tablet Take 2 tablets (650 mg total) by mouth every 6 (six) hours as needed.      . Darunavir Ethanolate (PREZISTA) 800 MG tablet Take 1 tablet (800 mg total) by mouth daily.  30 tablet  11  . dexlansoprazole (DEXILANT) 60 MG capsule Take 60 mg by mouth at bedtime.       . dolutegravir (TIVICAY) 50 MG tablet Take 1 tablet (50 mg total) by mouth daily.  60 tablet  6  . emtricitabine-tenofovir (TRUVADA) 200-300 MG per tablet Take 1 tablet by mouth daily.  30 tablet  11  . metoprolol succinate (TOPROL XL) 50 MG 24 hr tablet Take 1 tablet (50 mg total) by mouth daily. Take with or immediately following a meal.  30 tablet  11  . promethazine (PHENERGAN) 25 MG tablet Take 1 tablet (25 mg total) by mouth every 6 (six) hours as needed for nausea or vomiting.  30 tablet  0  . QUEtiapine (SEROQUEL) 50 MG  tablet Take 2 tablets (100 mg total) by mouth at bedtime.  60 tablet  5  . risperiDONE (RISPERDAL M-TABS) 1 MG disintegrating tablet 1 tablet by mouth each morning 2 tablets each evening  90 tablet  11  . ritonavir (NORVIR) 100 MG TABS tablet Take 1 tablet (100 mg total) by mouth daily.  30 tablet  11  . sertraline (ZOLOFT) 25 MG tablet Take 1 tablet (25 mg total) by mouth daily. Start with taking 1/2 tab per day, increase as directed  30 tablet  6   No current facility-administered medications on file prior to visit.   Active Ambulatory Problems    Diagnosis Date Noted  . Hypotension 01/03/2012  . ARF (acute renal failure) 01/03/2012  . Rash 01/03/2012  . HTN (hypertension) 01/03/2012  . Orthostatic hypotension 05/20/2013  . Chest pain on exertion 05/20/2013  . Weight loss 05/20/2013  . Bipolar disorder 05/20/2013  . GERD (gastroesophageal reflux disease) 05/20/2013  . Malnutrition of moderate degree 05/21/2013  . H/O: substance abuse 06/09/2013  . Anxiety 06/10/2013  . Human immunodeficiency virus (HIV) disease 05/21/2013  . Fever and chills 06/25/2013  . PNA (pneumonia) 06/25/2013  . Homelessness 06/27/2013  . Weakness generalized 06/27/2013  . Abnormal TSH 06/27/2013  . Anemia 06/27/2013   Resolved Ambulatory Problems  Diagnosis Date Noted  . No Resolved Ambulatory Problems   Past Medical History  Diagnosis Date  . Hypertension   . Depressed   . HIV (human immunodeficiency virus infection)        Review of Systems 10 point reviewed, positive pertinents listed above    Objective:   Physical Exam BP 116/75  Pulse 88  Temp(Src) 98.5 F (36.9 C) (Oral)  Wt 178 lb (80.74 kg) Physical Exam  Constitutional: He is oriented to person, place, and time. He appears well-developed and well-nourished. No distress.  HENT:  Mouth/Throat: Oropharynx is clear and moist. No oropharyngeal exudate.  Lymphadenopathy:  He has no cervical adenopathy.  Right axillary abscess  draining with soft compression     Assessment & Plan:  Purulent SSTI = continue with warm compress to keep draining. Will do a short course of doxycycline 100mg  bid x 7 days for presumed mrsa  Anxiety = will prescribe valium but have him follow up with mental health vs. Providers here in [employer]valium 2mg , #30 NR to last 4 wks  hiv = continue with medicaitons, check labs at next visit. Concerned that he has not viral suppression since we are approaching 6 months of being on therapy. He admits to taking medications as prescribed. Will see if achieves viral suppression in the next 1-2 months.

## 2014-01-13 ENCOUNTER — Encounter: Payer: Self-pay | Admitting: Internal Medicine

## 2014-01-13 ENCOUNTER — Ambulatory Visit: Payer: Self-pay | Admitting: *Deleted

## 2014-01-13 ENCOUNTER — Ambulatory Visit (INDEPENDENT_AMBULATORY_CARE_PROVIDER_SITE_OTHER): Payer: Medicaid Other | Admitting: Internal Medicine

## 2014-01-13 VITALS — BP 104/69 | HR 92 | Temp 98.6°F | Ht 69.0 in | Wt 179.0 lb

## 2014-01-13 DIAGNOSIS — L0293 Carbuncle, unspecified: Secondary | ICD-10-CM

## 2014-01-13 DIAGNOSIS — L0292 Furuncle, unspecified: Secondary | ICD-10-CM

## 2014-01-13 MED ORDER — DOXYCYCLINE HYCLATE 100 MG PO TABS: 100.0000 mg | ORAL_TABLET | Freq: Two times a day (BID) | ORAL | Status: DC

## 2014-01-13 NOTE — Progress Notes (Signed)
Subjective:    Patient ID: Ryan [name], male    DOB: 03-18-1971, 43 y.o.   MRN: [id number]  HPI 43yo M with HIV, CD 4 coutn of 230/VL 1903 , currently drv/r/tivicay/truvada. Still has significant anxiety but only taking risperidone 2mg  at bedtime instead of 1 mg in am and 2mg  at bedtime. Has not seen local psychiatry in [facility] as of yet. He suffers from insomnia, and anxiety. Reports taking his hiv medications as directed. His axillary furuncle still has some drainage and tenderness to the area. No fevers  Current Outpatient Prescriptions on File Prior to Visit  Medication Sig Dispense Refill  . acetaminophen (TYLENOL) 325 MG tablet Take 2 tablets (650 mg total) by mouth every 6 (six) hours as needed.      . Darunavir Ethanolate (PREZISTA) 800 MG tablet Take 1 tablet (800 mg total) by mouth daily.  30 tablet  11  . dexlansoprazole (DEXILANT) 60 MG capsule Take 60 mg by mouth at bedtime.       . diazepam (VALIUM) 2 MG tablet Take 1 tablet (2 mg total) by mouth every 12 (twelve) hours as needed for anxiety.  30 tablet  0  . dolutegravir (TIVICAY) 50 MG tablet Take 1 tablet (50 mg total) by mouth daily.  60 tablet  6  . emtricitabine-tenofovir (TRUVADA) 200-300 MG per tablet Take 1 tablet by mouth daily.  30 tablet  11  . metoprolol succinate (TOPROL XL) 50 MG 24 hr tablet Take 1 tablet (50 mg total) by mouth daily. Take with or immediately following a meal.  30 tablet  11  . promethazine (PHENERGAN) 25 MG tablet Take 1 tablet (25 mg total) by mouth every 6 (six) hours as needed for nausea or vomiting.  30 tablet  0  . QUEtiapine (SEROQUEL) 50 MG tablet Take 2 tablets (100 mg total) by mouth at bedtime.  60 tablet  5  . risperiDONE (RISPERDAL M-TABS) 1 MG disintegrating tablet 1 tablet by mouth each morning 2 tablets each evening  90 tablet  11  . ritonavir (NORVIR) 100 MG TABS tablet Take 1 tablet (100 mg total) by mouth daily.  30 tablet  11  . sertraline (ZOLOFT) 25 MG tablet  Take 1 tablet (25 mg total) by mouth daily. Start with taking 1/2 tab per day, increase as directed  30 tablet  6   No current facility-administered medications on file prior to visit.   Active Ambulatory Problems    Diagnosis Date Noted  . Hypotension 01/03/2012  . ARF (acute renal failure) 01/03/2012  . Rash 01/03/2012  . HTN (hypertension) 01/03/2012  . Orthostatic hypotension 05/20/2013  . Chest pain on exertion 05/20/2013  . Weight loss 05/20/2013  . Bipolar disorder 05/20/2013  . GERD (gastroesophageal reflux disease) 05/20/2013  . Malnutrition of moderate degree 05/21/2013  . H/O: substance abuse 06/09/2013  . Anxiety 06/10/2013  . Human immunodeficiency virus (HIV) disease 05/21/2013  . Fever and chills 06/25/2013  . PNA (pneumonia) 06/25/2013  . Homelessness 06/27/2013  . Weakness generalized 06/27/2013  . Abnormal TSH 06/27/2013  . Anemia 06/27/2013   Resolved Ambulatory Problems    Diagnosis Date Noted  . No Resolved Ambulatory Problems   Past Medical History  Diagnosis Date  . Hypertension   . Depressed   . HIV (human immunodeficiency virus infection)       Review of Systems Refer to hpi for 10 point ros, otherwise negative    Objective:   Physical Exam BP 104/69  Pulse 92  Temp(Src) 98.6 F (37 C) (Oral)  Ht 5\' 9"  (1.753 m)  Wt 179 lb (81.194 kg)  BMI 26.42 kg/m2 Physical Exam  Constitutional: He is oriented to person, place, and time. He appears well-developed and well-nourished. No distress.  HENT:  Mouth/Throat: Oropharynx is clear and moist. No oropharyngeal exudate.  Cardiovascular: Normal rate, regular rhythm and normal heart sounds. Exam reveals no gallop and no friction rub.  No murmur heard.  Pulmonary/Chest: Effort normal and breath sounds normal. No respiratory distress. He has no wheezes.  Abdominal: Soft. Bowel sounds are normal. He exhibits no distension. There is no tenderness.  Lymphadenopathy:  He has no cervical adenopathy.    Neurological: He is alert and oriented to person, place, and time.  Skin: Skin is warm and dry. No rash noted. No erythema.  Psychiatric: He has a normal mood and affect. His behavior is normal.  Axilla = right axillary furuncle smaller in size, still indurated        Assessment & Plan:   hiv = will continue on current meds, check labs in 4 wk  Anxiety = inc risperidone to 1mg  in am and continue with 2mg  at bedtime.   Furuncle = do another dose of doxycycline

## 2014-01-13 NOTE — Progress Notes (Signed)
Patient ID: GRADYN SHEIN, male   DOB: 1971/07/26, 43 y.o.   MRN: [id number] Saw this patient at request of Dr. Baxter Flattery since Curley Spice was not available at the time. Kroy presented with flat affect, reporting depression and anxiety, and hoping to experience mood improvement in the next few months so that he can obtain work and be more productive. Was employed in [place] for 20 years, but states he is ready to try something different like [employer].   Pt reports being on Zoloft and Risperdal, Valium - but pt unsure if he is deriving benefit from the meds. He is open to working with his doctor on trying med alternatives. Pt is also open to therapy but states he has difficulty with transportation. Will explore possibility of Medicaid transportation for pt. Made appt for [place] to see this Probation officer at next appt with Dr. Baxter Flattery at [facility], and also for him to see Curley Spice at first available opening on 5/26. Invited pt to contact [facility] counselors for phone support as needed. He reports liking the staff and services at [facility] and feeling cared for. Pt does not feel a need for hospitalization currently. Did express some frustration over living with his mother due to her "fundamentalist" religious beliefs. However, pt intends to maintain his residence with her until financially able to move out on own. Yonael shared having monthly contact with 65 year old son and enjoying their time together. This is an incentive for him to increase his functioning and to participate in [facility] services.   Pt and writer made follow-up appts at [facility].   Tiaja Hagan, [facility]

## 2014-02-10 ENCOUNTER — Ambulatory Visit (INDEPENDENT_AMBULATORY_CARE_PROVIDER_SITE_OTHER): Payer: Medicaid Other | Admitting: Internal Medicine

## 2014-02-10 ENCOUNTER — Encounter: Payer: Self-pay | Admitting: Internal Medicine

## 2014-02-10 ENCOUNTER — Ambulatory Visit: Payer: Self-pay | Admitting: *Deleted

## 2014-02-10 VITALS — BP 116/73 | HR 79 | Temp 98.5°F | Wt 175.0 lb

## 2014-02-10 DIAGNOSIS — Z23 Encounter for immunization: Secondary | ICD-10-CM | POA: Diagnosis not present

## 2014-02-10 DIAGNOSIS — B20 Human immunodeficiency virus [HIV] disease: Secondary | ICD-10-CM | POA: Diagnosis not present

## 2014-02-10 LAB — COMPLETE METABOLIC PANEL WITH GFR
ALT: 12 U/L (ref 0–53)
AST: 13 U/L (ref 0–37)
Albumin: 4.4 g/dL (ref 3.5–5.2)
Alkaline Phosphatase: 79 U/L (ref 39–117)
BUN: 11 mg/dL (ref 6–23)
CALCIUM: 9.5 mg/dL (ref 8.4–10.5)
CO2: 28 meq/L (ref 19–32)
CREATININE: 1.26 mg/dL (ref 0.50–1.35)
Chloride: 98 mEq/L (ref 96–112)
GFR, EST AFRICAN AMERICAN: 80 mL/min
GFR, Est Non African American: 69 mL/min
Glucose, Bld: 99 mg/dL (ref 70–99)
Potassium: 4.3 mEq/L (ref 3.5–5.3)
Sodium: 134 mEq/L — ABNORMAL LOW (ref 135–145)
Total Bilirubin: 0.4 mg/dL (ref 0.2–1.2)
Total Protein: 7.2 g/dL (ref 6.0–8.3)

## 2014-02-10 LAB — CBC WITH DIFFERENTIAL/PLATELET
BASOS ABS: 0 10*3/uL (ref 0.0–0.1)
Basophils Relative: 1 % (ref 0–1)
EOS ABS: 0 10*3/uL (ref 0.0–0.7)
EOS PCT: 1 % (ref 0–5)
HEMATOCRIT: 43.7 % (ref 39.0–52.0)
Hemoglobin: 15.4 g/dL (ref 13.0–17.0)
LYMPHS ABS: 1 10*3/uL (ref 0.7–4.0)
LYMPHS PCT: 21 % (ref 12–46)
MCH: 31.2 pg (ref 26.0–34.0)
MCHC: 35.2 g/dL (ref 30.0–36.0)
MCV: 88.5 fL (ref 78.0–100.0)
MONO ABS: 0.4 10*3/uL (ref 0.1–1.0)
Monocytes Relative: 9 % (ref 3–12)
Neutro Abs: 3.2 10*3/uL (ref 1.7–7.7)
Neutrophils Relative %: 68 % (ref 43–77)
Platelets: 198 10*3/uL (ref 150–400)
RBC: 4.94 MIL/uL (ref 4.22–5.81)
RDW: 14.5 % (ref 11.5–15.5)
WBC: 4.7 10*3/uL (ref 4.0–10.5)

## 2014-02-10 NOTE — Progress Notes (Signed)
Subjective:    Patient ID: Ryan [name], male    DOB: 11/03/1970, 43 y.o.   MRN: [id number]  HPI 43yo M with HIV, Cd 4 count of 230/VL 1901, on DLG/truvada/DRVr. He reports taking it daily. Has been on ART since September. Still unable to sleep .seroquel not useful. Takes risperidone 1mg  in am and 2mg  in pm. [place]. Has decreased appetite.  Current Outpatient Prescriptions on File Prior to Visit  Medication Sig Dispense Refill  . acetaminophen (TYLENOL) 325 MG tablet Take 2 tablets (650 mg total) by mouth every 6 (six) hours as needed.      . Darunavir Ethanolate (PREZISTA) 800 MG tablet Take 1 tablet (800 mg total) by mouth daily.  30 tablet  11  . dexlansoprazole (DEXILANT) 60 MG capsule Take 60 mg by mouth at bedtime.       . diazepam (VALIUM) 2 MG tablet Take 1 tablet (2 mg total) by mouth every 12 (twelve) hours as needed for anxiety.  30 tablet  0  . dolutegravir (TIVICAY) 50 MG tablet Take 1 tablet (50 mg total) by mouth daily.  60 tablet  6  . emtricitabine-tenofovir (TRUVADA) 200-300 MG per tablet Take 1 tablet by mouth daily.  30 tablet  11  . metoprolol succinate (TOPROL XL) 50 MG 24 hr tablet Take 1 tablet (50 mg total) by mouth daily. Take with or immediately following a meal.  30 tablet  11  . QUEtiapine (SEROQUEL) 50 MG tablet Take 2 tablets (100 mg total) by mouth at bedtime.  60 tablet  5  . risperiDONE (RISPERDAL M-TABS) 1 MG disintegrating tablet 1 tablet by mouth each morning 2 tablets each evening  90 tablet  11  . ritonavir (NORVIR) 100 MG TABS tablet Take 1 tablet (100 mg total) by mouth daily.  30 tablet  11  . sertraline (ZOLOFT) 25 MG tablet Take 1 tablet (25 mg total) by mouth daily. Start with taking 1/2 tab per day, increase as directed  30 tablet  6  . doxycycline (VIBRA-TABS) 100 MG tablet Take 1 tablet (100 mg total) by mouth 2 (two) times daily.  20 tablet  0  . promethazine (PHENERGAN) 25 MG tablet Take 1 tablet (25 mg total)  by mouth every 6 (six) hours as needed for nausea or vomiting.  30 tablet  0   No current facility-administered medications on file prior to visit.   Active Ambulatory Problems    Diagnosis Date Noted  . Hypotension 01/03/2012  . ARF (acute renal failure) 01/03/2012  . Rash 01/03/2012  . HTN (hypertension) 01/03/2012  . Orthostatic hypotension 05/20/2013  . Chest pain on exertion 05/20/2013  . Weight loss 05/20/2013  . Bipolar disorder 05/20/2013  . GERD (gastroesophageal reflux disease) 05/20/2013  . Malnutrition of moderate degree 05/21/2013  . H/O: substance abuse 06/09/2013  . Anxiety 06/10/2013  . Human immunodeficiency virus (HIV) disease 05/21/2013  . Fever and chills 06/25/2013  . PNA (pneumonia) 06/25/2013  . Homelessness 06/27/2013  . Weakness generalized 06/27/2013  . Abnormal TSH 06/27/2013  . Anemia 06/27/2013   Resolved Ambulatory Problems    Diagnosis Date Noted  . No Resolved Ambulatory Problems   Past Medical History  Diagnosis Date  . Hypertension   . Depressed   . HIV (human immunodeficiency virus infection)       Review of Systems     Objective:   Physical Exam BP 116/73  Pulse 79  Temp(Src) 98.5 F (36.9 C) (  Oral)  Wt 175 lb (79.379 kg) Physical Exam  Constitutional: He is oriented to person, place, and time. He appears well-developed and well-nourished. No distress.  HENT: facial wasting Mouth/Throat: Oropharynx is clear and moist. No oropharyngeal exudate.  Cardiovascular: Normal rate, regular rhythm and normal heart sounds. Exam reveals no gallop and no friction rub.  No murmur heard.  Pulmonary/Chest: Effort normal and breath sounds normal. No respiratory distress. He has no wheezes.  Abdominal: Soft. Bowel sounds are normal. He exhibits no distension. There is no tenderness.  Lymphadenopathy:  He has no cervical adenopathy.  Neurological: He is alert and oriented to person, place, and time.  Skin: Skin is warm and dry. No rash  noted. Left axilla has small ulcer from previous furuncle that is still slowly healing 1-56mm deep, 1 cm long.  Psychiatric: He has a normal mood and affect. His behavior is normal.         Assessment & Plan:  hiv = continue on current regimen. Will get hiv with genotype nad integrase inhibitor today, concern that he is not virally suppressed   Health maintenance = will give hep #2 off schedule. May need to restart seriwes  Bipolar with depresison = poorly controlled, still not reconnected with psychiatry. continue with risperidone. And see max today  Skin lesion = continue with wet to dry dressing daily  rtc in 4 wk

## 2014-02-11 LAB — T-HELPER CELL (CD4) - (RCID CLINIC ONLY)
CD4 T CELL HELPER: 21 % — AB (ref 33–55)
CD4 T Cell Abs: 230 /uL — ABNORMAL LOW (ref 400–2700)

## 2014-02-12 LAB — HIV-1 RNA ULTRAQUANT REFLEX TO GENTYP+
HIV 1 RNA QUANT: 1926 {copies}/mL — AB (ref <20)
HIV-1 RNA QUANT, LOG: 3.28 {Log} — AB (ref <1.30)

## 2014-02-20 LAB — HIV-1 INTEGRASE GENOTYPE

## 2014-03-01 ENCOUNTER — Ambulatory Visit: Payer: Self-pay

## 2014-03-01 ENCOUNTER — Ambulatory Visit: Payer: Self-pay | Admitting: Internal Medicine

## 2014-03-02 LAB — HIV-1 GENOTYPR PLUS

## 2014-03-03 ENCOUNTER — Encounter: Payer: Self-pay | Admitting: Internal Medicine

## 2014-03-03 ENCOUNTER — Telehealth: Payer: Self-pay | Admitting: *Deleted

## 2014-03-03 NOTE — Telephone Encounter (Signed)
Please see the patient's email: Landis Gandy, RN  _______________________________ Can I take Benadryl? I currently am experiencing hives. Call at [phone] Cell or [phone] Home. Marjory Lies.

## 2014-03-03 NOTE — Telephone Encounter (Signed)
He can take benadryl, but the bigger question is why does he have hives? Any new meds?

## 2014-03-04 NOTE — Telephone Encounter (Signed)
Pt reports happen weekly, last about 1 day.  Has been happening for the last 2 weeks.  Pt denies any changes in medication, detergents, soaps or anything that touches his skin.  Per Dr. Baxter Flattery, benadryl is fine.  Pt needs to let us know if the hives last longer than 2 days, increase in frequency, or location.  Pt notified, agrees. Landis Gandy, RN

## 2014-04-07 ENCOUNTER — Encounter: Payer: Self-pay | Admitting: Internal Medicine

## 2014-04-07 ENCOUNTER — Ambulatory Visit (INDEPENDENT_AMBULATORY_CARE_PROVIDER_SITE_OTHER): Payer: Medicaid Other | Admitting: Internal Medicine

## 2014-04-07 VITALS — BP 126/80 | HR 84 | Temp 98.1°F | Ht 70.0 in | Wt 186.0 lb

## 2014-04-07 DIAGNOSIS — B20 Human immunodeficiency virus [HIV] disease: Secondary | ICD-10-CM

## 2014-04-07 NOTE — Progress Notes (Signed)
Subjective:    Patient ID: Ryan [name], male    DOB: 1971-07-30, 43 y.o.   MRN: [id number]  HPI Ryan [name] is a 43yo M with hiv and bipolar disease, CD 4 count of 230/VL 1901, DLG/truvada/DRVr he comes to clinic for follow up. Most recently he had stopped all his meds then restarted taking his hiv meds for the past 2 weeks. He is currently not taking any of his psych medications. He feels that he is not sleeping due to have numerous thoughts in his mind. He also feels angry. He has not sought care through any mental health providers. He denies having plans to hurt anyone. He reports being angry at his mother who he lives with. He has insight that if he were to harm anyone that he would lose his relationship with his son.  Allergies  Allergen Reactions  . Levaquin [Levofloxacin In D5w] Other (See Comments)    Pain & nerve damage in fingers  . Sulfa Antibiotics Rash   Current Outpatient Prescriptions on File Prior to Visit  Medication Sig Dispense Refill  . acetaminophen (TYLENOL) 325 MG tablet Take 2 tablets (650 mg total) by mouth every 6 (six) hours as needed.      . Darunavir Ethanolate (PREZISTA) 800 MG tablet Take 1 tablet (800 mg total) by mouth daily.  30 tablet  11  . dexlansoprazole (DEXILANT) 60 MG capsule Take 60 mg by mouth at bedtime.       . diazepam (VALIUM) 2 MG tablet Take 1 tablet (2 mg total) by mouth every 12 (twelve) hours as needed for anxiety.  30 tablet  0  . dolutegravir (TIVICAY) 50 MG tablet Take 1 tablet (50 mg total) by mouth daily.  60 tablet  6  . doxycycline (VIBRA-TABS) 100 MG tablet Take 1 tablet (100 mg total) by mouth 2 (two) times daily.  20 tablet  0  . emtricitabine-tenofovir (TRUVADA) 200-300 MG per tablet Take 1 tablet by mouth daily.  30 tablet  11  . metoprolol succinate (TOPROL XL) 50 MG 24 hr tablet Take 1 tablet (50 mg total) by mouth daily. Take with or immediately following a meal.  30 tablet  11  . promethazine (PHENERGAN) 25 MG tablet Take 1  tablet (25 mg total) by mouth every 6 (six) hours as needed for nausea or vomiting.  30 tablet  0  . QUEtiapine (SEROQUEL) 50 MG tablet Take 2 tablets (100 mg total) by mouth at bedtime.  60 tablet  5  . risperiDONE (RISPERDAL M-TABS) 1 MG disintegrating tablet 1 tablet by mouth each morning 2 tablets each evening  90 tablet  11  . ritonavir (NORVIR) 100 MG TABS tablet Take 1 tablet (100 mg total) by mouth daily.  30 tablet  11  . sertraline (ZOLOFT) 25 MG tablet Take 1 tablet (25 mg total) by mouth daily. Start with taking 1/2 tab per day, increase as directed  30 tablet  6   No current facility-administered medications on file prior to visit.   Active Ambulatory Problems    Diagnosis Date Noted  . Hypotension 01/03/2012  . ARF (acute renal failure) 01/03/2012  . Rash 01/03/2012  . HTN (hypertension) 01/03/2012  . Orthostatic hypotension 05/20/2013  . Chest pain on exertion 05/20/2013  . Weight loss 05/20/2013  . Bipolar disorder 05/20/2013  . GERD (gastroesophageal reflux disease) 05/20/2013  . Malnutrition of moderate degree 05/21/2013  . H/O: substance abuse 06/09/2013  . Anxiety 06/10/2013  . Human immunodeficiency virus (HIV)  disease 05/21/2013  . Fever and chills 06/25/2013  . PNA (pneumonia) 06/25/2013  . Homelessness 06/27/2013  . Weakness generalized 06/27/2013  . Abnormal TSH 06/27/2013  . Anemia 06/27/2013   Resolved Ambulatory Problems    Diagnosis Date Noted  . No Resolved Ambulatory Problems   Past Medical History  Diagnosis Date  . Hypertension   . Depressed   . HIV (human immunodeficiency virus infection)       Review of Systems Scant drainage fron right axilla. + depression. +anxiety. 12point ros is otherwise negative    Objective:   Physical Exam  BP 126/80  Pulse 84  Temp(Src) 98.1 F (36.7 C) (Oral)  Ht 5\' 10"  (1.778 m)  Wt 186 lb (84.369 kg)  BMI 26.69 kg/m2 Physical Exam  Constitutional:  oriented to person, place, and time. appears  well-developed and well-nourished. No distress.  HENT:  Mouth/Throat: Oropharynx is clear and moist. No oropharyngeal exudate.  Cardiovascular: Normal rate, regular rhythm and normal heart sounds. Exam reveals no gallop and no friction rub.  No murmur heard.  Pulmonary/Chest: Effort normal and breath sounds normal. No respiratory distress.  has no wheezes.  Abdominal: Soft. Bowel sounds are normal.  exhibits no distension. There is no tenderness.  Lymphadenopathy: no cervical adenopathy.  Neurological: alert and oriented to person, place, and time.  Skin: Skin is warm and dry. No rash noted. No erythema.  Psychiatric: flat affect, does not appear agitated. Able to look into your eyes when answering questions.       Assessment & Plan:  bipolar disease /mental illness= asked him to start back on risperidone 1mg  in am and 2 mg in pm. i strongly urged he seek psychiatric care. We gave him contacts again. He reports not going to act on his feelings. We discuss that this insight is essential and that he can improve by seeking care and starting medicines. Used his goal of having a good relationship with his son as his main motivation to improve. Will call back early next week to see how he is doing  hiv = poorly controlled, especially with recent break in taking art  Right axillary draining sinus= nearly healed. No need for further antibiotics  rtc in 2-3 wk

## 2014-04-11 ENCOUNTER — Telehealth: Payer: Self-pay | Admitting: Licensed Clinical Social Worker

## 2014-04-11 ENCOUNTER — Encounter: Payer: Self-pay | Admitting: Internal Medicine

## 2014-04-11 NOTE — Telephone Encounter (Signed)
Patient was seen last week for f/u visit. Patient's sister agrees that she is worried about his mental status and he has verbally threatened to harm his Mother to his sister Ryan [name] and also against himself. Sister wanted to know how to handle this situation, I suggested that she has him involuntarily committed and for her to call 911 or to go the [facility] office to sign paperwork to have him committed. Patient's sister sounded terrified of the fact that he could get out without being fully treated and harm them in retaliation. I advised her that she needed to do it for her Mother's safety as well as the patients. He is currently off his medications, per the sister he is taking unprescribed medications off the street. She wanted Dr. Baxter Flattery to know all of this in hopes that she would have other optiions.

## 2014-04-11 NOTE — Telephone Encounter (Signed)
Tamika, i agree with your response. I do not think there is any alternative, and if they think that their mother is at harm, they need to contact 9-1-1. Please call her in the morning to let them know that he needs to be involuntary committed.

## 2014-04-11 NOTE — Telephone Encounter (Signed)
Left voicemail with his sister, Ryan [name], to also call 9-1-1 if they have concern for safety.

## 2014-04-12 NOTE — Telephone Encounter (Signed)
Patient's sister notified and she stated that she talked to Dr. Baxter Flattery yesterday evening and was given these instructions. Ryan [name]

## 2014-04-13 ENCOUNTER — Telehealth: Payer: Self-pay | Admitting: *Deleted

## 2014-04-13 LAB — HIV-1 RNA ULTRAQUANT REFLEX TO GENTYP+
HIV 1 RNA QUANT: 215 {copies}/mL — AB (ref <20)
HIV-1 RNA QUANT, LOG: 2.33 {Log} — AB (ref <1.30)

## 2014-04-13 NOTE — Telephone Encounter (Signed)
Patient requested a referral to Dr. Ready at [facility].  RN called, spoke with Jeani Hawking.  Per Jeani Hawking, they will call patient and complete the intake process.  Patient will have to have a $150 deposit (to be used on copays/deductibles).  They will contact patient, given him this information, see if he is still interested in proceeding with the appointment. Landis Gandy, RN

## 2014-04-14 NOTE — Telephone Encounter (Signed)
Thanks. i left vm on sister's phone that if they are really worried about safety, that they need to call 911

## 2014-05-10 ENCOUNTER — Ambulatory Visit: Payer: Self-pay

## 2014-05-10 ENCOUNTER — Ambulatory Visit: Payer: Self-pay | Admitting: Internal Medicine

## 2014-05-25 ENCOUNTER — Ambulatory Visit: Payer: Self-pay

## 2014-06-06 ENCOUNTER — Ambulatory Visit: Payer: Self-pay

## 2014-06-06 ENCOUNTER — Ambulatory Visit: Payer: Self-pay | Admitting: Internal Medicine

## 2014-06-20 ENCOUNTER — Ambulatory Visit: Payer: Self-pay

## 2014-06-20 ENCOUNTER — Ambulatory Visit: Payer: Self-pay | Admitting: Internal Medicine

## 2014-06-22 ENCOUNTER — Other Ambulatory Visit: Payer: Self-pay | Admitting: *Deleted

## 2014-06-22 DIAGNOSIS — B20 Human immunodeficiency virus [HIV] disease: Secondary | ICD-10-CM

## 2014-06-22 DIAGNOSIS — Z113 Encounter for screening for infections with a predominantly sexual mode of transmission: Secondary | ICD-10-CM

## 2014-07-04 ENCOUNTER — Encounter: Payer: Self-pay | Admitting: Internal Medicine

## 2014-07-04 ENCOUNTER — Ambulatory Visit (INDEPENDENT_AMBULATORY_CARE_PROVIDER_SITE_OTHER): Payer: Medicaid Other | Admitting: Internal Medicine

## 2014-07-04 VITALS — BP 121/78 | HR 118 | Temp 97.3°F | Wt 188.0 lb

## 2014-07-04 DIAGNOSIS — Z23 Encounter for immunization: Secondary | ICD-10-CM

## 2014-07-04 DIAGNOSIS — F329 Major depressive disorder, single episode, unspecified: Secondary | ICD-10-CM

## 2014-07-04 DIAGNOSIS — Z21 Asymptomatic human immunodeficiency virus [HIV] infection status: Secondary | ICD-10-CM

## 2014-07-04 DIAGNOSIS — L0292 Furuncle, unspecified: Secondary | ICD-10-CM

## 2014-07-04 DIAGNOSIS — F32A Depression, unspecified: Secondary | ICD-10-CM

## 2014-07-04 DIAGNOSIS — F3289 Other specified depressive episodes: Secondary | ICD-10-CM

## 2014-07-04 DIAGNOSIS — L0293 Carbuncle, unspecified: Secondary | ICD-10-CM

## 2014-07-04 MED ORDER — CITALOPRAM HYDROBROMIDE 20 MG PO TABS: 20.0000 mg | ORAL_TABLET | Freq: Every day | ORAL | Status: DC

## 2014-07-04 NOTE — Progress Notes (Signed)
Patient ID: Ryan [name], male   DOB: [birth year], 43 y.o.   MRN: [id number]       Patient ID: Ryan [name], male   DOB: 01/23/1971, 43 y.o.   MRN: [id number]  HPI Ryan [name] is a 43yo M with hiv and bipolar disease, CD 4 count of 230/VL 215 (july), DLG/truvada/DRVr he comes to clinic for follow up. Right axilla just started draining again.   Outpatient Encounter Prescriptions as of 07/04/2014  Medication Sig  . Darunavir Ethanolate (PREZISTA) 800 MG tablet Take 1 tablet (800 mg total) by mouth daily.  Marland Kitchen dexlansoprazole (DEXILANT) 60 MG capsule Take 60 mg by mouth at bedtime.   . dolutegravir (TIVICAY) 50 MG tablet Take 1 tablet (50 mg total) by mouth daily.  Marland Kitchen emtricitabine-tenofovir (TRUVADA) 200-300 MG per tablet Take 1 tablet by mouth daily.  . metoprolol succinate (TOPROL XL) 50 MG 24 hr tablet Take 1 tablet (50 mg total) by mouth daily. Take with or immediately following a meal.  . QUEtiapine (SEROQUEL XR) 400 MG 24 hr tablet Take 800 mg by mouth at bedtime.  . ritonavir (NORVIR) 100 MG TABS tablet Take 1 tablet (100 mg total) by mouth daily.  Marland Kitchen acetaminophen (TYLENOL) 325 MG tablet Take 2 tablets (650 mg total) by mouth every 6 (six) hours as needed.  . doxycycline (VIBRA-TABS) 100 MG tablet Take 1 tablet (100 mg total) by mouth 2 (two) times daily.  . promethazine (PHENERGAN) 25 MG tablet Take 1 tablet (25 mg total) by mouth every 6 (six) hours as needed for nausea or vomiting.  . [DISCONTINUED] diazepam (VALIUM) 2 MG tablet Take 1 tablet (2 mg total) by mouth every 12 (twelve) hours as needed for anxiety.  . [DISCONTINUED] QUEtiapine (SEROQUEL) 50 MG tablet Take 2 tablets (100 mg total) by mouth at bedtime.  . [DISCONTINUED] risperiDONE (RISPERDAL M-TABS) 1 MG disintegrating tablet 1 tablet by mouth each morning 2 tablets each evening  . [DISCONTINUED] sertraline (ZOLOFT) 25 MG tablet Take 1 tablet (25 mg total) by mouth daily. Start with taking 1/2 tab per day, increase as directed       Patient Active Problem List   Diagnosis Date Noted  . Homelessness 06/27/2013  . Weakness generalized 06/27/2013  . Abnormal TSH 06/27/2013  . Anemia 06/27/2013  . Fever and chills 06/25/2013  . PNA (pneumonia) 06/25/2013  . Anxiety 06/10/2013  . H/O: substance abuse 06/09/2013  . Malnutrition of moderate degree 05/21/2013  . Human immunodeficiency virus (HIV) disease 05/21/2013  . Orthostatic hypotension 05/20/2013  . Chest pain on exertion 05/20/2013  . Weight loss 05/20/2013  . Bipolar disorder 05/20/2013  . GERD (gastroesophageal reflux disease) 05/20/2013  . Hypotension 01/03/2012  . ARF (acute renal failure) 01/03/2012  . Rash 01/03/2012  . HTN (hypertension) 01/03/2012     Health Maintenance Due  Topic Date Due  . Tetanus/tdap  11/21/1989  . Influenza Vaccine  05/07/2014     Review of Systems  Physical Exam   BP 121/78  Pulse 118  Temp(Src) 97.3 F (36.3 C) (Oral)  Wt 188 lb (85.276 kg)  Lab Results  Component Value Date   CD4TCELL 21* 02/10/2014   Lab Results  Component Value Date   CD4TABS 230* 02/10/2014   CD4TABS 230* 10/29/2013   CD4TABS 290* 10/18/2013   Lab Results  Component Value Date   HIV1RNAQUANT 215* 04/07/2014   Lab Results  Component Value Date   HEPBSAB NEG 06/03/2013   No results found for this basename: RPR  CBC Lab Results  Component Value Date   WBC 4.7 02/10/2014   RBC 4.94 02/10/2014   HGB 15.4 02/10/2014   HCT 43.7 02/10/2014   PLT 198 02/10/2014   MCV 88.5 02/10/2014   MCH 31.2 02/10/2014   MCHC 35.2 02/10/2014   RDW 14.5 02/10/2014   LYMPHSABS 1.0 02/10/2014   MONOABS 0.4 02/10/2014   EOSABS 0.0 02/10/2014   BASOSABS 0.0 02/10/2014   BMET Lab Results  Component Value Date   NA 134* 02/10/2014   K 4.3 02/10/2014   CL 98 02/10/2014   CO2 28 02/10/2014   GLUCOSE 99 02/10/2014   BUN 11 02/10/2014   CREATININE 1.26 02/10/2014   CALCIUM 9.5 02/10/2014   GFRNONAA 69 02/10/2014   GFRAA 80 02/10/2014     Assessment and Plan    hiv = will check  labs, doing well with adherence.  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 Needs

## 2014-07-04 NOTE — Addendum Note (Signed)
Addended by: Reggy Eye on: 07/04/2014 04:41 PM   Modules accepted: Orders

## 2014-07-04 NOTE — Addendum Note (Signed)
Addended by: Reggy Eye on: 07/04/2014 04:08 PM   Modules accepted: Orders

## 2014-07-05 LAB — CBC WITH DIFFERENTIAL/PLATELET
Basophils Absolute: 0.1 10*3/uL (ref 0.0–0.1)
Basophils Relative: 1 % (ref 0–1)
EOS PCT: 2 % (ref 0–5)
Eosinophils Absolute: 0.1 10*3/uL (ref 0.0–0.7)
HEMATOCRIT: 43.1 % (ref 39.0–52.0)
Hemoglobin: 15.3 g/dL (ref 13.0–17.0)
LYMPHS PCT: 34 % (ref 12–46)
Lymphs Abs: 1.8 10*3/uL (ref 0.7–4.0)
MCH: 32.5 pg (ref 26.0–34.0)
MCHC: 35.5 g/dL (ref 30.0–36.0)
MCV: 91.5 fL (ref 78.0–100.0)
Monocytes Absolute: 0.5 10*3/uL (ref 0.1–1.0)
Monocytes Relative: 10 % (ref 3–12)
NEUTROS ABS: 2.8 10*3/uL (ref 1.7–7.7)
Neutrophils Relative %: 53 % (ref 43–77)
Platelets: 190 10*3/uL (ref 150–400)
RBC: 4.71 MIL/uL (ref 4.22–5.81)
RDW: 14.7 % (ref 11.5–15.5)
WBC: 5.2 10*3/uL (ref 4.0–10.5)

## 2014-07-05 LAB — COMPLETE METABOLIC PANEL WITH GFR
ALBUMIN: 4.1 g/dL (ref 3.5–5.2)
ALT: 12 U/L (ref 0–53)
AST: 13 U/L (ref 0–37)
Alkaline Phosphatase: 83 U/L (ref 39–117)
BUN: 10 mg/dL (ref 6–23)
CALCIUM: 9.1 mg/dL (ref 8.4–10.5)
CHLORIDE: 104 meq/L (ref 96–112)
CO2: 26 mEq/L (ref 19–32)
CREATININE: 1.22 mg/dL (ref 0.50–1.35)
GFR, Est African American: 83 mL/min
GFR, Est Non African American: 72 mL/min
Glucose, Bld: 68 mg/dL — ABNORMAL LOW (ref 70–99)
POTASSIUM: 4 meq/L (ref 3.5–5.3)
Sodium: 137 mEq/L (ref 135–145)
Total Bilirubin: 0.4 mg/dL (ref 0.2–1.2)
Total Protein: 6.7 g/dL (ref 6.0–8.3)

## 2014-07-05 LAB — T-HELPER CELL (CD4) - (RCID CLINIC ONLY)
CD4 % Helper T Cell: 18 % — ABNORMAL LOW (ref 33–55)
CD4 T Cell Abs: 330 /uL — ABNORMAL LOW (ref 400–2700)

## 2014-07-06 LAB — HIV-1 RNA QUANT-NO REFLEX-BLD
HIV 1 RNA Quant: 32 copies/mL — ABNORMAL HIGH (ref <20)
HIV-1 RNA Quant, Log: 1.51 {Log} — ABNORMAL HIGH (ref <1.30)

## 2014-07-07 LAB — WOUND CULTURE
GRAM STAIN: NONE SEEN
GRAM STAIN: NONE SEEN
Gram Stain: NONE SEEN

## 2014-08-16 LAB — AFB CULTURE WITH SMEAR (NOT AT ARMC): Acid Fast Smear: NONE SEEN

## 2014-10-10 ENCOUNTER — Ambulatory Visit (INDEPENDENT_AMBULATORY_CARE_PROVIDER_SITE_OTHER): Payer: Medicaid Other | Admitting: Internal Medicine

## 2014-10-10 ENCOUNTER — Encounter: Payer: Self-pay | Admitting: Internal Medicine

## 2014-10-10 VITALS — BP 113/75 | HR 87 | Temp 98.7°F | Wt 203.0 lb

## 2014-10-10 DIAGNOSIS — R635 Abnormal weight gain: Secondary | ICD-10-CM

## 2014-10-10 DIAGNOSIS — Z79899 Other long term (current) drug therapy: Secondary | ICD-10-CM

## 2014-10-10 DIAGNOSIS — Z113 Encounter for screening for infections with a predominantly sexual mode of transmission: Secondary | ICD-10-CM

## 2014-10-10 DIAGNOSIS — F3162 Bipolar disorder, current episode mixed, moderate: Secondary | ICD-10-CM

## 2014-10-10 DIAGNOSIS — Z21 Asymptomatic human immunodeficiency virus [HIV] infection status: Secondary | ICD-10-CM

## 2014-10-10 LAB — COMPLETE METABOLIC PANEL WITH GFR
ALK PHOS: 76 U/L (ref 39–117)
ALT: 14 U/L (ref 0–53)
AST: 18 U/L (ref 0–37)
Albumin: 4.2 g/dL (ref 3.5–5.2)
BUN: 8 mg/dL (ref 6–23)
CO2: 27 mEq/L (ref 19–32)
Calcium: 9.4 mg/dL (ref 8.4–10.5)
Chloride: 101 mEq/L (ref 96–112)
Creat: 1.23 mg/dL (ref 0.50–1.35)
GFR, EST NON AFRICAN AMERICAN: 71 mL/min
GFR, Est African American: 83 mL/min
Glucose, Bld: 84 mg/dL (ref 70–99)
Potassium: 4.5 mEq/L (ref 3.5–5.3)
Sodium: 138 mEq/L (ref 135–145)
TOTAL PROTEIN: 7 g/dL (ref 6.0–8.3)
Total Bilirubin: 0.4 mg/dL (ref 0.2–1.2)

## 2014-10-10 LAB — LIPID PANEL
CHOLESTEROL: 227 mg/dL — AB (ref 0–200)
HDL: 47 mg/dL (ref >39)
LDL Cholesterol: 113 mg/dL — ABNORMAL HIGH (ref 0–99)
Total CHOL/HDL Ratio: 4.8 Ratio
Triglycerides: 333 mg/dL — ABNORMAL HIGH (ref <150)
VLDL: 67 mg/dL — ABNORMAL HIGH (ref 0–40)

## 2014-10-10 LAB — CBC WITH DIFFERENTIAL/PLATELET
BASOS ABS: 0 10*3/uL (ref 0.0–0.1)
BASOS PCT: 0 % (ref 0–1)
Eosinophils Absolute: 0 10*3/uL (ref 0.0–0.7)
Eosinophils Relative: 1 % (ref 0–5)
HCT: 45.7 % (ref 39.0–52.0)
Hemoglobin: 16.1 g/dL (ref 13.0–17.0)
Lymphocytes Relative: 22 % (ref 12–46)
Lymphs Abs: 1 10*3/uL (ref 0.7–4.0)
MCH: 32.7 pg (ref 26.0–34.0)
MCHC: 35.2 g/dL (ref 30.0–36.0)
MCV: 92.7 fL (ref 78.0–100.0)
MONO ABS: 0.5 10*3/uL (ref 0.1–1.0)
MONOS PCT: 10 % (ref 3–12)
MPV: 11 fL (ref 8.6–12.4)
NEUTROS PCT: 67 % (ref 43–77)
Neutro Abs: 3.1 10*3/uL (ref 1.7–7.7)
Platelets: 204 10*3/uL (ref 150–400)
RBC: 4.93 MIL/uL (ref 4.22–5.81)
RDW: 14.1 % (ref 11.5–15.5)
WBC: 4.7 10*3/uL (ref 4.0–10.5)

## 2014-10-10 MED ORDER — ELVITEG-COBIC-EMTRICIT-TENOFDF 150-150-200-300 MG PO TABS: 1.0000 | ORAL_TABLET | Freq: Every day | ORAL | Status: DC

## 2014-10-10 NOTE — Progress Notes (Signed)
Patient ID: Ryan [name], male   DOB: 07-20-1971, 44 y.o.   MRN: [id number]       Patient ID: Ryan [name], male   DOB: 1971/09/13, 44 y.o.   MRN: [id number]  HPI 44yo M with bipolar disease (now well controlled) and hiv disease, well controlled, CD 4 count of 330/VL 32,in sept 2015. Currently on tivicay/truvada/DRVr. 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 Noticing some weight gain in the process.  Outpatient Encounter Prescriptions as of 10/10/2014  Medication Sig  . acetaminophen (TYLENOL) 325 MG tablet Take 2 tablets (650 mg total) by mouth every 6 (six) hours as needed.  . citalopram (CELEXA) 20 MG tablet Take 1 tablet (20 mg total) by mouth daily.  . Darunavir Ethanolate (PREZISTA) 800 MG tablet Take 1 tablet (800 mg total) by mouth daily.  Marland Kitchen dexlansoprazole (DEXILANT) 60 MG capsule Take 60 mg by mouth at bedtime.   . dolutegravir (TIVICAY) 50 MG tablet Take 1 tablet (50 mg total) by mouth daily.  Marland Kitchen emtricitabine-tenofovir (TRUVADA) 200-300 MG per tablet Take 1 tablet by mouth daily.  . metoprolol succinate (TOPROL XL) 50 MG 24 hr tablet Take 1 tablet (50 mg total) by mouth daily. Take with or immediately following a meal.  . promethazine (PHENERGAN) 25 MG tablet Take 1 tablet (25 mg total) by mouth every 6 (six) hours as needed for nausea or vomiting.  Marland Kitchen QUEtiapine (SEROQUEL XR) 400 MG 24 hr tablet Take 800 mg by mouth at bedtime.  . ritonavir (NORVIR) 100 MG TABS tablet Take 1 tablet (100 mg total) by mouth daily.  . [DISCONTINUED] doxycycline (VIBRA-TABS) 100 MG tablet Take 1 tablet (100 mg total) by mouth 2 (two) times daily.     Patient Active Problem List   Diagnosis Date Noted  . Homelessness 06/27/2013  . Weakness generalized 06/27/2013  . Abnormal TSH 06/27/2013  . Anemia 06/27/2013  . Fever and chills 06/25/2013  . PNA (pneumonia) 06/25/2013  . Anxiety 06/10/2013  . H/O: substance abuse 06/09/2013  . Malnutrition of moderate degree 05/21/2013  .  Human immunodeficiency virus (HIV) disease 05/21/2013  . Orthostatic hypotension 05/20/2013  . Chest pain on exertion 05/20/2013  . Weight loss 05/20/2013  . Bipolar disorder 05/20/2013  . GERD (gastroesophageal reflux disease) 05/20/2013  . Hypotension 01/03/2012  . ARF (acute renal failure) 01/03/2012  . Rash 01/03/2012  . HTN (hypertension) 01/03/2012     Health Maintenance Due  Topic Date Due  . TETANUS/TDAP  11/21/1989     Review of Systems Review of Systems  Constitutional: Negative for fever, chills, diaphoresis, activity change, appetite change, fatigue and + unexpected weight gain HENT: Negative for congestion, sore throat, rhinorrhea, sneezing, trouble swallowing and sinus pressure.  Eyes: Negative for photophobia and visual disturbance.  Respiratory: Negative for cough, chest tightness, shortness of breath, wheezing and stridor.  Cardiovascular: Negative for chest pain, palpitations and leg swelling.  Gastrointestinal: Negative for nausea, vomiting, abdominal pain, diarrhea, constipation, blood in stool, abdominal distention and anal bleeding.  Genitourinary: Negative for dysuria, hematuria, flank pain and difficulty urinating.  Musculoskeletal: Negative for myalgias, back pain, joint swelling, arthralgias and gait problem.  Skin: Negative for color change, pallor, rash and wound.  Neurological: Negative for dizziness, tremors, weakness and light-headedness.  Hematological: Negative for adenopathy. Does not bruise/bleed easily.  Psychiatric/Behavioral: Negative for behavioral problems, confusion, sleep disturbance, dysphoric mood, decreased concentration and agitation.    Physical Exam   BP 113/75 mmHg  Pulse 87  Temp(Src) 98.7 F (37.1 C) (Oral)  Wt 203 lb (92.08 kg) Physical Exam  Constitutional: He is oriented to person, place, and time. He appears well-developed and well-nourished. No distress.  HENT:  Mouth/Throat: Oropharynx is clear and moist. No  oropharyngeal exudate.  Cardiovascular: Normal rate, regular rhythm and normal heart sounds. Exam reveals no gallop and no friction rub.  No murmur heard.  Pulmonary/Chest: Effort normal and breath sounds normal. No respiratory distress. He has no wheezes.  Abdominal: Soft. Bowel sounds are normal. He exhibits no distension. There is no tenderness.  Lymphadenopathy:  He has no cervical adenopathy.  Neurological: He is alert and oriented to person, place, and time.  Skin: Skin is warm and dry. No rash noted. No erythema.  Psychiatric: He has a normal mood and affect. His behavior is normal.    Lab Results  Component Value Date   CD4TCELL 18* 07/04/2014   Lab Results  Component Value Date   CD4TABS 330* 07/04/2014   CD4TABS 230* 02/10/2014   CD4TABS 230* 10/29/2013   Lab Results  Component Value Date   HIV1RNAQUANT 32* 07/04/2014   Lab Results  Component Value Date   HEPBSAB NEG 06/03/2013   No results found for: RPR  CBC Lab Results  Component Value Date   WBC 5.2 07/04/2014   RBC 4.71 07/04/2014   HGB 15.3 07/04/2014   HCT 43.1 07/04/2014   PLT 190 07/04/2014   MCV 91.5 07/04/2014   MCH 32.5 07/04/2014   MCHC 35.5 07/04/2014   RDW 14.7 07/04/2014   LYMPHSABS 1.8 07/04/2014   MONOABS 0.5 07/04/2014   EOSABS 0.1 07/04/2014   BASOSABS 0.1 07/04/2014   BMET Lab Results  Component Value Date   NA 137 07/04/2014   K 4.0 07/04/2014   CL 104 07/04/2014   CO2 26 07/04/2014   GLUCOSE 68* 07/04/2014   BUN 10 07/04/2014   CREATININE 1.22 07/04/2014   CALCIUM 9.1 07/04/2014   GFRNONAA 72 07/04/2014   GFRAA 83 07/04/2014     Assessment and Plan   hiv = well controlled. Patient took a long time to be virally suppressed likely due to nonadherence rather than resistance. genos have not shown any k103n, m184v,, mostly just TAMs. Patient is getting his own insurance through work, concern about co-pay, interested in going back to [place]. After review of genos, i  believe it would be possible to go back to stribild, esp since he is taking all his psych meds.  Bipolar= well controlled  Weight gain = will continue to monitor, attempt to get more exercise, albeit difficult as a truck driver

## 2014-10-11 LAB — RPR

## 2014-10-11 LAB — T-HELPER CELL (CD4) - (RCID CLINIC ONLY)
CD4 T CELL ABS: 240 /uL — AB (ref 400–2700)
CD4 T CELL HELPER: 22 % — AB (ref 33–55)

## 2014-10-11 LAB — HIV-1 RNA QUANT-NO REFLEX-BLD: HIV 1 RNA Quant: <20 copies/mL (ref <20)

## 2015-01-10 ENCOUNTER — Ambulatory Visit: Payer: Self-pay | Admitting: Internal Medicine

## 2015-01-17 ENCOUNTER — Other Ambulatory Visit: Payer: Self-pay | Admitting: Internal Medicine

## 2015-04-06 ENCOUNTER — Ambulatory Visit: Payer: Medicaid Other | Admitting: Internal Medicine

## 2015-07-31 ENCOUNTER — Ambulatory Visit: Payer: Self-pay | Admitting: Internal Medicine

## 2015-08-07 ENCOUNTER — Ambulatory Visit: Payer: Self-pay | Admitting: Internal Medicine

## 2015-08-17 ENCOUNTER — Ambulatory Visit: Payer: Self-pay | Admitting: Internal Medicine

## 2015-09-22 ENCOUNTER — Telehealth: Payer: Self-pay | Admitting: *Deleted

## 2015-09-22 NOTE — Telephone Encounter (Signed)
PA for Stribild completed on  CoverMyMeds and sent to [facility] for processing.

## 2015-09-26 NOTE — Telephone Encounter (Signed)
Approved [id number] valid 09/22/15 - 09/21/17. RN notified [facility]. Asked them to authorize only 1 month, as patient has not been seen since January 2015.  He must keep his appointment 1/12 for future refills. Landis Gandy, RN

## 2015-09-27 ENCOUNTER — Ambulatory Visit: Payer: Self-pay | Admitting: Internal Medicine

## 2015-10-10 ENCOUNTER — Other Ambulatory Visit: Payer: Self-pay | Admitting: *Deleted

## 2015-10-10 DIAGNOSIS — Z21 Asymptomatic human immunodeficiency virus [HIV] infection status: Secondary | ICD-10-CM

## 2015-10-10 MED ORDER — ELVITEG-COBIC-EMTRICIT-TENOFDF 150-150-200-300 MG PO TABS: 1.0000 | ORAL_TABLET | Freq: Every day | ORAL | Status: DC

## 2015-10-19 ENCOUNTER — Ambulatory Visit (INDEPENDENT_AMBULATORY_CARE_PROVIDER_SITE_OTHER): Payer: 59 | Admitting: Internal Medicine

## 2015-10-19 VITALS — BP 121/79 | HR 106 | Temp 97.8°F | Ht 69.0 in | Wt 187.5 lb

## 2015-10-19 DIAGNOSIS — B20 Human immunodeficiency virus [HIV] disease: Secondary | ICD-10-CM

## 2015-10-19 DIAGNOSIS — Z23 Encounter for immunization: Secondary | ICD-10-CM | POA: Diagnosis not present

## 2015-10-19 DIAGNOSIS — H6121 Impacted cerumen, right ear: Secondary | ICD-10-CM

## 2015-10-19 LAB — CBC WITH DIFFERENTIAL/PLATELET
BASOS ABS: 0 10*3/uL (ref 0.0–0.1)
BASOS PCT: 1 % (ref 0–1)
EOS ABS: 0 10*3/uL (ref 0.0–0.7)
Eosinophils Relative: 1 % (ref 0–5)
HEMATOCRIT: 44.6 % (ref 39.0–52.0)
Hemoglobin: 15.4 g/dL (ref 13.0–17.0)
LYMPHS ABS: 1.1 10*3/uL (ref 0.7–4.0)
Lymphocytes Relative: 27 % (ref 12–46)
MCH: 32.7 pg (ref 26.0–34.0)
MCHC: 34.5 g/dL (ref 30.0–36.0)
MCV: 94.7 fL (ref 78.0–100.0)
MPV: 10.4 fL (ref 8.6–12.4)
Monocytes Absolute: 0.4 10*3/uL (ref 0.1–1.0)
Monocytes Relative: 9 % (ref 3–12)
NEUTROS ABS: 2.5 10*3/uL (ref 1.7–7.7)
NEUTROS PCT: 62 % (ref 43–77)
Platelets: 193 10*3/uL (ref 150–400)
RBC: 4.71 MIL/uL (ref 4.22–5.81)
RDW: 14.1 % (ref 11.5–15.5)
WBC: 4 10*3/uL (ref 4.0–10.5)

## 2015-10-19 LAB — LIPID PANEL
CHOL/HDL RATIO: 5.6 ratio — AB (ref <=5.0)
CHOLESTEROL: 224 mg/dL — AB (ref 125–200)
HDL: 40 mg/dL (ref >=40)
LDL Cholesterol: 125 mg/dL (ref <130)
TRIGLYCERIDES: 296 mg/dL — AB (ref <150)
VLDL: 59 mg/dL — ABNORMAL HIGH (ref <30)

## 2015-10-19 LAB — COMPLETE METABOLIC PANEL WITH GFR
ALBUMIN: 4.1 g/dL (ref 3.6–5.1)
ALK PHOS: 58 U/L (ref 40–115)
ALT: 21 U/L (ref 9–46)
AST: 25 U/L (ref 10–40)
BUN: 12 mg/dL (ref 7–25)
CALCIUM: 8.9 mg/dL (ref 8.6–10.3)
CHLORIDE: 105 mmol/L (ref 98–110)
CO2: 26 mmol/L (ref 20–31)
Creat: 1.29 mg/dL (ref 0.60–1.35)
GFR, EST AFRICAN AMERICAN: 77 mL/min (ref >=60)
GFR, EST NON AFRICAN AMERICAN: 67 mL/min (ref >=60)
Glucose, Bld: 98 mg/dL (ref 65–99)
POTASSIUM: 3.7 mmol/L (ref 3.5–5.3)
Sodium: 140 mmol/L (ref 135–146)
Total Bilirubin: 0.4 mg/dL (ref 0.2–1.2)
Total Protein: 6.7 g/dL (ref 6.1–8.1)

## 2015-10-19 NOTE — Progress Notes (Signed)
Patient ID: Ryan [name], male   DOB: 12-Mar-1971, 45 y.o.   MRN: [id number]       Patient ID: DATRELL JAFRI, male   DOB: 12-18-1970, 45 y.o.   MRN: [id number]  HPI 45yo M with HIV disease, CD 4 count 240/LV<20( jan 2016) on stribild, also has bipolar disorder. Here for annual visit. 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 Lives in [place]. His son stays with him during the weekend.s he feels he is in good state of health since we last saw him  Injury "jammed thumb" since he slipped on the ice. Also has clogged right ear.   Outpatient Encounter Prescriptions as of 10/19/2015  Medication Sig  . acetaminophen (TYLENOL) 325 MG tablet Take 2 tablets (650 mg total) by mouth every 6 (six) hours as needed.  . citalopram (CELEXA) 20 MG tablet Take 1 tablet (20 mg total) by mouth daily.  Marland Kitchen dexlansoprazole (DEXILANT) 60 MG capsule Take 60 mg by mouth at bedtime.   . elvitegravir-cobicistat-emtricitabine-tenofovir (STRIBILD) 150-150-200-300 MG TABS tablet Take 1 tablet by mouth daily with breakfast.  . metoprolol succinate (TOPROL-XL) 50 MG 24 hr tablet TAKE 1 TABLET BY MOUTH DAILY* TAKE WITH OR IMMEDIATELY FOLLOWING A MEAL*  . promethazine (PHENERGAN) 25 MG tablet Take 1 tablet (25 mg total) by mouth every 6 (six) hours as needed for nausea or vomiting.  Marland Kitchen QUEtiapine (SEROQUEL XR) 400 MG 24 hr tablet Take 800 mg by mouth at bedtime.   No facility-administered encounter medications on file as of 10/19/2015.     Patient Active Problem List   Diagnosis Date Noted  . Homelessness 06/27/2013  . Weakness generalized 06/27/2013  . Abnormal TSH 06/27/2013  . Anemia 06/27/2013  . Fever and chills 06/25/2013  . PNA (pneumonia) 06/25/2013  . Anxiety 06/10/2013  . H/O: substance abuse 06/09/2013  . Malnutrition of moderate degree ([place]) 05/21/2013  . Human immunodeficiency virus (HIV) disease ([place]) 05/21/2013  . Orthostatic hypotension 05/20/2013  . Chest pain on exertion  05/20/2013  . Weight loss 05/20/2013  . Bipolar disorder ([place]) 05/20/2013  . GERD (gastroesophageal reflux disease) 05/20/2013  . Hypotension 01/03/2012  . ARF (acute renal failure) ([place]) 01/03/2012  . Rash 01/03/2012  . HTN (hypertension) 01/03/2012     Health Maintenance Due  Topic Date Due  . TETANUS/TDAP  11/21/1989  . INFLUENZA VACCINE  05/08/2015     Review of Systems  Constitutional: Negative for fever, chills, diaphoresis, activity change, appetite change, fatigue and unexpected weight change.  HENT: Negative for congestion, sore throat, rhinorrhea, sneezing, trouble swallowing and sinus pressure.  Eyes: Negative for photophobia and visual disturbance.  Respiratory: Negative for cough, chest tightness, shortness of breath, wheezing and stridor.  Cardiovascular: Negative for chest pain, palpitations and leg swelling.  Gastrointestinal: Negative for nausea, vomiting, abdominal pain, diarrhea, constipation, blood in stool, abdominal distention and anal bleeding.  Genitourinary: Negative for dysuria, hematuria, flank pain and difficulty urinating.  Musculoskeletal: Negative for myalgias, back pain, joint swelling, arthralgias and gait problem.  Skin: Negative for color change, pallor, rash and wound.  Neurological: Negative for dizziness, tremors, weakness and light-headedness.  Hematological: Negative for adenopathy. Does not bruise/bleed easily.  Psychiatric/Behavioral: Negative for behavioral problems, confusion, sleep disturbance, dysphoric mood, decreased concentration and agitation.    Physical Exam   BP 121/79 mmHg  Pulse 106  Temp(Src) 97.8 F (36.6 C) (Oral)  Ht 5\' 9"  (1.753 m)  Wt 187 lb 8 oz (85.049  kg)  BMI 27.68 kg/m2 Physical Exam  Constitutional: He is oriented to person, place, and time. He appears well-developed and well-nourished. No distress.  HENT:  Mouth/Throat: Oropharynx is clear and moist. No oropharyngeal exudate.  Cardiovascular: Normal  rate, regular rhythm and normal heart sounds. Exam reveals no gallop and no friction rub.  No murmur heard.  Pulmonary/Chest: Effort normal and breath sounds normal. No respiratory distress. He has no wheezes.  Abdominal: Soft. Bowel sounds are normal. He exhibits no distension. There is no tenderness.  Lymphadenopathy:  He has no cervical adenopathy.  Neurological: He is alert and oriented to person, place, and time.  Skin: Skin is warm and dry. No rash noted. No erythema.  Psychiatric: He has a normal mood and affect. His behavior is normal.     Lab Results  Component Value Date   CD4TCELL 22* 10/10/2014   Lab Results  Component Value Date   CD4TABS 240* 10/10/2014   CD4TABS 330* 07/04/2014   CD4TABS 230* 02/10/2014   Lab Results  Component Value Date   HIV1RNAQUANT <20 10/10/2014   Lab Results  Component Value Date   HEPBSAB NEG 06/03/2013   No results found for: RPR  CBC Lab Results  Component Value Date   WBC 4.7 10/10/2014   RBC 4.93 10/10/2014   HGB 16.1 10/10/2014   HCT 45.7 10/10/2014   PLT 204 10/10/2014   MCV 92.7 10/10/2014   MCH 32.7 10/10/2014   MCHC 35.2 10/10/2014   RDW 14.1 10/10/2014   LYMPHSABS 1.0 10/10/2014   MONOABS 0.5 10/10/2014   EOSABS 0.0 10/10/2014   BASOSABS 0.0 10/10/2014   BMET Lab Results  Component Value Date   NA 138 10/10/2014   K 4.5 10/10/2014   CL 101 10/10/2014   CO2 27 10/10/2014   GLUCOSE 84 10/10/2014   BUN 8 10/10/2014   CREATININE 1.23 10/10/2014   CALCIUM 9.4 10/10/2014   GFRNONAA 71 10/10/2014   GFRAA 83 10/10/2014     Assessment and Plan  hiv disease = plan on continue with stribild, will give co pay card. Will get annual labs. Check for cholesterol and syphilis  Bipolar disease= continue with current dosage for mental health regimen  Impacted cerumen to right ear =Will rinse ear  Health maintenance = off schedule with hep b series, will give 3rd dose today. received flu vaccine 3 months ago

## 2015-10-20 LAB — HIV-1 RNA QUANT-NO REFLEX-BLD: HIV-1 RNA Quant, Log: <1.3 Log copies/mL (ref <1.30)

## 2015-10-20 LAB — T-HELPER CELL (CD4) - (RCID CLINIC ONLY)
CD4 % Helper T Cell: 22 % — ABNORMAL LOW (ref 33–55)
CD4 T Cell Abs: 230 /uL — ABNORMAL LOW (ref 400–2700)

## 2015-10-20 LAB — RPR

## 2015-10-23 ENCOUNTER — Other Ambulatory Visit: Payer: Self-pay | Admitting: Internal Medicine

## 2015-10-23 DIAGNOSIS — B20 Human immunodeficiency virus [HIV] disease: Secondary | ICD-10-CM

## 2015-12-25 ENCOUNTER — Other Ambulatory Visit: Payer: Self-pay | Admitting: Internal Medicine

## 2016-01-09 ENCOUNTER — Telehealth (HOSPITAL_COMMUNITY): Payer: Self-pay | Admitting: *Deleted

## 2016-02-02 ENCOUNTER — Telehealth (HOSPITAL_COMMUNITY): Payer: Self-pay | Admitting: *Deleted

## 2016-02-02 NOTE — Telephone Encounter (Signed)
Ref from [facility] came to office. Called pt to sch appt and lmtcb and number was provided.

## 2016-03-08 ENCOUNTER — Telehealth (HOSPITAL_COMMUNITY): Payer: Self-pay | Admitting: *Deleted

## 2016-04-29 ENCOUNTER — Encounter (HOSPITAL_COMMUNITY): Payer: Self-pay

## 2016-04-29 ENCOUNTER — Ambulatory Visit (HOSPITAL_COMMUNITY): Payer: 59 | Admitting: Psychiatry

## 2016-08-07 ENCOUNTER — Ambulatory Visit: Admission: EM | Admit: 2016-08-07 | Discharge: 2016-08-07 | Discharge status: Absent without leave | Payer: 59

## 2016-08-19 ENCOUNTER — Ambulatory Visit: Payer: Self-pay | Admitting: Internal Medicine

## 2016-09-17 ENCOUNTER — Ambulatory Visit (INDEPENDENT_AMBULATORY_CARE_PROVIDER_SITE_OTHER): Payer: BLUE CROSS/BLUE SHIELD | Admitting: Internal Medicine

## 2016-09-17 ENCOUNTER — Encounter: Payer: Self-pay | Admitting: Internal Medicine

## 2016-09-17 VITALS — BP 150/78 | HR 90 | Temp 98.6°F | Wt 181.0 lb

## 2016-09-17 DIAGNOSIS — R030 Elevated blood-pressure reading, without diagnosis of hypertension: Secondary | ICD-10-CM | POA: Diagnosis not present

## 2016-09-17 DIAGNOSIS — B20 Human immunodeficiency virus [HIV] disease: Secondary | ICD-10-CM | POA: Diagnosis not present

## 2016-09-17 DIAGNOSIS — F3162 Bipolar disorder, current episode mixed, moderate: Secondary | ICD-10-CM | POA: Diagnosis not present

## 2016-09-17 DIAGNOSIS — Z23 Encounter for immunization: Secondary | ICD-10-CM | POA: Diagnosis not present

## 2016-09-17 LAB — CBC WITH DIFFERENTIAL/PLATELET
BASOS ABS: 82 {cells}/uL (ref 0–200)
Basophils Relative: 1 %
EOS ABS: 164 {cells}/uL (ref 15–500)
EOS PCT: 2 %
HCT: 45.3 % (ref 38.5–50.0)
Hemoglobin: 15.3 g/dL (ref 13.2–17.1)
LYMPHS PCT: 20 %
Lymphs Abs: 1640 cells/uL (ref 850–3900)
MCH: 32.1 pg (ref 27.0–33.0)
MCHC: 33.8 g/dL (ref 32.0–36.0)
MCV: 95.2 fL (ref 80.0–100.0)
MONOS PCT: 6 %
MPV: 10.6 fL (ref 7.5–12.5)
Monocytes Absolute: 492 cells/uL (ref 200–950)
Neutro Abs: 5822 cells/uL (ref 1500–7800)
Neutrophils Relative %: 71 %
Platelets: 233 10*3/uL (ref 140–400)
RBC: 4.76 MIL/uL (ref 4.20–5.80)
RDW: 13.9 % (ref 11.0–15.0)
WBC: 8.2 10*3/uL (ref 3.8–10.8)

## 2016-09-17 NOTE — Progress Notes (Addendum)
RFV: annual hiv disease follow up visit Patient ID: Ryan [name], male   DOB: 1971-03-11, 45 y.o.   MRN: [id number]  HPI  Ryan [name] is a 45yo M with hiv disease, cd 4 count of 230/VL<20 in January 2017 currently on stribild. Since we last saw him earlier in 2017, he now is married, has a wife who is pregnant (on PrEP) in her early second trimester of pregnancy. 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 He continues to be employed. He has new psychiatrist to help with management of bipolar disease. n  Outpatient Encounter Prescriptions as of 09/17/2016  Medication Sig  . acetaminophen (TYLENOL) 325 MG tablet Take 2 tablets (650 mg total) by mouth every 6 (six) hours as needed.  . citalopram (CELEXA) 20 MG tablet Take 1 tablet (20 mg total) by mouth daily.  Marland Kitchen dexlansoprazole (DEXILANT) 60 MG capsule Take 60 mg by mouth at bedtime.   . metoprolol succinate (TOPROL-XL) 50 MG 24 hr tablet TAKE 1 TABLET BY MOUTH DAILY WITH A MEAL OR IMMEDIATELY FOLLOWING A MEAL  . promethazine (PHENERGAN) 25 MG tablet Take 1 tablet (25 mg total) by mouth every 6 (six) hours as needed for nausea or vomiting.  Marland Kitchen QUEtiapine (SEROQUEL XR) 400 MG 24 hr tablet Take 800 mg by mouth at bedtime.  . STRIBILD 150-150-200-300 MG TABS tablet TAKE 1 TABLET BY MOUTH DAILY WITH BREAKFAST.   No facility-administered encounter medications on file as of 09/17/2016.      Patient Active Problem List   Diagnosis Date Noted  . Homelessness 06/27/2013  . Weakness generalized 06/27/2013  . Abnormal TSH 06/27/2013  . Anemia 06/27/2013  . Fever and chills 06/25/2013  . PNA (pneumonia) 06/25/2013  . Anxiety 06/10/2013  . H/O: substance abuse 06/09/2013  . Malnutrition of moderate degree ([place]) 05/21/2013  . Human immunodeficiency virus (HIV) disease ([place]) 05/21/2013  . Orthostatic hypotension 05/20/2013  . Chest pain on exertion 05/20/2013  . Weight loss  05/20/2013  . Bipolar disorder ([place]) 05/20/2013  . GERD (gastroesophageal reflux disease) 05/20/2013  . Hypotension 01/03/2012  . ARF (acute renal failure) ([place]) 01/03/2012  . Rash 01/03/2012  . HTN (hypertension) 01/03/2012     Health Maintenance Due  Topic Date Due  . TETANUS/TDAP  11/21/1989  . INFLUENZA VACCINE  05/07/2016     Review of Systems Review of Systems  Constitutional: Negative for fever, chills, diaphoresis, activity change, appetite change, fatigue and unexpected weight change.  HENT: Negative for congestion, sore throat, rhinorrhea, sneezing, trouble swallowing and sinus pressure.  Eyes: Negative for photophobia and visual disturbance.  Respiratory: Negative for cough, chest tightness, shortness of breath, wheezing and stridor.  Cardiovascular: Negative for chest pain, palpitations and leg swelling.  Gastrointestinal: Negative for nausea, vomiting, abdominal pain, diarrhea, constipation, blood in stool, abdominal distention and anal bleeding.  Genitourinary: Negative for dysuria, hematuria, flank pain and difficulty urinating.  Musculoskeletal: Negative for myalgias, back pain, joint swelling, arthralgias and gait problem.  Skin: Negative for color change, pallor, rash and wound.  Neurological: Negative for dizziness, tremors, weakness and light-headedness.  Hematological: Negative for adenopathy. Does not bruise/bleed easily.  Psychiatric/Behavioral: Negative for behavioral problems, confusion, sleep disturbance, dysphoric mood, decreased concentration and agitation.    Physical Exam   BP (!) 150/78   Pulse 90   Temp 98.6 F (37 C) (Oral)   Wt 181 lb (82.1 kg)   BMI 26.73 kg/m  Physical Exam  Constitutional: He is oriented to person, place, and time. He appears well-developed and well-nourished. No distress.  HENT:  Mouth/Throat: Oropharynx is clear and moist. No oropharyngeal exudate.  Cardiovascular: Normal rate, regular rhythm and normal heart sounds.  Exam reveals no gallop and no friction rub.  No murmur heard.  Pulmonary/Chest: Effort normal and breath sounds normal. No respiratory distress. He has no wheezes.  Abdominal: Soft. Bowel sounds are normal. He exhibits no distension. There is no tenderness.  Lymphadenopathy:  He has no cervical adenopathy.  Neurological: He is alert and oriented to person, place, and time.  Skin: Skin is warm and dry. No rash noted. No erythema.  Psychiatric: articulate. Slightly pressured    Lab Results  Component Value Date   CD4TCELL 22 (L) 10/19/2015   Lab Results  Component Value Date   CD4TABS 230 (L) 10/19/2015   CD4TABS 240 (L) 10/10/2014   CD4TABS 330 (L) 07/04/2014   Lab Results  Component Value Date   HIV1RNAQUANT <20 10/19/2015   Lab Results  Component Value Date   HEPBSAB NEG 06/03/2013   No results found for: RPR  CBC Lab Results  Component Value Date   WBC 4.0 10/19/2015   RBC 4.71 10/19/2015   HGB 15.4 10/19/2015   HCT 44.6 10/19/2015   PLT 193 10/19/2015   MCV 94.7 10/19/2015   MCH 32.7 10/19/2015   MCHC 34.5 10/19/2015   RDW 14.1 10/19/2015   LYMPHSABS 1.1 10/19/2015   MONOABS 0.4 10/19/2015   EOSABS 0.0 10/19/2015   BASOSABS 0.0 10/19/2015   BMET Lab Results  Component Value Date   NA 140 10/19/2015   K 3.7 10/19/2015   CL 105 10/19/2015   CO2 26 10/19/2015   GLUCOSE 98 10/19/2015   BUN 12 10/19/2015   CREATININE 1.29 10/19/2015   CALCIUM 8.9 10/19/2015   GFRNONAA 67 10/19/2015   GFRAA 77 10/19/2015     Assessment and Plan  hiv disease = previously well controlled on stribild. Will repeat labs today.   Addendum: his viral load is a 26, still well controlled. Continue on stribild  Health maintenance = will need meningococcal vaccine today, already received flu vaccine through ob visit  prehypertension = will watch at next visit to see if needs meds, vs. Dietary control    Bipolar disease =Seeking mental health treatment next week  Now  married and wife who is pregnant due in may

## 2016-09-18 LAB — COMPLETE METABOLIC PANEL WITH GFR
ALT: 14 U/L (ref 9–46)
AST: 18 U/L (ref 10–40)
Albumin: 4.3 g/dL (ref 3.6–5.1)
Alkaline Phosphatase: 49 U/L (ref 40–115)
BILIRUBIN TOTAL: 0.3 mg/dL (ref 0.2–1.2)
BUN: 12 mg/dL (ref 7–25)
CHLORIDE: 104 mmol/L (ref 98–110)
CO2: 25 mmol/L (ref 20–31)
Calcium: 9.1 mg/dL (ref 8.6–10.3)
Creat: 1.16 mg/dL (ref 0.60–1.35)
GFR, EST AFRICAN AMERICAN: 87 mL/min (ref >=60)
GFR, EST NON AFRICAN AMERICAN: 76 mL/min (ref >=60)
GLUCOSE: 84 mg/dL (ref 65–99)
POTASSIUM: 3.8 mmol/L (ref 3.5–5.3)
SODIUM: 139 mmol/L (ref 135–146)
Total Protein: 6.7 g/dL (ref 6.1–8.1)

## 2016-09-18 LAB — RPR

## 2016-09-19 LAB — T-HELPER CELL (CD4) - (RCID CLINIC ONLY)
CD4 % Helper T Cell: 20 % — ABNORMAL LOW (ref 33–55)
CD4 T CELL ABS: 350 /uL — AB (ref 400–2700)

## 2016-09-19 LAB — HIV-1 RNA QUANT-NO REFLEX-BLD
HIV 1 RNA QUANT: 26 {copies}/mL — AB (ref <20)
HIV-1 RNA QUANT, LOG: 1.41 {Log_copies}/mL — AB (ref <1.30)

## 2016-09-19 NOTE — Addendum Note (Signed)
Addended by: Janyce Llanos F on: 09/19/2016 10:04 AM   Modules accepted: Orders

## 2016-11-13 ENCOUNTER — Other Ambulatory Visit: Payer: Self-pay | Admitting: Internal Medicine

## 2016-11-13 DIAGNOSIS — B20 Human immunodeficiency virus [HIV] disease: Secondary | ICD-10-CM

## 2016-12-16 ENCOUNTER — Ambulatory Visit: Payer: BLUE CROSS/BLUE SHIELD | Admitting: Internal Medicine

## 2017-01-06 ENCOUNTER — Other Ambulatory Visit: Payer: BLUE CROSS/BLUE SHIELD

## 2017-01-06 DIAGNOSIS — F3162 Bipolar disorder, current episode mixed, moderate: Secondary | ICD-10-CM

## 2017-01-06 DIAGNOSIS — R590 Localized enlarged lymph nodes: Secondary | ICD-10-CM

## 2017-01-06 DIAGNOSIS — D893 Immune reconstitution syndrome: Secondary | ICD-10-CM

## 2017-01-06 DIAGNOSIS — B20 Human immunodeficiency virus [HIV] disease: Secondary | ICD-10-CM

## 2017-01-06 LAB — CBC WITH DIFFERENTIAL/PLATELET
BASOS ABS: 69 {cells}/uL (ref 0–200)
Basophils Relative: 1 %
EOS PCT: 3 %
Eosinophils Absolute: 207 cells/uL (ref 15–500)
HEMATOCRIT: 43.5 % (ref 38.5–50.0)
HEMOGLOBIN: 15 g/dL (ref 13.2–17.1)
LYMPHS ABS: 1311 {cells}/uL (ref 850–3900)
Lymphocytes Relative: 19 %
MCH: 33.1 pg — AB (ref 27.0–33.0)
MCHC: 34.5 g/dL (ref 32.0–36.0)
MCV: 96 fL (ref 80.0–100.0)
MONO ABS: 345 {cells}/uL (ref 200–950)
MPV: 11.4 fL (ref 7.5–12.5)
Monocytes Relative: 5 %
NEUTROS ABS: 4968 {cells}/uL (ref 1500–7800)
NEUTROS PCT: 72 %
Platelets: 184 10*3/uL (ref 140–400)
RBC: 4.53 MIL/uL (ref 4.20–5.80)
RDW: 14.1 % (ref 11.0–15.0)
WBC: 6.9 10*3/uL (ref 3.8–10.8)

## 2017-01-06 LAB — COMPLETE METABOLIC PANEL WITH GFR
ALK PHOS: 47 U/L (ref 40–115)
ALT: 11 U/L (ref 9–46)
AST: 16 U/L (ref 10–40)
Albumin: 4 g/dL (ref 3.6–5.1)
BILIRUBIN TOTAL: 0.3 mg/dL (ref 0.2–1.2)
BUN: 14 mg/dL (ref 7–25)
CO2: 25 mmol/L (ref 20–31)
Calcium: 9.1 mg/dL (ref 8.6–10.3)
Chloride: 104 mmol/L (ref 98–110)
Creat: 1.36 mg/dL — ABNORMAL HIGH (ref 0.60–1.35)
GFR, EST AFRICAN AMERICAN: 72 mL/min (ref >=60)
GFR, EST NON AFRICAN AMERICAN: 62 mL/min (ref >=60)
Glucose, Bld: 140 mg/dL — ABNORMAL HIGH (ref 65–99)
Potassium: 4 mmol/L (ref 3.5–5.3)
Sodium: 138 mmol/L (ref 135–146)
TOTAL PROTEIN: 6.6 g/dL (ref 6.1–8.1)

## 2017-01-07 LAB — T-HELPER CELL (CD4) - (RCID CLINIC ONLY)
CD4 % Helper T Cell: 27 % — ABNORMAL LOW (ref 33–55)
CD4 T CELL ABS: 340 /uL — AB (ref 400–2700)

## 2017-01-08 LAB — HIV-1 RNA QUANT-NO REFLEX-BLD
HIV 1 RNA QUANT: 147 {copies}/mL — AB
HIV-1 RNA Quant, Log: 2.17 Log copies/mL — ABNORMAL HIGH

## 2017-04-22 ENCOUNTER — Other Ambulatory Visit: Payer: Self-pay

## 2017-05-06 ENCOUNTER — Ambulatory Visit: Payer: Self-pay | Admitting: Internal Medicine

## 2017-05-14 ENCOUNTER — Other Ambulatory Visit: Payer: BLUE CROSS/BLUE SHIELD

## 2017-05-14 ENCOUNTER — Other Ambulatory Visit (HOSPITAL_COMMUNITY)
Admission: RE | Admit: 2017-05-14 | Discharge: 2017-05-14 | Disposition: A | Discharge status: Home / Self Care | Payer: BLUE CROSS/BLUE SHIELD | Source: Ambulatory Visit | Attending: Internal Medicine | Admitting: Internal Medicine

## 2017-05-14 DIAGNOSIS — Z79899 Other long term (current) drug therapy: Secondary | ICD-10-CM

## 2017-05-14 DIAGNOSIS — Z113 Encounter for screening for infections with a predominantly sexual mode of transmission: Secondary | ICD-10-CM | POA: Diagnosis not present

## 2017-05-14 DIAGNOSIS — B20 Human immunodeficiency virus [HIV] disease: Secondary | ICD-10-CM

## 2017-05-14 LAB — CBC WITH DIFFERENTIAL/PLATELET
BASOS PCT: 1 %
Basophils Absolute: 86 cells/uL (ref 0–200)
Eosinophils Absolute: 172 cells/uL (ref 15–500)
Eosinophils Relative: 2 %
HEMATOCRIT: 45.9 % (ref 38.5–50.0)
Hemoglobin: 15.7 g/dL (ref 13.2–17.1)
LYMPHS PCT: 13 %
Lymphs Abs: 1118 cells/uL (ref 850–3900)
MCH: 34.1 pg — ABNORMAL HIGH (ref 27.0–33.0)
MCHC: 34.2 g/dL (ref 32.0–36.0)
MCV: 99.6 fL (ref 80.0–100.0)
MONO ABS: 516 {cells}/uL (ref 200–950)
MPV: 11.3 fL (ref 7.5–12.5)
Monocytes Relative: 6 %
Neutro Abs: 6708 cells/uL (ref 1500–7800)
Neutrophils Relative %: 78 %
PLATELETS: 244 10*3/uL (ref 140–400)
RBC: 4.61 MIL/uL (ref 4.20–5.80)
RDW: 13.5 % (ref 11.0–15.0)
WBC: 8.6 10*3/uL (ref 3.8–10.8)

## 2017-05-15 LAB — T-HELPER CELL (CD4) - (RCID CLINIC ONLY)
CD4 T CELL ABS: 330 /uL — AB (ref 400–2700)
CD4 T CELL HELPER: 29 % — AB (ref 33–55)

## 2017-05-15 LAB — LIPID PANEL
Cholesterol: 141 mg/dL (ref <200)
HDL: 45 mg/dL (ref >40)
LDL CALC: 73 mg/dL (ref <100)
Total CHOL/HDL Ratio: 3.1 Ratio (ref <5.0)
Triglycerides: 113 mg/dL (ref <150)
VLDL: 23 mg/dL (ref <30)

## 2017-05-15 LAB — URINE CYTOLOGY ANCILLARY ONLY
Chlamydia: NEGATIVE
Neisseria Gonorrhea: NEGATIVE

## 2017-05-15 LAB — COMPREHENSIVE METABOLIC PANEL
ALK PHOS: 45 U/L (ref 40–115)
ALT: 12 U/L (ref 9–46)
AST: 16 U/L (ref 10–40)
Albumin: 4.2 g/dL (ref 3.6–5.1)
BILIRUBIN TOTAL: 0.4 mg/dL (ref 0.2–1.2)
BUN: 10 mg/dL (ref 7–25)
CO2: 23 mmol/L (ref 20–32)
CREATININE: 1.23 mg/dL (ref 0.60–1.35)
Calcium: 9.1 mg/dL (ref 8.6–10.3)
Chloride: 106 mmol/L (ref 98–110)
GLUCOSE: 100 mg/dL — AB (ref 65–99)
Potassium: 4.1 mmol/L (ref 3.5–5.3)
Sodium: 139 mmol/L (ref 135–146)
Total Protein: 6.5 g/dL (ref 6.1–8.1)

## 2017-05-15 LAB — RPR

## 2017-05-17 LAB — HIV-1 RNA QUANT-NO REFLEX-BLD
HIV 1 RNA Quant: 24 copies/mL — ABNORMAL HIGH
HIV-1 RNA Quant, Log: 1.38 Log copies/mL — ABNORMAL HIGH

## 2017-06-18 ENCOUNTER — Other Ambulatory Visit: Payer: Self-pay | Admitting: Internal Medicine

## 2017-06-18 DIAGNOSIS — B20 Human immunodeficiency virus [HIV] disease: Secondary | ICD-10-CM

## 2017-10-28 ENCOUNTER — Other Ambulatory Visit: Payer: Self-pay | Admitting: *Deleted

## 2017-10-28 ENCOUNTER — Other Ambulatory Visit: Payer: BLUE CROSS/BLUE SHIELD

## 2017-10-28 DIAGNOSIS — B20 Human immunodeficiency virus [HIV] disease: Secondary | ICD-10-CM

## 2017-10-28 LAB — COMPLETE METABOLIC PANEL WITH GFR
AG Ratio: 1.7 (calc) (ref 1.0–2.5)
ALBUMIN MSPROF: 4.1 g/dL (ref 3.6–5.1)
ALKALINE PHOSPHATASE (APISO): 47 U/L (ref 40–115)
ALT: 12 U/L (ref 9–46)
AST: 14 U/L (ref 10–40)
BILIRUBIN TOTAL: 0.4 mg/dL (ref 0.2–1.2)
BUN / CREAT RATIO: 16 (calc) (ref 6–22)
BUN: 25 mg/dL (ref 7–25)
CO2: 28 mmol/L (ref 20–32)
Calcium: 9.7 mg/dL (ref 8.6–10.3)
Chloride: 101 mmol/L (ref 98–110)
Creat: 1.54 mg/dL — ABNORMAL HIGH (ref 0.60–1.35)
GFR, Est African American: 62 mL/min/{1.73_m2} (ref >=60)
GFR, Est Non African American: 53 mL/min/{1.73_m2} — ABNORMAL LOW (ref >=60)
GLOBULIN: 2.4 g/dL (ref 1.9–3.7)
GLUCOSE: 81 mg/dL (ref 65–99)
Potassium: 4.3 mmol/L (ref 3.5–5.3)
SODIUM: 136 mmol/L (ref 135–146)
Total Protein: 6.5 g/dL (ref 6.1–8.1)

## 2017-10-28 LAB — CBC WITH DIFFERENTIAL/PLATELET
BASOS PCT: 0.9 %
Basophils Absolute: 81 cells/uL (ref 0–200)
EOS PCT: 2.2 %
Eosinophils Absolute: 198 cells/uL (ref 15–500)
HCT: 47.4 % (ref 38.5–50.0)
Hemoglobin: 16.5 g/dL (ref 13.2–17.1)
Lymphs Abs: 1683 cells/uL (ref 850–3900)
MCH: 33 pg (ref 27.0–33.0)
MCHC: 34.8 g/dL (ref 32.0–36.0)
MCV: 94.8 fL (ref 80.0–100.0)
MPV: 11.1 fL (ref 7.5–12.5)
Monocytes Relative: 7.3 %
NEUTROS PCT: 70.9 %
Neutro Abs: 6381 cells/uL (ref 1500–7800)
PLATELETS: 224 10*3/uL (ref 140–400)
RBC: 5 10*6/uL (ref 4.20–5.80)
RDW: 12.8 % (ref 11.0–15.0)
TOTAL LYMPHOCYTE: 18.7 %
WBC mixed population: 657 cells/uL (ref 200–950)
WBC: 9 10*3/uL (ref 3.8–10.8)

## 2017-10-29 LAB — T-HELPER CELL (CD4) - (RCID CLINIC ONLY)
CD4 % Helper T Cell: 23 % — ABNORMAL LOW (ref 33–55)
CD4 T Cell Abs: 450 /uL (ref 400–2700)

## 2017-10-30 LAB — HIV-1 RNA QUANT-NO REFLEX-BLD
HIV 1 RNA Quant: <20 copies/mL
HIV-1 RNA Quant, Log: <1.3 Log copies/mL

## 2017-12-29 ENCOUNTER — Telehealth: Payer: Self-pay

## 2017-12-29 ENCOUNTER — Encounter: Payer: Self-pay | Admitting: Internal Medicine

## 2017-12-29 ENCOUNTER — Ambulatory Visit (INDEPENDENT_AMBULATORY_CARE_PROVIDER_SITE_OTHER): Payer: BLUE CROSS/BLUE SHIELD | Admitting: Internal Medicine

## 2017-12-29 VITALS — BP 130/80 | HR 89 | Temp 97.7°F | Wt 174.0 lb

## 2017-12-29 DIAGNOSIS — B20 Human immunodeficiency virus [HIV] disease: Secondary | ICD-10-CM

## 2017-12-29 DIAGNOSIS — Z8659 Personal history of other mental and behavioral disorders: Secondary | ICD-10-CM | POA: Diagnosis not present

## 2017-12-29 DIAGNOSIS — F3162 Bipolar disorder, current episode mixed, moderate: Secondary | ICD-10-CM | POA: Diagnosis not present

## 2017-12-29 DIAGNOSIS — N182 Chronic kidney disease, stage 2 (mild): Secondary | ICD-10-CM | POA: Diagnosis not present

## 2017-12-29 MED ORDER — BICTEGRAVIR-EMTRICITAB-TENOFOV 50-200-25 MG PO TABS: 1.0000 | ORAL_TABLET | Freq: Every day | ORAL | 11 refills | Status: DC

## 2017-12-29 MED ORDER — CEPHALEXIN 500 MG PO CAPS: 500.0000 mg | ORAL_CAPSULE | Freq: Four times a day (QID) | ORAL | 0 refills | Status: DC

## 2017-12-29 MED ORDER — PREDNISONE 20 MG PO TABS: 20.0000 mg | ORAL_TABLET | Freq: Every day | ORAL | 0 refills | Status: DC

## 2017-12-29 NOTE — Progress Notes (Signed)
HPI: Ryan [name] is a 47 y.o. male who is here for his HIV visit with Dr. Baxter Flattery  Allergies: Allergies  Allergen Reactions  . Levaquin [Levofloxacin In D5w] Other (See Comments)    Pain & nerve damage in fingers  . Sulfa Antibiotics Rash    Vitals: Temp: 97.7 F (36.5 C) (03/25 1603) Temp Source: Oral (03/25 1603) BP: 130/80 (03/25 1603) Pulse Rate: 89 (03/25 1603)  Past Medical History: Past Medical History:  Diagnosis Date  . Bipolar disorder ([place])   . Depressed   . H/O: substance abuse 06/09/2013   Crack cocaine,  marijuana  . HIV (human immunodeficiency virus infection) ([place])   . Hypertension     Social History: Social History   Socioeconomic History  . Marital status: Single    Spouse name: Not on file  . Number of children: Not on file  . Years of education: Not on file  . Highest education level: Not on file  Occupational History  . Not on file  Social Needs  . Financial resource strain: Not on file  . Food insecurity:    Worry: Not on file    Inability: Not on file  . Transportation needs:    Medical: Not on file    Non-medical: Not on file  Tobacco Use  . Smoking status: Never Smoker  . Smokeless tobacco: Never Used  Substance and Sexual Activity  . Alcohol use: No  . Drug use: No    Types: Cocaine, Marijuana, Other-see comments    Comment: presription pills - history but has not done any in 1 year  . Sexual activity: Not Currently    Comment: declined condoms  Lifestyle  . Physical activity:    Days per week: Not on file    Minutes per session: Not on file  . Stress: Not on file  Relationships  . Social connections:    Talks on phone: Not on file    Gets together: Not on file    Attends religious service: Not on file    Active member of club or organization: Not on file    Attends meetings of clubs or organizations: Not on file    Relationship status: Not on file  Other Topics Concern  . Not on file  Social History Narrative  . Not  on file    Previous Regimen: DTG + Truvada + DRV/r  Current Regimen: Stribild  Labs: HIV 1 RNA Quant (copies/mL)  Date Value  10/28/2017 <20 NOT DETECTED  05/14/2017 24 (H)  01/06/2017 147 (H)   CD4 T Cell Abs (/uL)  Date Value  10/28/2017 450  05/14/2017 330 (L)  01/06/2017 340 (L)   Hep B S Ab (no units)  Date Value  06/03/2013 NEG   Hepatitis B Surface Ag (no units)  Date Value  06/03/2013 NEGATIVE   HCV Ab (no units)  Date Value  06/03/2013 NEGATIVE    CrCl: CrCl cannot be calculated (Patient's most recent lab result is older than the maximum 21 days allowed.).  Lipids:    Component Value Date/Time   CHOL 141 05/14/2017 1442   TRIG 113 05/14/2017 1442   HDL 45 05/14/2017 1442   CHOLHDL 3.1 05/14/2017 1442   VLDL 23 05/14/2017 1442   LDLCALC 73 05/14/2017 1442    Assessment: Ryan [name] is doing well on his Stribild. He has a hx of bipolar. We are planning to change his med to [place] today to avoid interactions. He currently takes some body building supplements (  D-aspartame. Biotin, MVI). These will not interact with his antiretroviral. He asked what he could take that could help him with muscle building. Explained to him that most of these supplements are not FDA regulated so we have no idea what is in them. We always worry about drug interactions. Maybe we can consider Egrifta in the future if he worries about the body fat issue.   Recommendations:  Change Stribild to [facility], PharmD, BCPS, AAHIVP, CPP Clinical Infectious [facility] 12/29/2017, 5:00 PM

## 2017-12-29 NOTE — Telephone Encounter (Signed)
Per Dr. Baxter Flattery I called pts pharmacy to refill pts medication. Spoke with the [facility] pharmacist who was able to take my call.  Dr. Baxter Flattery ordered for the pt to get refills on: Keflex 500 mg, Take one capsule by mouth 4 times a day. Qty: 28 caps  Predison 20 mg tablet, Take one tablet by mouth daily with breakfast. Qty: 5 tabs  Biktarvy 50-200-25 mg tabs, take one tab by mouth daily, Qyt 30, 11 refills  Aundria Rud, CMA

## 2017-12-29 NOTE — Progress Notes (Signed)
RFV: follow up for hiv  Patient ID: Ryan [name], male   DOB: 15-Oct-1970, 47 y.o.   MRN: [id number]  HPI Ryan [name] is a 47 yo M with hiv disease, bipolar disease plus borderline PD, CD 4 count 450/VL<20 currently on stribild.  Since we last saw him, he now has a 57 month old daughter. Doing well with taking his medications. He is doing more weight lifting and taking supplements to help with weight loss/physique. He is saddened that he is not as close to his 7 year old son as he used to be--which was his main motivation for getting treatment for mental health and hiv.  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  Outpatient Encounter Medications as of 12/29/2017  Medication Sig  . acetaminophen (TYLENOL) 325 MG tablet Take 2 tablets (650 mg total) by mouth every 6 (six) hours as needed.  . lithium 300 MG tablet   . QUEtiapine (SEROQUEL XR) 400 MG 24 hr tablet Take 100 mg by mouth at bedtime.   . STRIBILD 150-150-200-300 MG TABS tablet TAKE 1 TABLET BY MOUTH WITH BREAKFAST  . citalopram (CELEXA) 20 MG tablet Take 1 tablet (20 mg total) by mouth daily. (Patient not taking: Reported on 12/29/2017)  . dexlansoprazole (DEXILANT) 60 MG capsule Take 60 mg by mouth at bedtime.   . metoprolol succinate (TOPROL-XL) 50 MG 24 hr tablet TAKE 1 TABLET BY MOUTH DAILY WITH A MEAL OR IMMEDIATELY FOLLOWING A MEAL (Patient not taking: Reported on 12/29/2017)  . promethazine (PHENERGAN) 25 MG tablet Take 1 tablet (25 mg total) by mouth every 6 (six) hours as needed for nausea or vomiting. (Patient not taking: Reported on 12/29/2017)   No facility-administered encounter medications on file as of 12/29/2017.      Patient Active Problem List   Diagnosis Date Noted  . Homelessness 06/27/2013  . Weakness generalized 06/27/2013  . Abnormal TSH 06/27/2013  . Anemia 06/27/2013  . Fever and chills 06/25/2013  . PNA (pneumonia) 06/25/2013  . Anxiety 06/10/2013    . H/O: substance abuse 06/09/2013  . Malnutrition of moderate degree ([place]) 05/21/2013  . Human immunodeficiency virus (HIV) disease ([place]) 05/21/2013  . Orthostatic hypotension 05/20/2013  . Chest pain on exertion 05/20/2013  . Weight loss 05/20/2013  . Bipolar disorder ([place]) 05/20/2013  . GERD (gastroesophageal reflux disease) 05/20/2013  . Hypotension 01/03/2012  . ARF (acute renal failure) ([place]) 01/03/2012  . Rash 01/03/2012  . HTN (hypertension) 01/03/2012     Health Maintenance Due  Topic Date Due  . TETANUS/TDAP  11/21/1989  . INFLUENZA VACCINE  05/07/2017     Review of Systems  Physical Exam   BP 130/80   Pulse 89   Temp 97.7 F (36.5 C) (Oral)   Wt 174 lb (78.9 kg)   BMI 25.70 kg/m    Lab Results  Component Value Date   CD4TCELL 23 (L) 10/28/2017   Lab Results  Component Value Date   CD4TABS 450 10/28/2017   CD4TABS 330 (L) 05/14/2017   CD4TABS 340 (L) 01/06/2017   Lab Results  Component Value Date   HIV1RNAQUANT <20 NOT DETECTED 10/28/2017   Lab Results  Component Value Date   HEPBSAB NEG 06/03/2013   Lab Results  Component Value Date   LABRPR NON REAC 05/14/2017    CBC Lab Results  Component Value Date   WBC 9.0 10/28/2017   RBC 5.00 10/28/2017   HGB 16.5 10/28/2017  HCT 47.4 10/28/2017   PLT 224 10/28/2017   MCV 94.8 10/28/2017   MCH 33.0 10/28/2017   MCHC 34.8 10/28/2017   RDW 12.8 10/28/2017   LYMPHSABS 1,683 10/28/2017   MONOABS 516 05/14/2017   EOSABS 198 10/28/2017    BMET Lab Results  Component Value Date   NA 136 10/28/2017   K 4.3 10/28/2017   CL 101 10/28/2017   CO2 28 10/28/2017   GLUCOSE 81 10/28/2017   BUN 25 10/28/2017   CREATININE 1.54 (H) 10/28/2017   CALCIUM 9.7 10/28/2017   GFRNONAA 53 (L) 10/28/2017   GFRAA 62 10/28/2017      Assessment and Plan  hiv disease = well controlled. Will change him to biktarvy to minimize risk of his psych med drug interactions with cobi. Will have him come back in 4  wk to do lab work  ckd 2 = will continue to monitor cr function. Anticipate that it will trend down once no longer on cobicistat  Bipolar disease = occ feeling down with some paranoia he has noticed of late. No thoughts of harm to self or others. 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 Continue on current medication. Will mention that he can speak to inhouse counselors as well  Health maintenance = pneumococcal this fall plus 2nd booster of meningococcal

## 2018-02-11 ENCOUNTER — Ambulatory Visit: Payer: BLUE CROSS/BLUE SHIELD | Admitting: Internal Medicine

## 2018-11-25 ENCOUNTER — Other Ambulatory Visit: Payer: Self-pay | Admitting: *Deleted

## 2018-11-25 DIAGNOSIS — Z79899 Other long term (current) drug therapy: Secondary | ICD-10-CM

## 2018-11-25 DIAGNOSIS — B20 Human immunodeficiency virus [HIV] disease: Secondary | ICD-10-CM

## 2018-11-25 DIAGNOSIS — Z113 Encounter for screening for infections with a predominantly sexual mode of transmission: Secondary | ICD-10-CM

## 2018-12-01 ENCOUNTER — Other Ambulatory Visit: Payer: BLUE CROSS/BLUE SHIELD

## 2018-12-01 DIAGNOSIS — B20 Human immunodeficiency virus [HIV] disease: Secondary | ICD-10-CM

## 2018-12-01 DIAGNOSIS — Z79899 Other long term (current) drug therapy: Secondary | ICD-10-CM

## 2018-12-01 DIAGNOSIS — Z113 Encounter for screening for infections with a predominantly sexual mode of transmission: Secondary | ICD-10-CM

## 2018-12-03 LAB — COMPLETE METABOLIC PANEL WITH GFR
AG Ratio: 1.7 (calc) (ref 1.0–2.5)
ALKALINE PHOSPHATASE (APISO): 45 U/L (ref 36–130)
ALT: 12 U/L (ref 9–46)
AST: 15 U/L (ref 10–40)
Albumin: 4.5 g/dL (ref 3.6–5.1)
BUN/Creatinine Ratio: 11 (calc) (ref 6–22)
BUN: 16 mg/dL (ref 7–25)
CO2: 26 mmol/L (ref 20–32)
CREATININE: 1.42 mg/dL — AB (ref 0.60–1.35)
Calcium: 9.2 mg/dL (ref 8.6–10.3)
Chloride: 104 mmol/L (ref 98–110)
GFR, Est African American: 67 mL/min/{1.73_m2} (ref >=60)
GFR, Est Non African American: 58 mL/min/{1.73_m2} — ABNORMAL LOW (ref >=60)
Globulin: 2.6 g/dL (calc) (ref 1.9–3.7)
Glucose, Bld: 90 mg/dL (ref 65–99)
Potassium: 4.2 mmol/L (ref 3.5–5.3)
Sodium: 139 mmol/L (ref 135–146)
Total Bilirubin: 0.4 mg/dL (ref 0.2–1.2)
Total Protein: 7.1 g/dL (ref 6.1–8.1)

## 2018-12-03 LAB — HIV-1 RNA QUANT-NO REFLEX-BLD
HIV 1 RNA Quant: 68 copies/mL — ABNORMAL HIGH
HIV-1 RNA Quant, Log: 1.83 Log copies/mL — ABNORMAL HIGH

## 2018-12-03 LAB — T-HELPER CELL (CD4) - (RCID CLINIC ONLY)
CD4 % Helper T Cell: 25 % — ABNORMAL LOW (ref 33–55)
CD4 T CELL ABS: 420 /uL (ref 400–2700)

## 2018-12-03 LAB — CBC WITH DIFFERENTIAL/PLATELET
Absolute Monocytes: 681 cells/uL (ref 200–950)
BASOS ABS: 68 {cells}/uL (ref 0–200)
Basophils Relative: 1.3 %
EOS ABS: 120 {cells}/uL (ref 15–500)
Eosinophils Relative: 2.3 %
HCT: 47 % (ref 38.5–50.0)
Hemoglobin: 16.5 g/dL (ref 13.2–17.1)
Lymphs Abs: 1685 cells/uL (ref 850–3900)
MCH: 33.5 pg — ABNORMAL HIGH (ref 27.0–33.0)
MCHC: 35.1 g/dL (ref 32.0–36.0)
MCV: 95.5 fL (ref 80.0–100.0)
MPV: 10.9 fL (ref 7.5–12.5)
Monocytes Relative: 13.1 %
Neutro Abs: 2647 cells/uL (ref 1500–7800)
Neutrophils Relative %: 50.9 %
Platelets: 273 10*3/uL (ref 140–400)
RBC: 4.92 10*6/uL (ref 4.20–5.80)
RDW: 12.5 % (ref 11.0–15.0)
Total Lymphocyte: 32.4 %
WBC: 5.2 10*3/uL (ref 3.8–10.8)

## 2018-12-03 LAB — LIPID PANEL
Cholesterol: 188 mg/dL (ref <200)
HDL: 51 mg/dL (ref >=40)
LDL Cholesterol (Calc): 104 mg/dL (calc) — ABNORMAL HIGH
Non-HDL Cholesterol (Calc): 137 mg/dL (calc) — ABNORMAL HIGH (ref <130)
Total CHOL/HDL Ratio: 3.7 (calc) (ref <5.0)
Triglycerides: 221 mg/dL — ABNORMAL HIGH (ref <150)

## 2018-12-03 LAB — RPR: RPR: NONREACTIVE

## 2018-12-14 ENCOUNTER — Other Ambulatory Visit: Payer: Self-pay | Admitting: Internal Medicine

## 2018-12-15 ENCOUNTER — Encounter: Payer: Self-pay | Admitting: Internal Medicine

## 2018-12-15 ENCOUNTER — Other Ambulatory Visit: Payer: Self-pay

## 2018-12-15 ENCOUNTER — Ambulatory Visit (INDEPENDENT_AMBULATORY_CARE_PROVIDER_SITE_OTHER): Payer: BLUE CROSS/BLUE SHIELD | Admitting: Internal Medicine

## 2018-12-15 VITALS — BP 156/82 | HR 82 | Temp 98.6°F | Wt 165.0 lb

## 2018-12-15 DIAGNOSIS — N182 Chronic kidney disease, stage 2 (mild): Secondary | ICD-10-CM

## 2018-12-15 DIAGNOSIS — R1310 Dysphagia, unspecified: Secondary | ICD-10-CM

## 2018-12-15 DIAGNOSIS — Z23 Encounter for immunization: Secondary | ICD-10-CM

## 2018-12-15 DIAGNOSIS — B20 Human immunodeficiency virus [HIV] disease: Secondary | ICD-10-CM

## 2018-12-15 DIAGNOSIS — R1319 Other dysphagia: Secondary | ICD-10-CM

## 2018-12-15 MED ORDER — FLUTICASONE FUROATE 27.5 MCG/SPRAY NA SUSP: 2.0000 | Freq: Every day | NASAL | 12 refills | Status: DC

## 2018-12-15 MED ORDER — CETIRIZINE HCL 10 MG PO TABS: 10.0000 mg | ORAL_TABLET | Freq: Every day | ORAL | 11 refills | Status: DC

## 2018-12-15 MED ORDER — BICTEGRAVIR-EMTRICITAB-TENOFOV 50-200-25 MG PO TABS: 1.0000 | ORAL_TABLET | Freq: Every day | ORAL | 11 refills | Status: DC

## 2018-12-15 NOTE — Progress Notes (Signed)
Patient ID: Ryan [name], male   DOB: October 19, 1970, 48 y.o.   MRN: [id number]  HPI  48yo M with hiv disease, CD 4 count 420/VL 68. 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  Has 22 month daughter Outpatient Encounter Medications as of 12/15/2018  Medication Sig  . acetaminophen (TYLENOL) 325 MG tablet Take 2 tablets (650 mg total) by mouth every 6 (six) hours as needed.  Marland Kitchen BIKTARVY 50-200-25 MG TABS tablet Take 1 tablet by mouth once daily  . cephALEXin (KEFLEX) 500 MG capsule Take 1 capsule (500 mg total) by mouth 4 (four) times daily.  . citalopram (CELEXA) 20 MG tablet Take 1 tablet (20 mg total) by mouth daily.  Marland Kitchen dexlansoprazole (DEXILANT) 60 MG capsule Take 60 mg by mouth at bedtime.   Marland Kitchen lithium 300 MG tablet   . metoprolol succinate (TOPROL-XL) 50 MG 24 hr tablet TAKE 1 TABLET BY MOUTH DAILY WITH A MEAL OR IMMEDIATELY FOLLOWING A MEAL  . predniSONE (DELTASONE) 20 MG tablet Take 1 tablet (20 mg total) by mouth daily with breakfast.  . promethazine (PHENERGAN) 25 MG tablet Take 1 tablet (25 mg total) by mouth every 6 (six) hours as needed for nausea or vomiting.  Marland Kitchen QUEtiapine (SEROQUEL XR) 400 MG 24 hr tablet Take 100 mg by mouth at bedtime.    No facility-administered encounter medications on file as of 12/15/2018.      Patient Active Problem List   Diagnosis Date Noted  . Homelessness 06/27/2013  . Weakness generalized 06/27/2013  . Abnormal TSH 06/27/2013  . Anemia 06/27/2013  . Fever and chills 06/25/2013  . PNA (pneumonia) 06/25/2013  . Anxiety 06/10/2013  . H/O: substance abuse ([place]) 06/09/2013  . Malnutrition of moderate degree ([place]) 05/21/2013  . Human immunodeficiency virus (HIV) disease ([place]) 05/21/2013  . Orthostatic hypotension 05/20/2013  . Chest pain on exertion 05/20/2013  . Weight loss 05/20/2013  . Bipolar disorder ([place]) 05/20/2013  . GERD (gastroesophageal reflux disease) 05/20/2013  . Hypotension  01/03/2012  . ARF (acute renal failure) ([place]) 01/03/2012  . Rash 01/03/2012  . HTN (hypertension) 01/03/2012     Health Maintenance Due  Topic Date Due  . TETANUS/TDAP  11/21/1989  . INFLUENZA VACCINE  05/07/2018     Review of Systems Review of Systems  Constitutional: Negative for fever, chills, diaphoresis, activity change, appetite change, fatigue and unexpected weight change.  HENT: Negative for congestion, sore throat, rhinorrhea, sneezing, trouble swallowing and sinus pressure.  Eyes: Negative for photophobia and visual disturbance.  Respiratory: Negative for cough, chest tightness, shortness of breath, wheezing and stridor.  Cardiovascular: Negative for chest pain, palpitations and leg swelling.  Gastrointestinal: Negative for nausea, vomiting, abdominal pain, diarrhea, constipation, blood in stool, abdominal distention and anal bleeding.  Genitourinary: Negative for dysuria, hematuria, flank pain and difficulty urinating.  Musculoskeletal: Negative for myalgias, back pain, joint swelling, arthralgias and gait problem.  Skin: Negative for color change, pallor, rash and wound.  Neurological: Negative for dizziness, tremors, weakness and light-headedness.  Hematological: Negative for adenopathy. Does not bruise/bleed easily.  Psychiatric/Behavioral: Negative for behavioral problems, confusion, sleep disturbance, dysphoric mood, decreased concentration and agitation.    Physical Exam   BP (!) 156/82   Pulse 82   Temp 98.6 F (37 C) (Oral)   Wt 165 lb (74.8 kg)   BMI 24.37 kg/m   Physical Exam  Constitutional: He is oriented to person, place, and time. He appears well-developed  and well-nourished. No distress.  HENT:  Mouth/Throat: Oropharynx is clear and moist. No oropharyngeal exudate.  Cardiovascular: Normal rate, regular rhythm and normal heart sounds. Exam reveals no gallop and no friction rub.  No murmur heard.  Pulmonary/Chest: Effort normal and breath sounds  normal. No respiratory distress. He has no wheezes.  Abdominal: Soft. Bowel sounds are normal. He exhibits no distension. There is no tenderness.  Lymphadenopathy:  He has no cervical adenopathy.  Neurological: He is alert and oriented to person, place, and time.  Skin: Skin is warm and dry. No rash noted. No erythema.  Psychiatric: He has a normal mood and affect. His behavior is normal.    Lab Results  Component Value Date   CD4TCELL 25 (L) 12/01/2018   Lab Results  Component Value Date   CD4TABS 420 12/01/2018   CD4TABS 450 10/28/2017   CD4TABS 330 (L) 05/14/2017   Lab Results  Component Value Date   HIV1RNAQUANT 68 (H) 12/01/2018   Lab Results  Component Value Date   HEPBSAB NEG 06/03/2013   Lab Results  Component Value Date   LABRPR NON-REACTIVE 12/01/2018    CBC Lab Results  Component Value Date   WBC 5.2 12/01/2018   RBC 4.92 12/01/2018   HGB 16.5 12/01/2018   HCT 47.0 12/01/2018   PLT 273 12/01/2018   MCV 95.5 12/01/2018   MCH 33.5 (H) 12/01/2018   MCHC 35.1 12/01/2018   RDW 12.5 12/01/2018   LYMPHSABS 1,685 12/01/2018   MONOABS 516 05/14/2017   EOSABS 120 12/01/2018    BMET Lab Results  Component Value Date   NA 139 12/01/2018   K 4.2 12/01/2018   CL 104 12/01/2018   CO2 26 12/01/2018   GLUCOSE 90 12/01/2018   BUN 16 12/01/2018   CREATININE 1.42 (H) 12/01/2018   CALCIUM 9.2 12/01/2018   GFRNONAA 58 (L) 12/01/2018   GFRAA 67 12/01/2018     Assessment and Plan  ckd 3 = will check to see if this his baseline. Also ask not to supplement with creatine. Ensure he drinks liquids while on the road  hiv disease = well controlled. Continue on current regimen. Slight viral blip of 68.  Health promotion = prevnar 13 today  Seasonal allergies = zyrtec and flonase  eso dysphagia = maybe needs EGD will refer to GI

## 2019-01-05 ENCOUNTER — Telehealth: Payer: Self-pay | Admitting: Behavioral Health

## 2019-01-05 NOTE — Telephone Encounter (Signed)
Patient called stating Dr. Baxter Flattery prescribed Flonase and Zyrtec for his seasonal allergies.  He called requesting something stronger.  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  Patient states he is afraid someone is going to think he has the Corona Virus. Pricilla Riffle RN

## 2019-03-19 ENCOUNTER — Encounter: Payer: Self-pay | Admitting: Internal Medicine

## 2019-03-29 ENCOUNTER — Telehealth: Payer: Self-pay | Admitting: *Deleted

## 2019-03-29 NOTE — Telephone Encounter (Signed)
Patient called to report that he was in the hospital over the weekend 03/27/19 for anaphalyatic shock due to eating red meat. He found 5+ ticks on himself a couple of weeks ago and was told by doctors there that they were [place] ticks and his allergic reaction from this weekend was due to his consumption to red meat which he is not allergic to. He would like to be seen as he was also told he needs to see his ID doctor because some of his medication could cause a reaction. Gave him an office visit for 04/07/19.

## 2019-04-07 ENCOUNTER — Ambulatory Visit: Payer: BLUE CROSS/BLUE SHIELD | Admitting: Internal Medicine

## 2019-04-16 ENCOUNTER — Ambulatory Visit: Payer: Self-pay | Admitting: Allergy & Immunology

## 2019-04-23 ENCOUNTER — Ambulatory Visit: Payer: BC Managed Care – PPO | Admitting: Allergy & Immunology

## 2019-04-29 ENCOUNTER — Ambulatory Visit: Payer: BC Managed Care – PPO | Admitting: Allergy

## 2019-06-17 ENCOUNTER — Ambulatory Visit: Payer: BC Managed Care – PPO | Admitting: Internal Medicine

## 2019-08-23 ENCOUNTER — Other Ambulatory Visit: Payer: Self-pay | Admitting: Internal Medicine

## 2019-08-27 ENCOUNTER — Other Ambulatory Visit: Payer: Self-pay | Admitting: *Deleted

## 2019-08-27 DIAGNOSIS — B20 Human immunodeficiency virus [HIV] disease: Secondary | ICD-10-CM

## 2019-08-27 MED ORDER — BIKTARVY 50-200-25 MG PO TABS: 1.0000 | ORAL_TABLET | Freq: Every day | ORAL | 11 refills | Status: DC

## 2019-11-24 ENCOUNTER — Other Ambulatory Visit: Payer: BC Managed Care – PPO

## 2019-12-13 ENCOUNTER — Other Ambulatory Visit: Payer: BC Managed Care – PPO

## 2019-12-23 ENCOUNTER — Encounter: Payer: Self-pay | Admitting: Internal Medicine

## 2019-12-23 ENCOUNTER — Other Ambulatory Visit: Payer: Self-pay

## 2019-12-23 ENCOUNTER — Ambulatory Visit (INDEPENDENT_AMBULATORY_CARE_PROVIDER_SITE_OTHER): Payer: BC Managed Care – PPO | Admitting: Internal Medicine

## 2019-12-23 VITALS — BP 138/94 | HR 79 | Temp 98.3°F | Ht 70.0 in | Wt 194.0 lb

## 2019-12-23 DIAGNOSIS — Z79899 Other long term (current) drug therapy: Secondary | ICD-10-CM | POA: Diagnosis not present

## 2019-12-23 DIAGNOSIS — J302 Other seasonal allergic rhinitis: Secondary | ICD-10-CM

## 2019-12-23 DIAGNOSIS — B20 Human immunodeficiency virus [HIV] disease: Secondary | ICD-10-CM

## 2019-12-23 DIAGNOSIS — F3162 Bipolar disorder, current episode mixed, moderate: Secondary | ICD-10-CM

## 2019-12-23 MED ORDER — MONTELUKAST SODIUM 10 MG PO TABS: 10.0000 mg | ORAL_TABLET | Freq: Every day | ORAL | 3 refills | Status: DC

## 2019-12-23 NOTE — Patient Instructions (Addendum)
   COVID-19 Vaccine Information can be found at: ShippingScam.co.uk For questions related to vaccine distribution or appointments, please email [email or web address] or call [phone].    FEMA site" [place]" covid vaccine number =   [phone]

## 2019-12-23 NOTE — Progress Notes (Signed)
Patient ID: Ryan [name], male   DOB: 03-05-1971, 49 y.o.   MRN: [id number]  HPI Ryan [name] is a 49yo M with well controlled hiv disease, here for annual visit. Since our last visit, he reports that he has developed Alpha-gal -allergy -- had anaphylaxis after eating a burger. As well as, he was been dx with Rheumatoid arthritis. Overall doing better, dietary changes. He does notice having seasonal allergies, and would like prescription for singulair prescription for seasonal allergies  Works 70 hrs per week. -he realizes that sleep important to not affect his mood   Outpatient Encounter Medications as of 12/23/2019  Medication Sig  . amLODipine (NORVASC) 5 MG tablet Take by mouth.  . bictegravir-emtricitabine-tenofovir AF (BIKTARVY) 50-200-25 MG TABS tablet Take 1 tablet by mouth daily.  . cetirizine (ZYRTEC) 10 MG tablet Take 1 tablet (10 mg total) by mouth daily.  . fluticasone (FLONASE SENSIMIST) 27.5 MCG/SPRAY nasal spray Place 2 sprays into the nose daily.  Marland Kitchen lithium 300 MG tablet   . QUEtiapine (SEROQUEL XR) 400 MG 24 hr tablet Take 100 mg by mouth at bedtime.   Marland Kitchen acetaminophen (TYLENOL) 325 MG tablet Take 2 tablets (650 mg total) by mouth every 6 (six) hours as needed. (Patient not taking: Reported on 12/23/2019)  . metoprolol succinate (TOPROL-XL) 50 MG 24 hr tablet TAKE 1 TABLET BY MOUTH DAILY WITH A MEAL OR IMMEDIATELY FOLLOWING A MEAL (Patient not taking: Reported on 12/23/2019)  . promethazine (PHENERGAN) 25 MG tablet Take 1 tablet (25 mg total) by mouth every 6 (six) hours as needed for nausea or vomiting. (Patient not taking: Reported on 12/23/2019)   No facility-administered encounter medications on file as of 12/23/2019.     Patient Active Problem List   Diagnosis Date Noted  . Homelessness 06/27/2013  . Weakness generalized 06/27/2013  . Abnormal TSH 06/27/2013  . Anemia 06/27/2013  . Fever and chills 06/25/2013  . PNA (pneumonia) 06/25/2013  . Anxiety 06/10/2013  .  H/O: substance abuse ([place]) 06/09/2013  . Malnutrition of moderate degree ([place]) 05/21/2013  . Human immunodeficiency virus (HIV) disease ([place]) 05/21/2013  . Orthostatic hypotension 05/20/2013  . Chest pain on exertion 05/20/2013  . Weight loss 05/20/2013  . Bipolar disorder ([place]) 05/20/2013  . GERD (gastroesophageal reflux disease) 05/20/2013  . Hypotension 01/03/2012  . ARF (acute renal failure) ([place]) 01/03/2012  . Rash 01/03/2012  . HTN (hypertension) 01/03/2012     Health Maintenance Due  Topic Date Due  . INFLUENZA VACCINE  05/08/2019     Review of Systems +seasonal allergies. Otherwise 12 point ros is negative Physical Exam   BP (!) 138/94   Pulse 79   Temp 98.3 F (36.8 C)   Ht 5\' 10"  (1.778 m)   Wt 194 lb (88 kg)   SpO2 97%   BMI 27.84 kg/m   Physical Exam  Constitutional: He is oriented to person, place, and time. He appears well-developed and well-nourished. No distress.  HENT:  Mouth/Throat: Oropharynx is clear and moist. No oropharyngeal exudate.  Cardiovascular: Normal rate, regular rhythm and normal heart sounds. Exam reveals no gallop and no friction rub.  No murmur heard.  Pulmonary/Chest: Effort normal and breath sounds normal. No respiratory distress. He has no wheezes.  Abdominal: Soft. Bowel sounds are normal. He exhibits no distension. There is no tenderness.  Lymphadenopathy:  He has no cervical adenopathy.  Neurological: He is alert and oriented to person, place, and time.  Skin: Skin is warm and dry. No  rash noted. No erythema.  Psychiatric: He has a normal mood and affect. His behavior is normal.    Lab Results  Component Value Date   CD4TCELL 25 (L) 12/01/2018   Lab Results  Component Value Date   CD4TABS 420 12/01/2018   CD4TABS 450 10/28/2017   CD4TABS 330 (L) 05/14/2017   Lab Results  Component Value Date   HIV1RNAQUANT 68 (H) 12/01/2018   Lab Results  Component Value Date   HEPBSAB NEG 06/03/2013   Lab Results  Component  Value Date   LABRPR NON-REACTIVE 12/01/2018    CBC Lab Results  Component Value Date   WBC 5.2 12/01/2018   RBC 4.92 12/01/2018   HGB 16.5 12/01/2018   HCT 47.0 12/01/2018   PLT 273 12/01/2018   MCV 95.5 12/01/2018   MCH 33.5 (H) 12/01/2018   MCHC 35.1 12/01/2018   RDW 12.5 12/01/2018   LYMPHSABS 1,685 12/01/2018   MONOABS 516 05/14/2017   EOSABS 120 12/01/2018    BMET Lab Results  Component Value Date   NA 139 12/01/2018   K 4.2 12/01/2018   CL 104 12/01/2018   CO2 26 12/01/2018   GLUCOSE 90 12/01/2018   BUN 16 12/01/2018   CREATININE 1.42 (H) 12/01/2018   CALCIUM 9.2 12/01/2018   GFRNONAA 58 (L) 12/01/2018   GFRAA 67 12/01/2018     Assessment and Plan  HIV disease= will check labs to see that he is still undetectable. Will plan to refill meds to continue on biktarvy  Long term medication management = will check his Cr to see still at baseline  Seasonal allergies = will send in rx for [place] maintenance = recommend to get covid-19 vaccine  Hx of bipolar disorder = appears stable presently

## 2019-12-24 LAB — T-HELPER CELL (CD4) - (RCID CLINIC ONLY)
CD4 % Helper T Cell: 33 % (ref 33–65)
CD4 T Cell Abs: 523 /uL (ref 400–1790)

## 2019-12-25 LAB — CBC WITH DIFFERENTIAL/PLATELET
Absolute Monocytes: 676 cells/uL (ref 200–950)
Basophils Absolute: 68 cells/uL (ref 0–200)
Basophils Relative: 0.9 %
Eosinophils Absolute: 289 cells/uL (ref 15–500)
Eosinophils Relative: 3.8 %
HCT: 45.2 % (ref 38.5–50.0)
Hemoglobin: 15.7 g/dL (ref 13.2–17.1)
Lymphs Abs: 1436 cells/uL (ref 850–3900)
MCH: 33.7 pg — ABNORMAL HIGH (ref 27.0–33.0)
MCHC: 34.7 g/dL (ref 32.0–36.0)
MCV: 97 fL (ref 80.0–100.0)
MPV: 11.4 fL (ref 7.5–12.5)
Monocytes Relative: 8.9 %
Neutro Abs: 5130 cells/uL (ref 1500–7800)
Neutrophils Relative %: 67.5 %
Platelets: 222 10*3/uL (ref 140–400)
RBC: 4.66 10*6/uL (ref 4.20–5.80)
RDW: 12.9 % (ref 11.0–15.0)
Total Lymphocyte: 18.9 %
WBC: 7.6 10*3/uL (ref 3.8–10.8)

## 2019-12-25 LAB — COMPLETE METABOLIC PANEL WITH GFR
AG Ratio: 1.9 (calc) (ref 1.0–2.5)
ALT: 16 U/L (ref 9–46)
AST: 17 U/L (ref 10–40)
Albumin: 4.3 g/dL (ref 3.6–5.1)
Alkaline phosphatase (APISO): 41 U/L (ref 36–130)
BUN: 16 mg/dL (ref 7–25)
CO2: 27 mmol/L (ref 20–32)
Calcium: 9.3 mg/dL (ref 8.6–10.3)
Chloride: 104 mmol/L (ref 98–110)
Creat: 1.14 mg/dL (ref 0.60–1.35)
GFR, Est African American: 87 mL/min/{1.73_m2} (ref >=60)
GFR, Est Non African American: 75 mL/min/{1.73_m2} (ref >=60)
Globulin: 2.3 g/dL (calc) (ref 1.9–3.7)
Glucose, Bld: 81 mg/dL (ref 65–99)
Potassium: 3.8 mmol/L (ref 3.5–5.3)
Sodium: 138 mmol/L (ref 135–146)
Total Bilirubin: 0.5 mg/dL (ref 0.2–1.2)
Total Protein: 6.6 g/dL (ref 6.1–8.1)

## 2019-12-25 LAB — HIV-1 RNA QUANT-NO REFLEX-BLD
HIV 1 RNA Quant: 36 copies/mL — ABNORMAL HIGH
HIV-1 RNA Quant, Log: 1.56 Log copies/mL — ABNORMAL HIGH

## 2019-12-25 LAB — RPR: RPR Ser Ql: NONREACTIVE

## 2020-01-08 ENCOUNTER — Ambulatory Visit: Payer: BC Managed Care – PPO | Attending: Internal Medicine

## 2020-01-08 ENCOUNTER — Ambulatory Visit: Payer: Self-pay

## 2020-01-08 DIAGNOSIS — Z23 Encounter for immunization: Secondary | ICD-10-CM

## 2020-01-08 NOTE — Progress Notes (Signed)
   Covid-19 Vaccination Clinic  Name:  Ryan [name]    MRN: [id number] DOB: 1970/10/29  01/08/2020  Mr. Ryan [name] was observed post Covid-19 immunization for 15 minutes without incident. He was provided with Vaccine Information Sheet and instruction to access the V-Safe system.   Mr. Ryan [name] was instructed to call 911 with any severe reactions post vaccine: Marland Kitchen Difficulty breathing  . Swelling of face and throat  . A fast heartbeat  . A bad rash all over body  . Dizziness and weakness   Immunizations Administered    Name Date Dose VIS Date Route   Pfizer COVID-19 Vaccine 01/08/2020  9:17 AM 0.3 mL 09/17/2019 Intramuscular   Manufacturer: [place]   Lot: [id number]   [place]: [id number]

## 2020-02-02 ENCOUNTER — Ambulatory Visit: Payer: BC Managed Care – PPO | Attending: Internal Medicine

## 2020-02-02 DIAGNOSIS — Z23 Encounter for immunization: Secondary | ICD-10-CM

## 2020-02-02 NOTE — Progress Notes (Signed)
   Covid-19 Vaccination Clinic  Name:  Ryan [name]    MRN: [id number] DOB: 04/30/1971  02/02/2020  [name] was observed post Covid-19 immunization for 30 minutes based on pre-vaccination screening without incident. He was provided with Vaccine Information Sheet and instruction to access the V-Safe system.   [name] was instructed to call 911 with any severe reactions post vaccine: Marland Kitchen Difficulty breathing  . Swelling of face and throat  . A fast heartbeat  . A bad rash all over body  . Dizziness and weakness   Immunizations Administered    Name Date Dose VIS Date Route   Pfizer COVID-19 Vaccine 02/02/2020  4:39 PM 0.3 mL 12/01/2018 Intramuscular   Manufacturer: [place]   Lot: [id number]   [place]: [id number]

## 2020-06-22 ENCOUNTER — Ambulatory Visit: Payer: BC Managed Care – PPO | Admitting: Internal Medicine

## 2020-07-31 ENCOUNTER — Ambulatory Visit: Payer: BC Managed Care – PPO | Admitting: Internal Medicine

## 2020-09-26 ENCOUNTER — Other Ambulatory Visit: Payer: Self-pay | Admitting: Internal Medicine

## 2020-09-26 DIAGNOSIS — B20 Human immunodeficiency virus [HIV] disease: Secondary | ICD-10-CM

## 2020-10-28 ENCOUNTER — Other Ambulatory Visit: Payer: Self-pay | Admitting: Internal Medicine

## 2020-10-28 DIAGNOSIS — B20 Human immunodeficiency virus [HIV] disease: Secondary | ICD-10-CM

## 2020-10-30 NOTE — Telephone Encounter (Signed)
RX phoned in - e-scripts is down Spoke with patient accepted new appointment Eugenia Mcalpine

## 2020-11-15 ENCOUNTER — Other Ambulatory Visit: Payer: Self-pay

## 2020-11-15 DIAGNOSIS — B20 Human immunodeficiency virus [HIV] disease: Secondary | ICD-10-CM

## 2020-11-15 DIAGNOSIS — Z79899 Other long term (current) drug therapy: Secondary | ICD-10-CM

## 2020-11-15 DIAGNOSIS — Z113 Encounter for screening for infections with a predominantly sexual mode of transmission: Secondary | ICD-10-CM

## 2020-11-22 ENCOUNTER — Other Ambulatory Visit: Payer: BC Managed Care – PPO

## 2020-11-27 ENCOUNTER — Other Ambulatory Visit: Payer: Self-pay

## 2020-11-27 ENCOUNTER — Other Ambulatory Visit: Payer: BC Managed Care – PPO

## 2020-11-27 DIAGNOSIS — Z113 Encounter for screening for infections with a predominantly sexual mode of transmission: Secondary | ICD-10-CM

## 2020-11-27 DIAGNOSIS — Z79899 Other long term (current) drug therapy: Secondary | ICD-10-CM

## 2020-11-27 DIAGNOSIS — B20 Human immunodeficiency virus [HIV] disease: Secondary | ICD-10-CM

## 2020-11-28 LAB — T-HELPER CELL (CD4) - (RCID CLINIC ONLY)
CD4 % Helper T Cell: 33 % (ref 33–65)
CD4 T Cell Abs: 282 /uL — ABNORMAL LOW (ref 400–1790)

## 2020-11-29 LAB — CBC WITH DIFFERENTIAL/PLATELET
Absolute Monocytes: 390 cells/uL (ref 200–950)
Basophils Absolute: 113 cells/uL (ref 0–200)
Basophils Relative: 1.5 %
Eosinophils Absolute: 143 cells/uL (ref 15–500)
Eosinophils Relative: 1.9 %
HCT: 45 % (ref 38.5–50.0)
Hemoglobin: 15.4 g/dL (ref 13.2–17.1)
Lymphs Abs: 788 cells/uL — ABNORMAL LOW (ref 850–3900)
MCH: 33.3 pg — ABNORMAL HIGH (ref 27.0–33.0)
MCHC: 34.2 g/dL (ref 32.0–36.0)
MCV: 97.2 fL (ref 80.0–100.0)
MPV: 11 fL (ref 7.5–12.5)
Monocytes Relative: 5.2 %
Neutro Abs: 6068 cells/uL (ref 1500–7800)
Neutrophils Relative %: 80.9 %
Platelets: 285 10*3/uL (ref 140–400)
RBC: 4.63 10*6/uL (ref 4.20–5.80)
RDW: 12.4 % (ref 11.0–15.0)
Total Lymphocyte: 10.5 %
WBC: 7.5 10*3/uL (ref 3.8–10.8)

## 2020-11-29 LAB — LIPID PANEL
Cholesterol: 186 mg/dL (ref <200)
HDL: 48 mg/dL (ref >=40)
LDL Cholesterol (Calc): 91 mg/dL (calc)
Non-HDL Cholesterol (Calc): 138 mg/dL (calc) — ABNORMAL HIGH (ref <130)
Total CHOL/HDL Ratio: 3.9 (calc) (ref <5.0)
Triglycerides: 349 mg/dL — ABNORMAL HIGH (ref <150)

## 2020-11-29 LAB — COMPLETE METABOLIC PANEL WITH GFR
AG Ratio: 2.1 (calc) (ref 1.0–2.5)
ALT: 22 U/L (ref 9–46)
AST: 16 U/L (ref 10–35)
Albumin: 4.6 g/dL (ref 3.6–5.1)
Alkaline phosphatase (APISO): 45 U/L (ref 35–144)
BUN: 12 mg/dL (ref 7–25)
CO2: 29 mmol/L (ref 20–32)
Calcium: 9.1 mg/dL (ref 8.6–10.3)
Chloride: 103 mmol/L (ref 98–110)
Creat: 1.21 mg/dL (ref 0.70–1.33)
GFR, Est African American: 80 mL/min/{1.73_m2} (ref >=60)
GFR, Est Non African American: 69 mL/min/{1.73_m2} (ref >=60)
Globulin: 2.2 g/dL (calc) (ref 1.9–3.7)
Glucose, Bld: 95 mg/dL (ref 65–99)
Potassium: 4.2 mmol/L (ref 3.5–5.3)
Sodium: 139 mmol/L (ref 135–146)
Total Bilirubin: 0.3 mg/dL (ref 0.2–1.2)
Total Protein: 6.8 g/dL (ref 6.1–8.1)

## 2020-11-29 LAB — HIV-1 RNA QUANT-NO REFLEX-BLD
HIV 1 RNA Quant: <20 Copies/mL
HIV-1 RNA Quant, Log: <1.3 Log cps/mL

## 2020-11-29 LAB — RPR: RPR Ser Ql: NONREACTIVE

## 2020-12-04 ENCOUNTER — Ambulatory Visit: Payer: BC Managed Care – PPO | Admitting: Internal Medicine

## 2020-12-11 ENCOUNTER — Telehealth: Payer: BC Managed Care – PPO | Admitting: Internal Medicine

## 2020-12-12 ENCOUNTER — Telehealth (INDEPENDENT_AMBULATORY_CARE_PROVIDER_SITE_OTHER): Payer: BC Managed Care – PPO | Admitting: Internal Medicine

## 2020-12-12 ENCOUNTER — Other Ambulatory Visit: Payer: Self-pay

## 2020-12-12 DIAGNOSIS — E781 Pure hyperglyceridemia: Secondary | ICD-10-CM

## 2020-12-12 DIAGNOSIS — Z79899 Other long term (current) drug therapy: Secondary | ICD-10-CM | POA: Diagnosis not present

## 2020-12-12 DIAGNOSIS — B20 Human immunodeficiency virus [HIV] disease: Secondary | ICD-10-CM

## 2020-12-12 DIAGNOSIS — I10 Essential (primary) hypertension: Secondary | ICD-10-CM | POA: Diagnosis not present

## 2020-12-12 MED ORDER — AMLODIPINE BESYLATE 5 MG PO TABS: 5.0000 mg | ORAL_TABLET | Freq: Every day | ORAL | 11 refills | Status: DC

## 2020-12-12 MED ORDER — MONTELUKAST SODIUM 10 MG PO TABS: 10.0000 mg | ORAL_TABLET | Freq: Every day | ORAL | 3 refills | Status: DC

## 2020-12-12 MED ORDER — FENOFIBRATE 48 MG PO TABS: 48.0000 mg | ORAL_TABLET | Freq: Every day | ORAL | 11 refills | Status: AC

## 2020-12-12 NOTE — Progress Notes (Unsigned)
Virtual Visit via Telephone Note  I connected with Ryan [name] on 12/12/20 at 11:30 AM EST by telephone and verified that I am speaking with the correct person using two identifiers.  Location: Patient: at home Provider: at clinic   I discussed the limitations, risks, security and privacy concerns of performing an evaluation and management service by telephone and the availability of in person appointments. I also discussed with the patient that there may be a patient responsible charge related to this service. The patient expressed understanding and agreed to proceed.   History of Present Illness: 50yo M with hiv disease, bipolar disease.he reports that he is Working 3rd shift affecting his sleep. He finds it difficult to sleep in the day time-Spend time with daughter/wife, continues to do yard work   Was diagnosed with alpha gal roughly 1.5 yr ago Also has gained about 15 lb - a year  also diagnosed with RA?   Observations/Objective: Speech is fluent  Assessment and Plan:  hypertriglyceridemia = will start fenofibrate hiv disease = continue on current regimen Seasonal Allergy = will send singulair Htn= will refill amlodipine 5mg   Needs pcp Follow Up Instructions: see back in 6 months    I discussed the assessment and treatment plan with the patient. The patient was provided an opportunity to ask questions and all were answered. The patient agreed with the plan and demonstrated an understanding of the instructions.   The patient was advised to call back or seek an in-person evaluation if the symptoms worsen or if the condition fails to improve as anticipated.  I provided 10 minutes of non-face-to-face time during this encounter.   Carlyle Basques, MD

## 2021-02-06 ENCOUNTER — Other Ambulatory Visit: Payer: Self-pay | Admitting: Internal Medicine

## 2021-02-06 DIAGNOSIS — B20 Human immunodeficiency virus [HIV] disease: Secondary | ICD-10-CM

## 2021-02-07 NOTE — Telephone Encounter (Signed)
Attempted to contact patient on both numbers, unable to reach or leave vm. Patient needs 6 mo follow up with labs 2 weeks prior then we can refill x 5.

## 2021-04-02 ENCOUNTER — Ambulatory Visit: Payer: BC Managed Care – PPO | Admitting: Internal Medicine

## 2021-05-08 ENCOUNTER — Encounter (INDEPENDENT_AMBULATORY_CARE_PROVIDER_SITE_OTHER): Payer: Self-pay | Admitting: *Deleted

## 2021-05-14 ENCOUNTER — Other Ambulatory Visit: Payer: Self-pay

## 2021-05-14 DIAGNOSIS — Z79899 Other long term (current) drug therapy: Secondary | ICD-10-CM

## 2021-05-14 DIAGNOSIS — Z113 Encounter for screening for infections with a predominantly sexual mode of transmission: Secondary | ICD-10-CM

## 2021-05-14 DIAGNOSIS — B20 Human immunodeficiency virus [HIV] disease: Secondary | ICD-10-CM

## 2021-05-15 ENCOUNTER — Other Ambulatory Visit: Payer: Self-pay

## 2021-05-15 ENCOUNTER — Other Ambulatory Visit: Payer: BC Managed Care – PPO

## 2021-05-15 DIAGNOSIS — B20 Human immunodeficiency virus [HIV] disease: Secondary | ICD-10-CM

## 2021-05-15 DIAGNOSIS — Z113 Encounter for screening for infections with a predominantly sexual mode of transmission: Secondary | ICD-10-CM

## 2021-05-16 LAB — T-HELPER CELL (CD4) - (RCID CLINIC ONLY)
CD4 % Helper T Cell: 32 % — ABNORMAL LOW (ref 33–65)
CD4 T Cell Abs: 329 /uL — ABNORMAL LOW (ref 400–1790)

## 2021-05-17 LAB — CBC WITH DIFFERENTIAL/PLATELET
Absolute Monocytes: 400 cells/uL (ref 200–950)
Basophils Absolute: 83 cells/uL (ref 0–200)
Basophils Relative: 1.2 %
Eosinophils Absolute: 173 cells/uL (ref 15–500)
Eosinophils Relative: 2.5 %
HCT: 41.1 % (ref 38.5–50.0)
Hemoglobin: 13.6 g/dL (ref 13.2–17.1)
Lymphs Abs: 994 cells/uL (ref 850–3900)
MCH: 32.2 pg (ref 27.0–33.0)
MCHC: 33.1 g/dL (ref 32.0–36.0)
MCV: 97.4 fL (ref 80.0–100.0)
MPV: 11 fL (ref 7.5–12.5)
Monocytes Relative: 5.8 %
Neutro Abs: 5251 cells/uL (ref 1500–7800)
Neutrophils Relative %: 76.1 %
Platelets: 286 10*3/uL (ref 140–400)
RBC: 4.22 10*6/uL (ref 4.20–5.80)
RDW: 13 % (ref 11.0–15.0)
Total Lymphocyte: 14.4 %
WBC: 6.9 10*3/uL (ref 3.8–10.8)

## 2021-05-17 LAB — COMPLETE METABOLIC PANEL WITH GFR
AG Ratio: 1.6 (calc) (ref 1.0–2.5)
ALT: 14 U/L (ref 9–46)
AST: 13 U/L (ref 10–35)
Albumin: 4.2 g/dL (ref 3.6–5.1)
Alkaline phosphatase (APISO): 45 U/L (ref 35–144)
BUN: 16 mg/dL (ref 7–25)
CO2: 26 mmol/L (ref 20–32)
Calcium: 9.4 mg/dL (ref 8.6–10.3)
Chloride: 103 mmol/L (ref 98–110)
Creat: 1.13 mg/dL (ref 0.70–1.30)
Globulin: 2.6 g/dL (calc) (ref 1.9–3.7)
Glucose, Bld: 116 mg/dL — ABNORMAL HIGH (ref 65–99)
Potassium: 4.3 mmol/L (ref 3.5–5.3)
Sodium: 136 mmol/L (ref 135–146)
Total Bilirubin: 0.4 mg/dL (ref 0.2–1.2)
Total Protein: 6.8 g/dL (ref 6.1–8.1)
eGFR: 79 mL/min/{1.73_m2} (ref >=60)

## 2021-05-17 LAB — HIV-1 RNA QUANT-NO REFLEX-BLD
HIV 1 RNA Quant: NOT DETECTED Copies/mL
HIV-1 RNA Quant, Log: NOT DETECTED Log cps/mL

## 2021-05-17 LAB — RPR: RPR Ser Ql: NONREACTIVE

## 2021-05-24 ENCOUNTER — Telehealth: Payer: Self-pay | Admitting: Internal Medicine

## 2021-05-25 ENCOUNTER — Other Ambulatory Visit: Payer: Self-pay

## 2021-05-25 ENCOUNTER — Ambulatory Visit: Payer: BC Managed Care – PPO | Admitting: Internal Medicine

## 2021-05-26 NOTE — Telephone Encounter (Signed)
Will let patient f/u with Dr Baxter Flattery for continuity of hiv care

## 2021-05-31 ENCOUNTER — Other Ambulatory Visit: Payer: Self-pay

## 2021-05-31 ENCOUNTER — Telehealth (INDEPENDENT_AMBULATORY_CARE_PROVIDER_SITE_OTHER): Payer: BC Managed Care – PPO | Admitting: Internal Medicine

## 2021-05-31 DIAGNOSIS — R1310 Dysphagia, unspecified: Secondary | ICD-10-CM

## 2021-05-31 DIAGNOSIS — K529 Noninfective gastroenteritis and colitis, unspecified: Secondary | ICD-10-CM

## 2021-05-31 DIAGNOSIS — R1013 Epigastric pain: Secondary | ICD-10-CM | POA: Diagnosis not present

## 2021-05-31 NOTE — Progress Notes (Signed)
Virtual Visit via Telephone Note  I connected with Ryan [name] on 05/31/21 at  2:00 PM EDT by telephone and verified that I am speaking with the correct person using two identifiers.  Location: Patient: at home Provider: in clinic   I discussed the limitations, risks, security and privacy concerns of performing an evaluation and management service by telephone and the availability of in person appointments. I also discussed with the patient that there may be a patient responsible charge related to this service. The patient expressed understanding and agreed to proceed.   History of Present Illness:  He reports that he doing well with taking his medications.    Observations/Objective: Fluent speech. Does not appear to be in distress  Assessment and Plan: Hiv disease = continue on current regimen. Recommend to come to clinic for repeat of labs to check VL   Health maintenance = will need to check lipid profile; to get flu and covid booster in the Fall  Follow Up Instructions:  See assessment and plan - see back in clinic in 3-6 months I discussed the assessment and treatment plan with the patient. The patient was provided an opportunity to ask questions and all were answered. The patient agreed with the plan and demonstrated an understanding of the instructions.   The patient was advised to call back or seek an in-person evaluation if the symptoms worsen or if the condition fails to improve as anticipated.  I provided 10 minutes of non-face-to-face time during this encounter.   Carlyle Basques, MD

## 2021-06-01 ENCOUNTER — Telehealth: Payer: Self-pay

## 2021-06-01 NOTE — Telephone Encounter (Signed)
Patient walked into office today for stool kit. Was able to explain to patient how to do test. Does not have any questions on lab at this moment. Would like to know if MD could sent Rx to help with episodes of diarrhea. Is currently taking Imodium, but has not noticed much improvement.  Will forward message to MD to advise on RX for loose stool. Is currently having up to 5 episodes of loose stool. Is using [facility] in [place], [place]. Leatrice Jewels, RMA

## 2021-06-04 ENCOUNTER — Other Ambulatory Visit: Payer: Self-pay

## 2021-06-04 ENCOUNTER — Other Ambulatory Visit: Payer: BC Managed Care – PPO

## 2021-06-04 DIAGNOSIS — K529 Noninfective gastroenteritis and colitis, unspecified: Secondary | ICD-10-CM

## 2021-06-05 ENCOUNTER — Other Ambulatory Visit: Payer: Self-pay | Admitting: Internal Medicine

## 2021-06-05 MED ORDER — VANCOMYCIN HCL 125 MG PO CAPS: 125.0000 mg | ORAL_CAPSULE | Freq: Four times a day (QID) | ORAL | 0 refills | Status: DC

## 2021-06-05 NOTE — Progress Notes (Signed)
Having profuse diarrhea, tested +cdifficile. Will give a course of vanco '125mg'$  QID x 10 days.

## 2021-06-06 ENCOUNTER — Telehealth: Payer: Self-pay

## 2021-06-06 LAB — GASTROINTESTINAL PATHOGEN PANEL PCR
C. difficile Tox A/B, PCR: DETECTED — AB
Campylobacter, PCR: UNDETERMINED — AB
Cryptosporidium, PCR: UNDETERMINED — AB
E coli (ETEC) LT/ST PCR: UNDETERMINED — AB
E coli (STEC) stx1/stx2, PCR: UNDETERMINED — AB
E coli 0157, PCR: UNDETERMINED — AB
Giardia lamblia, PCR: UNDETERMINED — AB
Norovirus, PCR: UNDETERMINED — AB
Rotavirus A, PCR: UNDETERMINED — AB
Salmonella, PCR: UNDETERMINED — AB
Shigella, PCR: UNDETERMINED — AB

## 2021-06-06 LAB — C. DIFFICILE GDH AND TOXIN A/B
GDH ANTIGEN: DETECTED
MICRO NUMBER:: 12305445
SPECIMEN QUALITY:: ADEQUATE
TOXIN A AND B: NOT DETECTED

## 2021-06-06 LAB — CLOSTRIDIUM DIFFICILE TOXIN B, QUALITATIVE, REAL-TIME PCR: Toxigenic C. Difficile by PCR: DETECTED — AB

## 2021-06-06 NOTE — Telephone Encounter (Signed)
[facility] called to relay positive C.diff test. Dr. Baxter Flattery aware and has prescribed oral vancomycin. Left HIPAA compliant voicemail requesting callback to make patient aware. Will send MyChart message.   Beryle Flock, RN

## 2021-06-28 ENCOUNTER — Other Ambulatory Visit (HOSPITAL_COMMUNITY): Payer: Self-pay

## 2021-06-29 ENCOUNTER — Telehealth: Payer: Self-pay

## 2021-06-29 ENCOUNTER — Other Ambulatory Visit (HOSPITAL_COMMUNITY): Payer: Self-pay

## 2021-06-29 NOTE — Telephone Encounter (Signed)
[facility] Patient Advocate Encounter   Was successful in obtaining a [place] copay card for [employer].  This copay card will make the patients copay 0.00.  I have spoken with the patient.    The billing information is as follows and has been shared with [facility].      Ileene Patrick, [facility] Patient [facility] Phone: [phone] Fax:  [phone]

## 2021-07-02 ENCOUNTER — Other Ambulatory Visit (HOSPITAL_COMMUNITY): Payer: Self-pay

## 2021-07-02 NOTE — Telephone Encounter (Signed)
I called patient and had to leave a message.

## 2021-09-13 ENCOUNTER — Telehealth (INDEPENDENT_AMBULATORY_CARE_PROVIDER_SITE_OTHER): Payer: Self-pay

## 2021-09-13 ENCOUNTER — Other Ambulatory Visit: Payer: Self-pay

## 2021-09-13 ENCOUNTER — Other Ambulatory Visit (INDEPENDENT_AMBULATORY_CARE_PROVIDER_SITE_OTHER): Payer: Self-pay

## 2021-09-13 ENCOUNTER — Ambulatory Visit (INDEPENDENT_AMBULATORY_CARE_PROVIDER_SITE_OTHER): Payer: BC Managed Care – PPO | Admitting: Gastroenterology

## 2021-09-13 ENCOUNTER — Encounter (INDEPENDENT_AMBULATORY_CARE_PROVIDER_SITE_OTHER): Payer: Self-pay | Admitting: Gastroenterology

## 2021-09-13 ENCOUNTER — Encounter (INDEPENDENT_AMBULATORY_CARE_PROVIDER_SITE_OTHER): Payer: Self-pay

## 2021-09-13 VITALS — BP 130/82 | HR 76 | Temp 98.2°F | Ht 70.0 in | Wt 197.4 lb

## 2021-09-13 DIAGNOSIS — R1310 Dysphagia, unspecified: Secondary | ICD-10-CM | POA: Insufficient documentation

## 2021-09-13 DIAGNOSIS — K219 Gastro-esophageal reflux disease without esophagitis: Secondary | ICD-10-CM

## 2021-09-13 DIAGNOSIS — R1319 Other dysphagia: Secondary | ICD-10-CM | POA: Diagnosis not present

## 2021-09-13 DIAGNOSIS — R140 Abdominal distension (gaseous): Secondary | ICD-10-CM

## 2021-09-13 DIAGNOSIS — F1911 Other psychoactive substance abuse, in remission: Secondary | ICD-10-CM

## 2021-09-13 MED ORDER — PEG 3350-KCL-NA BICARB-NACL 420 G PO SOLR: 4000.0000 mL | ORAL | 0 refills | Status: DC

## 2021-09-13 NOTE — Patient Instructions (Signed)
Schedule EGD and colonoscopy Start omeprazole 20 mg qday Explained presumed etiology of reflux symptoms. Instruction provided in the use of antireflux medication - patient should take medication in the morning 30-45 minutes before eating breakfast. Discussed avoidance of eating within 2 hours of lying down to sleep and benefit of blocks to elevate head of bed. Also, will benefit from avoiding carbonated drinks/sodas or food that has tomatoes, spicy or greasy food. Patient was counseled about the benefit of implementing a low FODMAP to improve symptoms and recurrent episodes. A dietary list was provided to the patient.  Can take edible peppermint as needed for bloating, but this may cause worsening heartburn Perform blood workup

## 2021-09-13 NOTE — Telephone Encounter (Signed)
Ryan [name], CMA  

## 2021-09-13 NOTE — Progress Notes (Signed)
[name], M.D. Gastroenterology & Hepatology [facility] [address] [place],  [address] Primary Care Physician: Wilburt Finlay, MD [place] [address]  Referring MD: Dione Housekeeper, MD  Chief Complaint:  dysphagia, heartburn, bloating  History of Present Illness: Ryan [name] is a 50 y.o. male with PM c. Diff x1, alpha gal allergy, HIV, bipolar disorder, hypertension, depression, who presents for evaluation of dysphagia, bloating and heartburn.  Patient reports that he has presented recurrent episodes of dysphagia to solid food such a as chicken, beef, tuna or bread. He describes for the last 6 months it has been present more frequently and has made him concerned. He describes he feels the food sits in the lower part of his chest and slowly goes down. Sometimes he has to regurgitate the food, which has happened 2-3 times per week. No issues with liquids. He can have some pain when he swallows in the middle of his chest. He reports a frequent sensation of regurgitation for the last 2 years as well as heartburn on a dialy basis. He takes OTC omeprazole 20 mg as needed, which he feels sometimes helps improving the heartburn.  Endorsed some diarrhea in the past but now has it very seldom.  Has also presented recurrent episodes of bloating after eating for the last 2 years. Sometimes he can have bloating without eating. He also reports having frequent nausea with occasional episodes of vomiting. Has gained 25 lb in the last 3 years. The patient denies having any fever, chills, hematochezia, melena, hematemesis, abdominal distention, abdominal pain,  jaundice, pruritus.  States he had a Cologuard ordered but never had it done  Last FBP:ZWCHE Last Colonoscopy:never  FHx: neg for any gastrointestinal/liver disease, father had esophageal cancer, sister had cervix cancer Social: smokes 5 cigs per day, drinks 8 drinks  of vodka per wek, patient reports smoking marijuana in the past Surgical: no abdominal surgeries  Past Medical History: Past Medical History:  Diagnosis Date   Bipolar disorder ([place])    Depressed    H/O: substance abuse ([place]) 06/09/2013   Crack cocaine,  marijuana   HIV (human immunodeficiency virus infection) ([place])    Hypertension     Past Surgical History: Past Surgical History:  Procedure Laterality Date   FINGER SURGERY      Family History: Family History  Problem Relation Age of Onset   Esophageal cancer Father     Social History: Social History   Tobacco Use  Smoking Status Every Day   Packs/day: 0.25   Types: Cigarettes  Smokeless Tobacco Never   Social History   Substance and Sexual Activity  Alcohol Use Yes   Comment: occasionally   Social History   Substance and Sexual Activity  Drug Use No   Types: Cocaine, Marijuana, Other-see comments   Comment: presription pills - history but has not done any in 1 year    Allergies: Allergies  Allergen Reactions   Levaquin [Levofloxacin In D5w] Other (See Comments)    Pain & nerve damage in fingers   Other     Red meat   Sulfa Antibiotics Rash    Medications: Current Outpatient Medications  Medication Sig Dispense Refill   acetaminophen (TYLENOL) 325 MG tablet Take 2 tablets (650 mg total) by mouth every 6 (six) hours as needed.     amLODipine (NORVASC) 5 MG tablet Take 1 tablet (5 mg total) by mouth daily. 30 tablet 11   BIKTARVY 50-200-25  MG TABS tablet Take 1 tablet by mouth once daily 30 tablet 5   fenofibrate (TRICOR) 48 MG tablet Take 1 tablet (48 mg total) by mouth daily. 30 tablet 11   lithium 300 MG tablet Take 300 mg by mouth daily.     QUEtiapine (SEROQUEL XR) 400 MG 24 hr tablet Take 100 mg by mouth at bedtime.      No current facility-administered medications for this visit.    Review of Systems: GENERAL: negative for malaise, night sweats HEENT: No changes in hearing or vision, no  nose bleeds or other nasal problems. NECK: Negative for lumps, goiter, pain and significant neck swelling RESPIRATORY: Negative for cough, wheezing CARDIOVASCULAR: Negative for chest pain, leg swelling, palpitations, orthopnea GI: SEE HPI MUSCULOSKELETAL: Negative for joint pain or swelling, back pain, and muscle pain. SKIN: Negative for lesions, rash PSYCH: Negative for sleep disturbance, mood disorder and recent psychosocial stressors. HEMATOLOGY Negative for prolonged bleeding, bruising easily, and swollen nodes. ENDOCRINE: Negative for cold or heat intolerance, polyuria, polydipsia and goiter. NEURO: negative for tremor, gait imbalance, syncope and seizures. The remainder of the review of systems is noncontributory.   Physical Exam: BP 130/82 (BP Location: Left Arm, Patient Position: Sitting, Cuff Size: Large)   Pulse 76   Temp 98.2 F (36.8 C) (Oral)   Ht 5\' 10"  (1.778 m)   Wt 197 lb 6.4 oz (89.5 kg)   BMI 28.32 kg/m  GENERAL: The patient is AO x3, in no acute distress. HEENT: Head is normocephalic and atraumatic. EOMI are intact. Mouth is well hydrated and without lesions. NECK: Supple. No masses LUNGS: Clear to auscultation. No presence of rhonchi/wheezing/rales. Adequate chest expansion HEART: RRR, normal s1 and s2. ABDOMEN: Soft, nontender, no guarding, no peritoneal signs, and nondistended. BS +. No masses. EXTREMITIES: Without any cyanosis, clubbing, rash, lesions or edema. NEUROLOGIC: AOx3, no focal motor deficit. SKIN: no jaundice, no rashes   Imaging/Labs: as above  I personally reviewed and interpreted the available labs, imaging and endoscopic files.  Impression and Plan: Ryan [name] is a 50 y.o. male with PM c. Diff x1, alpha gal allergy, HIV, bipolar disorder, hypertension, depression, who presents for evaluation of dysphagia, bloating and heartburn.  The patient has presented recurrent dysphagia to solids that needs to be further evaluated with an EGD.   He has a family history of esophageal cancer which increases his overall risk for his malignancy; he also has presence of symptoms suggestive of GERD.  This further supports the need for an EGD.  Notably, he does not have any red flag signs and that is reassuring.  He has taken PPI in the past with relief of the symptoms but given the frequency of his episodes I advised the patient to take omeprazole on a daily basis which will lead to persistent suppression of his acid production.  Patient understood and agreed.  Can also benefit from pending lifestyle modifications.  He has presented some episodes of bloating of unclear etiology.    We will check for celiac serology and TSH.  He may implement a low FODMAP diet as his symptoms may be related to IBS.  Finally, he is due for colorectal cancer screening, will schedule a colonoscopy.  - Schedule EGD and colonoscopy -Start omeprazole 20 mg qday -Explained presumed etiology of reflux symptoms. Instruction provided in the use of antireflux medication - patient should take medication in the morning 30-45 minutes before eating breakfast. Discussed avoidance of eating within 2 hours of lying down  to sleep and benefit of blocks to elevate head of bed. Also, will benefit from avoiding carbonated drinks/sodas or food that has tomatoes, spicy or greasy food. -Patient was counseled about the benefit of implementing a low FODMAP to improve symptoms and recurrent episodes. A dietary list was provided to the patient.  -Can take edible peppermint as needed for bloating, but this may cause worsening heartburn -Check TSH and celiac serology  All questions were answered.      [name], MD Gastroenterology and Hepatology [facility]

## 2021-09-13 NOTE — H&P (View-Only) (Signed)
[name], M.D. Gastroenterology & Hepatology [facility] [address] [place], [place] [address] Primary Care Physician: Wilburt Finlay, MD [place] [address]  Referring MD: Dione Housekeeper, MD  Chief Complaint:  dysphagia, heartburn, bloating  History of Present Illness: Ryan [name] is a 50 y.o. male with PM c. Diff x1, alpha gal allergy, HIV, bipolar disorder, hypertension, depression, who presents for evaluation of dysphagia, bloating and heartburn.  Patient reports that he has presented recurrent episodes of dysphagia to solid food such a as chicken, beef, tuna or bread. He describes for the last 6 months it has been present more frequently and has made him concerned. He describes he feels the food sits in the lower part of his chest and slowly goes down. Sometimes he has to regurgitate the food, which has happened 2-3 times per week. No issues with liquids. He can have some pain when he swallows in the middle of his chest. He reports a frequent sensation of regurgitation for the last 2 years as well as heartburn on a dialy basis. He takes OTC omeprazole 20 mg as needed, which he feels sometimes helps improving the heartburn.  Endorsed some diarrhea in the past but now has it very seldom.  Has also presented recurrent episodes of bloating after eating for the last 2 years. Sometimes he can have bloating without eating. He also reports having frequent nausea with occasional episodes of vomiting. Has gained 25 lb in the last 3 years. The patient denies having any fever, chills, hematochezia, melena, hematemesis, abdominal distention, abdominal pain,  jaundice, pruritus.  States he had a Cologuard ordered but never had it done  Last ERX:VQMGQ Last Colonoscopy:never  FHx: neg for any gastrointestinal/liver disease, father had esophageal cancer, sister had cervix cancer Social: smokes 5 cigs per day, drinks 8 drinks  of vodka per wek, patient reports smoking marijuana in the past Surgical: no abdominal surgeries  Past Medical History: Past Medical History:  Diagnosis Date   Bipolar disorder ([place])    Depressed    H/O: substance abuse ([place]) 06/09/2013   Crack cocaine,  marijuana   HIV (human immunodeficiency virus infection) ([place])    Hypertension     Past Surgical History: Past Surgical History:  Procedure Laterality Date   FINGER SURGERY      Family History: Family History  Problem Relation Age of Onset   Esophageal cancer Father     Social History: Social History   Tobacco Use  Smoking Status Every Day   Packs/day: 0.25   Types: Cigarettes  Smokeless Tobacco Never   Social History   Substance and Sexual Activity  Alcohol Use Yes   Comment: occasionally   Social History   Substance and Sexual Activity  Drug Use No   Types: Cocaine, Marijuana, Other-see comments   Comment: presription pills - history but has not done any in 1 year    Allergies: Allergies  Allergen Reactions   Levaquin [Levofloxacin In D5w] Other (See Comments)    Pain & nerve damage in fingers   Other     Red meat   Sulfa Antibiotics Rash    Medications: Current Outpatient Medications  Medication Sig Dispense Refill   acetaminophen (TYLENOL) 325 MG tablet Take 2 tablets (650 mg total) by mouth every 6 (six) hours as needed.     amLODipine (NORVASC) 5 MG tablet Take 1 tablet (5 mg total) by mouth daily. 30 tablet 11   BIKTARVY 50-200-25  MG TABS tablet Take 1 tablet by mouth once daily 30 tablet 5   fenofibrate (TRICOR) 48 MG tablet Take 1 tablet (48 mg total) by mouth daily. 30 tablet 11   lithium 300 MG tablet Take 300 mg by mouth daily.     QUEtiapine (SEROQUEL XR) 400 MG 24 hr tablet Take 100 mg by mouth at bedtime.      No current facility-administered medications for this visit.    Review of Systems: GENERAL: negative for malaise, night sweats HEENT: No changes in hearing or vision, no  nose bleeds or other nasal problems. NECK: Negative for lumps, goiter, pain and significant neck swelling RESPIRATORY: Negative for cough, wheezing CARDIOVASCULAR: Negative for chest pain, leg swelling, palpitations, orthopnea GI: SEE HPI MUSCULOSKELETAL: Negative for joint pain or swelling, back pain, and muscle pain. SKIN: Negative for lesions, rash PSYCH: Negative for sleep disturbance, mood disorder and recent psychosocial stressors. HEMATOLOGY Negative for prolonged bleeding, bruising easily, and swollen nodes. ENDOCRINE: Negative for cold or heat intolerance, polyuria, polydipsia and goiter. NEURO: negative for tremor, gait imbalance, syncope and seizures. The remainder of the review of systems is noncontributory.   Physical Exam: BP 130/82 (BP Location: Left Arm, Patient Position: Sitting, Cuff Size: Large)    Pulse 76    Temp 98.2 F (36.8 C) (Oral)    Ht 5\' 10"  (1.778 m)    Wt 197 lb 6.4 oz (89.5 kg)    BMI 28.32 kg/m  GENERAL: The patient is AO x3, in no acute distress. HEENT: Head is normocephalic and atraumatic. EOMI are intact. Mouth is well hydrated and without lesions. NECK: Supple. No masses LUNGS: Clear to auscultation. No presence of rhonchi/wheezing/rales. Adequate chest expansion HEART: RRR, normal s1 and s2. ABDOMEN: Soft, nontender, no guarding, no peritoneal signs, and nondistended. BS +. No masses. EXTREMITIES: Without any cyanosis, clubbing, rash, lesions or edema. NEUROLOGIC: AOx3, no focal motor deficit. SKIN: no jaundice, no rashes   Imaging/Labs: as above  I personally reviewed and interpreted the available labs, imaging and endoscopic files.  Impression and Plan: Ryan [name] is a 50 y.o. male with PM c. Diff x1, alpha gal allergy, HIV, bipolar disorder, hypertension, depression, who presents for evaluation of dysphagia, bloating and heartburn.  The patient has presented recurrent dysphagia to solids that needs to be further evaluated with an EGD.   He has a family history of esophageal cancer which increases his overall risk for his malignancy; he also has presence of symptoms suggestive of GERD.  This further supports the need for an EGD.  Notably, he does not have any red flag signs and that is reassuring.  He has taken PPI in the past with relief of the symptoms but given the frequency of his episodes I advised the patient to take omeprazole on a daily basis which will lead to persistent suppression of his acid production.  Patient understood and agreed.  Can also benefit from pending lifestyle modifications.  He has presented some episodes of bloating of unclear etiology.    We will check for celiac serology and TSH.  He may implement a low FODMAP diet as his symptoms may be related to IBS.  Finally, he is due for colorectal cancer screening, will schedule a colonoscopy.  - Schedule EGD and colonoscopy -Start omeprazole 20 mg qday -Explained presumed etiology of reflux symptoms. Instruction provided in the use of antireflux medication - patient should take medication in the morning 30-45 minutes before eating breakfast. Discussed avoidance of eating within  2 hours of lying down to sleep and benefit of blocks to elevate head of bed. Also, will benefit from avoiding carbonated drinks/sodas or food that has tomatoes, spicy or greasy food. -Patient was counseled about the benefit of implementing a low FODMAP to improve symptoms and recurrent episodes. A dietary list was provided to the patient.  -Can take edible peppermint as needed for bloating, but this may cause worsening heartburn -Check TSH and celiac serology  All questions were answered.      [name], MD Gastroenterology and Hepatology [facility]

## 2021-09-14 ENCOUNTER — Encounter (INDEPENDENT_AMBULATORY_CARE_PROVIDER_SITE_OTHER): Payer: Self-pay

## 2021-09-14 LAB — CELIAC DISEASE PANEL
(tTG) Ab, IgA: <1 U/mL
(tTG) Ab, IgG: <1 U/mL
Gliadin IgA: 1.1 U/mL
Gliadin IgG: 4.9 U/mL
Immunoglobulin A: 179 mg/dL (ref 47–310)

## 2021-09-14 LAB — TSH: TSH: 2.87 mIU/L (ref 0.40–4.50)

## 2021-09-14 NOTE — Patient Instructions (Signed)
Ryan [name]  09/14/2021     @PREFPERIOPPHARMACY @   Your procedure is scheduled on 09/21/2021.   Report to [facility] at  0930 AM.   Call this number if you have problems the morning of surgery:  [phone]   Remember:  Follow the diet and prep instructions given to you by the office.    Take these medicines the morning of surgery with A SIP OF WATER                             biktarvy, amlodipine, lithium.     Do not wear jewelry, make-up or nail polish.  Do not wear lotions, powders, or perfumes, or deodorant.  Do not shave 48 hours prior to surgery.  Men may shave face and neck.  Do not bring valuables to the hospital.  [facility] is not responsible for any belongings or valuables.  Contacts, dentures or bridgework may not be worn into surgery.  Leave your suitcase in the car.  After surgery it may be brought to your room.  For patients admitted to the hospital, discharge time will be determined by your treatment team.  Patients discharged the day of surgery will not be allowed to drive home and must  have someone with them for 24 hours.    Special instructions:   DO NOT smoke tobacco or vape for 24 hours before your procedure.  Please read over the following fact sheets that you were given. Anesthesia Post-op Instructions and Care and Recovery After Surgery      Upper Endoscopy, Adult, Care After This sheet gives you information about how to care for yourself after your procedure. Your health care provider may also give you more specific instructions. If you have problems or questions, contact your health care provider. What can I expect after the procedure? After the procedure, it is common to have: A sore throat. Mild stomach pain or discomfort. Bloating. Nausea. Follow these instructions at home:  Follow instructions from your health care provider about what to eat or drink after your procedure. Return to your normal activities as told  by your health care provider. Ask your health care provider what activities are safe for you. Take over-the-counter and prescription medicines only as told by your health care provider. If you were given a sedative during the procedure, it can affect you for several hours. Do not drive or operate machinery until your health care provider says that it is safe. Keep all follow-up visits as told by your health care provider. This is important. Contact a health care provider if you have: A sore throat that lasts longer than one day. Trouble swallowing. Get help right away if: You vomit blood or your vomit looks like coffee grounds. You have: A fever. Bloody, black, or tarry stools. A severe sore throat or you cannot swallow. Difficulty breathing. Severe pain in your chest or abdomen. Summary After the procedure, it is common to have a sore throat, mild stomach discomfort, bloating, and nausea. If you were given a sedative during the procedure, it can affect you for several hours. Do not drive or operate machinery until your health care provider says that it is safe. Follow instructions from your health care provider about what to eat or drink after your procedure. Return to your normal activities as told by your health care provider. This information is not intended to replace advice  given to you by your health care provider. Make sure you discuss any questions you have with your health care provider. Document Revised: 07/30/2019 Document Reviewed: 02/23/2018 Elsevier Patient Education  2022 [place]. Colonoscopy, Adult, Care After This sheet gives you information about how to care for yourself after your procedure. Your health care provider may also give you more specific instructions. If you have problems or questions, contact your health care provider. What can I expect after the procedure? After the procedure, it is common to have: A small amount of blood in your stool for 24 hours  after the procedure. Some gas. Mild cramping or bloating of your abdomen. Follow these instructions at home: Eating and drinking  Drink enough fluid to keep your urine pale yellow. Follow instructions from your health care provider about eating or drinking restrictions. Resume your normal diet as instructed by your health care provider. Avoid heavy or fried foods that are hard to digest. Activity Rest as told by your health care provider. Avoid sitting for a long time without moving. Get up to take short walks every 1-2 hours. This is important to improve blood flow and breathing. Ask for help if you feel weak or unsteady. Return to your normal activities as told by your health care provider. Ask your health care provider what activities are safe for you. Managing cramping and bloating  Try walking around when you have cramps or feel bloated. Apply heat to your abdomen as told by your health care provider. Use the heat source that your health care provider recommends, such as a moist heat pack or a heating pad. Place a towel between your skin and the heat source. Leave the heat on for 20-30 minutes. Remove the heat if your skin turns bright red. This is especially important if you are unable to feel pain, heat, or cold. You may have a greater risk of getting burned. General instructions If you were given a sedative during the procedure, it can affect you for several hours. Do not drive or operate machinery until your health care provider says that it is safe. For the first 24 hours after the procedure: Do not sign important documents. Do not drink alcohol. Do your regular daily activities at a slower pace than normal. Eat soft foods that are easy to digest. Take over-the-counter and prescription medicines only as told by your health care provider. Keep all follow-up visits as told by your health care provider. This is important. Contact a health care provider if: You have blood in your  stool 2-3 days after the procedure. Get help right away if you have: More than a small spotting of blood in your stool. Large blood clots in your stool. Swelling of your abdomen. Nausea or vomiting. A fever. Increasing pain in your abdomen that is not relieved with medicine. Summary After the procedure, it is common to have a small amount of blood in your stool. You may also have mild cramping and bloating of your abdomen. If you were given a sedative during the procedure, it can affect you for several hours. Do not drive or operate machinery until your health care provider says that it is safe. Get help right away if you have a lot of blood in your stool, nausea or vomiting, a fever, or increased pain in your abdomen. This information is not intended to replace advice given to you by your health care provider. Make sure you discuss any questions you have with your health care provider. Document  Revised: 07/30/2019 Document Reviewed: 04/19/2019 Elsevier Patient Education  [place] After This sheet gives you information about how to care for yourself after your procedure. Your health care provider may also give you more specific instructions. If you have problems or questions, contact your health care provider. What can I expect after the procedure? After the procedure, it is common to have: Tiredness. Forgetfulness about what happened after the procedure. Impaired judgment for important decisions. Nausea or vomiting. Some difficulty with balance. Follow these instructions at home: For the time period you were told by your health care provider:   Rest as needed. Do not participate in activities where you could fall or become injured. Do not drive or use machinery. Do not drink alcohol. Do not take sleeping pills or medicines that cause drowsiness. Do not make important decisions or sign legal documents. Do not take care of children on your  own. Eating and drinking Follow the diet that is recommended by your health care provider. Drink enough fluid to keep your urine pale yellow. If you vomit: Drink water, juice, or soup when you can drink without vomiting. Make sure you have little or no nausea before eating solid foods. General instructions Have a responsible adult stay with you for the time you are told. It is important to have someone help care for you until you are awake and alert. Take over-the-counter and prescription medicines only as told by your health care provider. If you have sleep apnea, surgery and certain medicines can increase your risk for breathing problems. Follow instructions from your health care provider about wearing your sleep device: Anytime you are sleeping, including during daytime naps. While taking prescription pain medicines, sleeping medicines, or medicines that make you drowsy. Avoid smoking. Keep all follow-up visits as told by your health care provider. This is important. Contact a health care provider if: You keep feeling nauseous or you keep vomiting. You feel light-headed. You are still sleepy or having trouble with balance after 24 hours. You develop a rash. You have a fever. You have redness or swelling around the IV site. Get help right away if: You have trouble breathing. You have new-onset confusion at home. Summary For several hours after your procedure, you may feel tired. You may also be forgetful and have poor judgment. Have a responsible adult stay with you for the time you are told. It is important to have someone help care for you until you are awake and alert. Rest as told. Do not drive or operate machinery. Do not drink alcohol or take sleeping pills. Get help right away if you have trouble breathing, or if you suddenly become confused. This information is not intended to replace advice given to you by your health care provider. Make sure you discuss any questions you  have with your health care provider. Document Revised: 06/08/2020 Document Reviewed: 08/26/2019 Elsevier Patient Education  2022 [employer].

## 2021-09-18 ENCOUNTER — Other Ambulatory Visit: Payer: Self-pay

## 2021-09-18 ENCOUNTER — Encounter (HOSPITAL_COMMUNITY)
Admission: RE | Admit: 2021-09-18 | Discharge: 2021-09-18 | Disposition: A | Discharge status: Home / Self Care | Payer: BC Managed Care – PPO | Source: Ambulatory Visit | Attending: Gastroenterology | Admitting: Gastroenterology

## 2021-09-18 ENCOUNTER — Encounter (HOSPITAL_COMMUNITY): Payer: Self-pay

## 2021-09-18 ENCOUNTER — Other Ambulatory Visit (HOSPITAL_COMMUNITY): Payer: BC Managed Care – PPO

## 2021-09-18 DIAGNOSIS — R1310 Dysphagia, unspecified: Secondary | ICD-10-CM | POA: Diagnosis not present

## 2021-09-18 DIAGNOSIS — K2100 Gastro-esophageal reflux disease with esophagitis, without bleeding: Secondary | ICD-10-CM | POA: Diagnosis not present

## 2021-09-18 DIAGNOSIS — K3189 Other diseases of stomach and duodenum: Secondary | ICD-10-CM | POA: Diagnosis not present

## 2021-09-18 DIAGNOSIS — Z1211 Encounter for screening for malignant neoplasm of colon: Secondary | ICD-10-CM | POA: Diagnosis not present

## 2021-09-18 DIAGNOSIS — Z800 Family history of malignant neoplasm of digestive organs: Secondary | ICD-10-CM | POA: Diagnosis not present

## 2021-09-18 DIAGNOSIS — F1911 Other psychoactive substance abuse, in remission: Secondary | ICD-10-CM | POA: Insufficient documentation

## 2021-09-18 DIAGNOSIS — K2980 Duodenitis without bleeding: Secondary | ICD-10-CM | POA: Diagnosis not present

## 2021-09-18 DIAGNOSIS — K648 Other hemorrhoids: Secondary | ICD-10-CM | POA: Diagnosis not present

## 2021-09-18 DIAGNOSIS — Z01818 Encounter for other preprocedural examination: Secondary | ICD-10-CM | POA: Insufficient documentation

## 2021-09-18 DIAGNOSIS — K5730 Diverticulosis of large intestine without perforation or abscess without bleeding: Secondary | ICD-10-CM | POA: Diagnosis not present

## 2021-09-18 DIAGNOSIS — Z21 Asymptomatic human immunodeficiency virus [HIV] infection status: Secondary | ICD-10-CM | POA: Diagnosis not present

## 2021-09-18 DIAGNOSIS — K222 Esophageal obstruction: Secondary | ICD-10-CM | POA: Diagnosis not present

## 2021-09-18 DIAGNOSIS — K449 Diaphragmatic hernia without obstruction or gangrene: Secondary | ICD-10-CM | POA: Diagnosis not present

## 2021-09-18 DIAGNOSIS — F17210 Nicotine dependence, cigarettes, uncomplicated: Secondary | ICD-10-CM | POA: Diagnosis not present

## 2021-09-18 DIAGNOSIS — I10 Essential (primary) hypertension: Secondary | ICD-10-CM | POA: Diagnosis not present

## 2021-09-18 HISTORY — DX: Gastro-esophageal reflux disease without esophagitis: K21.9

## 2021-09-18 LAB — RAPID URINE DRUG SCREEN, HOSP PERFORMED
Amphetamines: NOT DETECTED
Barbiturates: NOT DETECTED
Benzodiazepines: NOT DETECTED
Cocaine: NOT DETECTED
Opiates: NOT DETECTED
Tetrahydrocannabinol: NOT DETECTED

## 2021-09-21 ENCOUNTER — Ambulatory Visit (HOSPITAL_COMMUNITY): Payer: BC Managed Care – PPO | Admitting: Anesthesiology

## 2021-09-21 ENCOUNTER — Encounter (HOSPITAL_COMMUNITY): Payer: Self-pay | Admitting: Gastroenterology

## 2021-09-21 ENCOUNTER — Encounter (HOSPITAL_COMMUNITY)
Admission: RE | Disposition: A | Discharge status: Home / Self Care | Payer: Self-pay | Source: Home / Self Care | Attending: Gastroenterology

## 2021-09-21 ENCOUNTER — Ambulatory Visit (HOSPITAL_COMMUNITY)
Admission: RE | Admit: 2021-09-21 | Discharge: 2021-09-21 | Disposition: A | Discharge status: Home / Self Care | Payer: BC Managed Care – PPO | Attending: Gastroenterology | Admitting: Gastroenterology

## 2021-09-21 DIAGNOSIS — K5730 Diverticulosis of large intestine without perforation or abscess without bleeding: Secondary | ICD-10-CM | POA: Diagnosis not present

## 2021-09-21 DIAGNOSIS — K648 Other hemorrhoids: Secondary | ICD-10-CM | POA: Diagnosis not present

## 2021-09-21 DIAGNOSIS — D122 Benign neoplasm of ascending colon: Secondary | ICD-10-CM | POA: Diagnosis not present

## 2021-09-21 DIAGNOSIS — Z1211 Encounter for screening for malignant neoplasm of colon: Secondary | ICD-10-CM | POA: Diagnosis not present

## 2021-09-21 DIAGNOSIS — K449 Diaphragmatic hernia without obstruction or gangrene: Secondary | ICD-10-CM | POA: Diagnosis not present

## 2021-09-21 DIAGNOSIS — K2980 Duodenitis without bleeding: Secondary | ICD-10-CM | POA: Insufficient documentation

## 2021-09-21 DIAGNOSIS — R1310 Dysphagia, unspecified: Secondary | ICD-10-CM | POA: Insufficient documentation

## 2021-09-21 DIAGNOSIS — K2100 Gastro-esophageal reflux disease with esophagitis, without bleeding: Secondary | ICD-10-CM | POA: Diagnosis not present

## 2021-09-21 DIAGNOSIS — K222 Esophageal obstruction: Secondary | ICD-10-CM | POA: Diagnosis not present

## 2021-09-21 DIAGNOSIS — K3189 Other diseases of stomach and duodenum: Secondary | ICD-10-CM | POA: Insufficient documentation

## 2021-09-21 DIAGNOSIS — I10 Essential (primary) hypertension: Secondary | ICD-10-CM | POA: Insufficient documentation

## 2021-09-21 DIAGNOSIS — Z800 Family history of malignant neoplasm of digestive organs: Secondary | ICD-10-CM | POA: Insufficient documentation

## 2021-09-21 DIAGNOSIS — F17210 Nicotine dependence, cigarettes, uncomplicated: Secondary | ICD-10-CM | POA: Insufficient documentation

## 2021-09-21 DIAGNOSIS — Z21 Asymptomatic human immunodeficiency virus [HIV] infection status: Secondary | ICD-10-CM | POA: Insufficient documentation

## 2021-09-21 DIAGNOSIS — F1911 Other psychoactive substance abuse, in remission: Secondary | ICD-10-CM

## 2021-09-21 HISTORY — PX: POLYPECTOMY: SHX5525

## 2021-09-21 HISTORY — PX: BALLOON DILATION: SHX5330

## 2021-09-21 HISTORY — PX: BIOPSY: SHX5522

## 2021-09-21 HISTORY — PX: ESOPHAGOGASTRODUODENOSCOPY (EGD) WITH PROPOFOL: SHX5813

## 2021-09-21 HISTORY — PX: COLONOSCOPY WITH PROPOFOL: SHX5780

## 2021-09-21 LAB — HM COLONOSCOPY

## 2021-09-21 SURGERY — COLONOSCOPY WITH PROPOFOL
Anesthesia: General

## 2021-09-21 MED ORDER — SODIUM CHLORIDE 0.9 % IV SOLN: INTRAVENOUS | Status: DC

## 2021-09-21 MED ORDER — PROPOFOL 500 MG/50ML IV EMUL
INTRAVENOUS | Status: DC | PRN
  Administered 2021-09-21: 150 ug/kg/min via INTRAVENOUS

## 2021-09-21 MED ORDER — LACTATED RINGERS IV SOLN: INTRAVENOUS | Status: DC

## 2021-09-21 MED ORDER — OMEPRAZOLE 40 MG PO CPDR: 40.0000 mg | DELAYED_RELEASE_CAPSULE | Freq: Every day | ORAL | 3 refills | Status: DC

## 2021-09-21 MED ORDER — PROPOFOL 10 MG/ML IV BOLUS
INTRAVENOUS | Status: DC | PRN
  Administered 2021-09-21: 100 mg via INTRAVENOUS
  Administered 2021-09-21: 30 mg via INTRAVENOUS
  Administered 2021-09-21: 50 mg via INTRAVENOUS
  Administered 2021-09-21: 20 mg via INTRAVENOUS
  Administered 2021-09-21 (×2): 40 mg via INTRAVENOUS

## 2021-09-21 NOTE — Transfer of Care (Signed)
Immediate Anesthesia Transfer of Care Note  Patient: Ryan [name]  Procedure(s) Performed: COLONOSCOPY WITH PROPOFOL ESOPHAGOGASTRODUODENOSCOPY (EGD) WITH PROPOFOL BIOPSY BALLOON DILATION POLYPECTOMY  Patient Location: Short Stay  Anesthesia Type:General  Level of Consciousness: awake  Airway & Oxygen Therapy: Patient Spontanous Breathing  Post-op Assessment: Report given to RN  Post vital signs: Reviewed and stable  Last Vitals:  Vitals Value Taken Time  BP 96/56 09/21/21 1150  Temp 36.4 C 09/21/21 1150  Pulse 66 09/21/21 1150  Resp 16 09/21/21 1150  SpO2 98 % 09/21/21 1150    Last Pain:  Vitals:   09/21/21 1150  TempSrc: Oral  PainSc: 0-No pain         Complications: No notable events documented.

## 2021-09-21 NOTE — Anesthesia Postprocedure Evaluation (Signed)
Anesthesia Post Note  Patient: Ryan [name]  Procedure(s) Performed: COLONOSCOPY WITH PROPOFOL ESOPHAGOGASTRODUODENOSCOPY (EGD) WITH PROPOFOL BIOPSY BALLOON DILATION POLYPECTOMY  Patient location during evaluation: Short Stay Anesthesia Type: General Level of consciousness: awake and alert Pain management: pain level controlled Vital Signs Assessment: post-procedure vital signs reviewed and stable Respiratory status: nonlabored ventilation Cardiovascular status: blood pressure returned to baseline and stable Postop Assessment: no apparent nausea or vomiting Anesthetic complications: no   No notable events documented.   Last Vitals:  Vitals:   09/21/21 0921 09/21/21 1150  BP: (!) 100/50 (!) 96/56  Pulse: 68 66  Resp: 18 16  Temp: 37 C 36.4 C  SpO2: 98% 98%    Last Pain:  Vitals:   09/21/21 1150  TempSrc: Oral  PainSc: 0-No pain                 Jorgina Binning

## 2021-09-21 NOTE — Interval H&P Note (Signed)
History and Physical Interval Note:  09/21/2021 9:21 AM  Ryan [name]  has presented today for surgery, with the diagnosis of Screening Colonoscopy Dysphagia.  The various methods of treatment have been discussed with the patient and family. After consideration of risks, benefits and other options for treatment, the patient has consented to  Procedure(s) with comments: COLONOSCOPY WITH PROPOFOL (N/A) - 11:10 ESOPHAGOGASTRODUODENOSCOPY (EGD) WITH PROPOFOL (N/A) as a surgical intervention.  The patient's history has been reviewed, patient examined, no change in status, stable for surgery.  I have reviewed the patient's chart and labs.  Questions were answered to the patient's satisfaction.     Ryan [name]

## 2021-09-21 NOTE — Op Note (Signed)
[facility] Patient Name: Ryan [name] Procedure Date: 09/21/2021 11:13 AM MRN: [id number] Date of Birth: [birth year] Attending MD: [name] ,  CSN: [id number] Age: 50 Admit Type: Outpatient Procedure:                Colonoscopy Indications:              Screening for colorectal malignant neoplasm Providers:                [name], Caprice Kluver, Janeece Riggers, RN,                            Vista Mink, Technician Referring MD:              Medicines:                Monitored Anesthesia Care Complications:            No immediate complications. Estimated Blood Loss:     Estimated blood loss: none. Procedure:                Pre-Anesthesia Assessment:                           - Prior to the procedure, a History and Physical                            was performed, and patient medications, allergies                            and sensitivities were reviewed. The patient's                            tolerance of previous anesthesia was reviewed.                           - The risks and benefits of the procedure and the                            sedation options and risks were discussed with the                            patient. All questions were answered and informed                            consent was obtained.                           - ASA Grade Assessment: II - A patient with mild                            systemic disease.                           After obtaining informed consent, the colonoscope                            was passed under direct vision. Throughout the  procedure, the patient's blood pressure, pulse, and                            oxygen saturations were monitored continuously. The                            [id number]-HQ190L ([id number]) scope was introduced through                            the anus and advanced to the the cecum, identified                            by appendiceal orifice and ileocecal  valve. The                            colonoscopy was performed without difficulty. The                            patient tolerated the procedure well. The quality                            of the bowel preparation was adequate to identify                            polyps 6 mm and larger in size. Scope In: 11:17:11 AM Scope Out: 11:45:30 AM Scope Withdrawal Time: 0 hours 15 minutes 28 seconds  Total Procedure Duration: 0 hours 28 minutes 19 seconds  Findings:      The perianal and digital rectal examinations were normal.      A 6 mm polyp was found in the ascending colon. The polyp was sessile.       The polyp was removed with a cold snare. Resection and retrieval were       complete.      Multiple small and large-mouthed diverticula were found in the sigmoid       colon, descending colon and ascending colon.      Non-bleeding internal hemorrhoids were found during retroflexion. The       hemorrhoids were small. Impression:               - One 6 mm polyp in the ascending colon, removed                            with a cold snare. Resected and retrieved.                           - Diverticulosis in the sigmoid colon, in the                            descending colon and in the ascending colon.                           - Non-bleeding internal hemorrhoids. Moderate Sedation:      Per Anesthesia Care Recommendation:           - Discharge patient to home (ambulatory).                           -  Resume previous diet.                           - Await pathology results.                           - Repeat colonoscopy in 5 years for surveillance. Procedure Code(s):        --- Professional ---                           [phone], Colonoscopy, flexible; with removal of                            tumor(s), polyp(s), or other lesion(s) by snare                            technique Diagnosis Code(s):        --- Professional ---                           Z12.11, Encounter for screening for  malignant                            neoplasm of colon                           K63.5, Polyp of colon                           K64.8, Other hemorrhoids                           K57.30, Diverticulosis of large intestine without                            perforation or abscess without bleeding CPT copyright 2019 American Medical Association. All rights reserved. The codes documented in this report are preliminary and upon coder review may  be revised to meet current compliance requirements. [name], MD [name],  09/21/2021 11:56:59 AM This report has been signed electronically. Number of Addenda: 0

## 2021-09-21 NOTE — Op Note (Signed)
[facility] Patient Name: Ryan [name] Procedure Date: 09/21/2021 10:47 AM MRN: [id number] Date of Birth: 1971-08-09 Attending MD: [name] ,  CSN: [id number] Age: 50 Admit Type: Outpatient Procedure:                Upper GI endoscopy Indications:              Dysphagia Providers:                [name], Caprice Kluver, Janeece Riggers, RN,                            Suzan Garibaldi Risa Grill, Technician Referring MD:              Medicines:                Monitored Anesthesia Care Complications:            No immediate complications. Estimated Blood Loss:     Estimated blood loss: none. Procedure:                Pre-Anesthesia Assessment:                           - Prior to the procedure, a History and Physical                            was performed, and patient medications, allergies                            and sensitivities were reviewed. The patient's                            tolerance of previous anesthesia was reviewed.                           - The risks and benefits of the procedure and the                            sedation options and risks were discussed with the                            patient. All questions were answered and informed                            consent was obtained.                           - ASA Grade Assessment: II - A patient with mild                            systemic disease.                           After obtaining informed consent, the endoscope was                            passed under direct vision. Throughout the  procedure, the patient's blood pressure, pulse, and                            oxygen saturations were monitored continuously. The                            [id number]-H190 ([id number]) scope was introduced through the                            mouth, and advanced to the second part of duodenum.                            The upper GI endoscopy was accomplished without                             difficulty. The patient tolerated the procedure                            well. Scope In: 10:59:08 AM Scope Out: 11:11:15 AM Total Procedure Duration: 0 hours 12 minutes 7 seconds  Findings:      LA Grade B (one or more mucosal breaks greater than 5 mm, not extending       between the tops of two mucosal folds) esophagitis with no bleeding was       found at the gastroesophageal junction.      A non-obstructing Schatzki ring was found at the gastroesophageal       junction. This was initially disrupted with a cold forceps. A TTS       dilator was passed through the scope. Dilation with a 12-13.5-15 mm       balloon dilator was performed to 15 mm.      A 2 cm hiatal hernia was present.      A few localized small erosions with no stigmata of recent bleeding were       found in the stomach. Biopsies were taken with a cold forceps for       Helicobacter pylori testing.      Patchy moderate inflammation characterized by congestion (edema),       erosions and erythema was found in the duodenal bulb and in the first       portion of the duodenum. Impression:               - LA Grade B reflux esophagitis with no bleeding.                           - Non-obstructing Schatzki ring. Dilated.                           - 2 cm hiatal hernia.                           - Erosive gastropathy with no stigmata of recent                            bleeding. Biopsied.                           -  Duodenitis. Moderate Sedation:      Per Anesthesia Care Recommendation:           - Discharge patient to home (ambulatory).                           - Resume previous diet.                           - Await pathology results.                           - Use Prilosec (omeprazole) 40 mg PO BID for 3                            months, then decrease to once a day dosing.                           - No ibuprofen, naproxen, or other non-steroidal                            anti-inflammatory drugs.                            - Follow up in GI clinic in 6 months, if recurrent                            dysphagia may repeat EGD. Procedure Code(s):        --- Professional ---                           [phone], Esophagogastroduodenoscopy, flexible,                            transoral; with transendoscopic balloon dilation of                            esophagus (less than 30 mm diameter)                           43239, 59, Esophagogastroduodenoscopy, flexible,                            transoral; with biopsy, single or multiple Diagnosis Code(s):        --- Professional ---                           K21.00, Gastro-esophageal reflux disease with                            esophagitis, without bleeding                           K22.2, Esophageal obstruction                           K44.9, Diaphragmatic hernia without obstruction or  gangrene                           K31.89, Other diseases of stomach and duodenum                           K29.80, Duodenitis without bleeding                           R13.10, Dysphagia, unspecified CPT copyright 2019 American Medical Association. All rights reserved. The codes documented in this report are preliminary and upon coder review may  be revised to meet current compliance requirements. [name], MD [name],  09/21/2021 11:53:53 AM This report has been signed electronically. Number of Addenda: 0

## 2021-09-21 NOTE — Progress Notes (Signed)
Pamala Hurry RN gave credit card to wife of pt

## 2021-09-21 NOTE — Anesthesia Preprocedure Evaluation (Signed)
Anesthesia Evaluation  Patient identified by MRN, date of birth, ID band Patient awake    Reviewed: Allergy & Precautions, NPO status , Patient's Chart, lab work & pertinent test results  Airway        Dental  (+) Dental Advisory Given   Pulmonary pneumonia, Current Smoker and Patient abstained from smoking.,    Pulmonary exam normal breath sounds clear to auscultation       Cardiovascular hypertension, Pt. on medications Normal cardiovascular exam Rhythm:Regular Rate:Normal     Neuro/Psych PSYCHIATRIC DISORDERS Anxiety Depression Bipolar Disorder negative neurological ROS     GI/Hepatic GERD  Medicated,(+)     substance abuse  cocaine use and marijuana use,   Endo/Other  negative endocrine ROS  Renal/GU Renal InsufficiencyRenal disease  negative genitourinary   Musculoskeletal negative musculoskeletal ROS (+)   Abdominal   Peds negative pediatric ROS (+)  Hematology  (+) Blood dyscrasia, anemia , HIV,   Anesthesia Other Findings   Reproductive/Obstetrics negative OB ROS                            Anesthesia Physical Anesthesia Plan  ASA: 3  Anesthesia Plan: General   Post-op Pain Management: Minimal or no pain anticipated   Induction:   PONV Risk Score and Plan: TIVA  Airway Management Planned: Nasal Cannula and Natural Airway  Additional Equipment:   Intra-op Plan:   Post-operative Plan:   Informed Consent: I have reviewed the patients History and Physical, chart, labs and discussed the procedure including the risks, benefits and alternatives for the proposed anesthesia with the patient or authorized representative who has indicated his/her understanding and acceptance.     Dental advisory given  Plan Discussed with: CRNA and Surgeon  Anesthesia Plan Comments:         Anesthesia Quick Evaluation

## 2021-09-21 NOTE — Discharge Instructions (Signed)
You are being discharged to home.  Resume your previous diet.  We are waiting for your pathology results.  Take Prilosec (omeprazole) 40 mg by mouth twice a day for three months, then decrease to once a day dosing.  Do not take any ibuprofen (including Advil, Motrin or Nuprin), naproxen, or other non-steroidal anti-inflammatory drugs.  Follow up in GI clinic in 6 months, if recurrent dysphagia may repeat EGD. Your physician has recommended a repeat colonoscopy in five years for surveillance.

## 2021-09-24 LAB — SURGICAL PATHOLOGY

## 2021-09-25 ENCOUNTER — Other Ambulatory Visit: Payer: Self-pay | Admitting: Infectious Diseases

## 2021-09-25 ENCOUNTER — Encounter (HOSPITAL_COMMUNITY): Payer: Self-pay | Admitting: Gastroenterology

## 2021-09-25 DIAGNOSIS — B20 Human immunodeficiency virus [HIV] disease: Secondary | ICD-10-CM

## 2021-09-26 ENCOUNTER — Encounter (INDEPENDENT_AMBULATORY_CARE_PROVIDER_SITE_OTHER): Payer: Self-pay | Admitting: *Deleted

## 2022-01-14 ENCOUNTER — Ambulatory Visit (INDEPENDENT_AMBULATORY_CARE_PROVIDER_SITE_OTHER): Payer: BC Managed Care – PPO | Admitting: Gastroenterology

## 2022-01-14 ENCOUNTER — Encounter (INDEPENDENT_AMBULATORY_CARE_PROVIDER_SITE_OTHER): Payer: Self-pay | Admitting: Gastroenterology

## 2022-03-05 ENCOUNTER — Other Ambulatory Visit: Payer: Self-pay | Admitting: Internal Medicine

## 2022-05-07 ENCOUNTER — Other Ambulatory Visit: Payer: Self-pay | Admitting: Internal Medicine

## 2022-05-07 DIAGNOSIS — B20 Human immunodeficiency virus [HIV] disease: Secondary | ICD-10-CM

## 2022-05-28 ENCOUNTER — Other Ambulatory Visit: Payer: Self-pay | Admitting: Internal Medicine

## 2022-05-28 DIAGNOSIS — B20 Human immunodeficiency virus [HIV] disease: Secondary | ICD-10-CM

## 2022-05-28 NOTE — Telephone Encounter (Signed)
Patient needs appointment, sent MyChart message.   Beryle Flock, RN

## 2022-05-28 NOTE — Telephone Encounter (Signed)
Called patient to schedule appointment, call could not be completed.   Beryle Flock, RN

## 2022-08-03 ENCOUNTER — Other Ambulatory Visit: Payer: Self-pay | Admitting: Internal Medicine

## 2022-08-03 DIAGNOSIS — B20 Human immunodeficiency virus [HIV] disease: Secondary | ICD-10-CM

## 2022-08-05 NOTE — Telephone Encounter (Signed)
Unable to contact patient voicemail not setup

## 2022-08-06 NOTE — Telephone Encounter (Signed)
Per chart review, patient has sporadic refill history of Biktarvy and has not had labs done since 05/2021. Multiple attempts have been made to reach the patient without success. He will need to call for an appointment.   Beryle Flock, RN'

## 2022-08-07 ENCOUNTER — Other Ambulatory Visit: Payer: Self-pay | Admitting: Internal Medicine

## 2022-08-07 DIAGNOSIS — B20 Human immunodeficiency virus [HIV] disease: Secondary | ICD-10-CM

## 2022-08-17 ENCOUNTER — Encounter (INDEPENDENT_AMBULATORY_CARE_PROVIDER_SITE_OTHER): Payer: Self-pay | Admitting: Gastroenterology

## 2022-08-26 ENCOUNTER — Other Ambulatory Visit (HOSPITAL_COMMUNITY): Payer: Self-pay

## 2022-08-26 ENCOUNTER — Telehealth: Payer: Self-pay | Admitting: Internal Medicine

## 2022-08-26 ENCOUNTER — Telehealth: Payer: Self-pay

## 2022-08-26 NOTE — Telephone Encounter (Signed)
Tried to call patient to apply for finanical assistance, says voice mail if full

## 2022-08-26 NOTE — Telephone Encounter (Signed)
Patient called office today stating he has been out of medication for 3-4 weeks. Recently lost his job and is uninsured. Scheduled him appointment with pharmacy team tomorrow for labs and follow up. Will have financial counselor call him today to start process for [place], RMA

## 2022-08-27 ENCOUNTER — Encounter: Payer: Self-pay | Admitting: Internal Medicine

## 2022-08-27 ENCOUNTER — Other Ambulatory Visit: Payer: Self-pay

## 2022-08-27 ENCOUNTER — Ambulatory Visit: Payer: Self-pay

## 2022-08-27 ENCOUNTER — Other Ambulatory Visit (HOSPITAL_COMMUNITY): Payer: Self-pay

## 2022-08-27 DIAGNOSIS — B20 Human immunodeficiency virus [HIV] disease: Secondary | ICD-10-CM

## 2022-08-27 DIAGNOSIS — Z113 Encounter for screening for infections with a predominantly sexual mode of transmission: Secondary | ICD-10-CM

## 2022-08-27 DIAGNOSIS — Z79899 Other long term (current) drug therapy: Secondary | ICD-10-CM

## 2022-08-28 LAB — URINE CYTOLOGY ANCILLARY ONLY
Chlamydia: NEGATIVE
Comment: NEGATIVE
Comment: NORMAL
Neisseria Gonorrhea: NEGATIVE

## 2022-08-29 LAB — CBC WITH DIFFERENTIAL/PLATELET
Absolute Monocytes: 357 cells/uL (ref 200–950)
Basophils Absolute: 40 cells/uL (ref 0–200)
Basophils Relative: 1.3 %
Eosinophils Absolute: 59 cells/uL (ref 15–500)
Eosinophils Relative: 1.9 %
HCT: 41.1 % (ref 38.5–50.0)
Hemoglobin: 14 g/dL (ref 13.2–17.1)
Lymphs Abs: 905 cells/uL (ref 850–3900)
MCH: 32.6 pg (ref 27.0–33.0)
MCHC: 34.1 g/dL (ref 32.0–36.0)
MCV: 95.6 fL (ref 80.0–100.0)
MPV: 11.2 fL (ref 7.5–12.5)
Monocytes Relative: 11.5 %
Neutro Abs: 1739 cells/uL (ref 1500–7800)
Neutrophils Relative %: 56.1 %
Platelets: 174 10*3/uL (ref 140–400)
RBC: 4.3 10*6/uL (ref 4.20–5.80)
RDW: 12.8 % (ref 11.0–15.0)
Total Lymphocyte: 29.2 %
WBC: 3.1 10*3/uL — ABNORMAL LOW (ref 3.8–10.8)

## 2022-08-29 LAB — COMPLETE METABOLIC PANEL WITH GFR
AG Ratio: 1.4 (calc) (ref 1.0–2.5)
ALT: 15 U/L (ref 9–46)
AST: 15 U/L (ref 10–35)
Albumin: 4 g/dL (ref 3.6–5.1)
Alkaline phosphatase (APISO): 52 U/L (ref 35–144)
BUN: 13 mg/dL (ref 7–25)
CO2: 28 mmol/L (ref 20–32)
Calcium: 9.3 mg/dL (ref 8.6–10.3)
Chloride: 102 mmol/L (ref 98–110)
Creat: 0.99 mg/dL (ref 0.70–1.30)
Globulin: 2.8 g/dL (calc) (ref 1.9–3.7)
Glucose, Bld: 96 mg/dL (ref 65–99)
Potassium: 3.7 mmol/L (ref 3.5–5.3)
Sodium: 139 mmol/L (ref 135–146)
Total Bilirubin: 0.3 mg/dL (ref 0.2–1.2)
Total Protein: 6.8 g/dL (ref 6.1–8.1)
eGFR: 92 mL/min/{1.73_m2} (ref >=60)

## 2022-08-29 LAB — LIPID PANEL
Cholesterol: 149 mg/dL (ref <200)
HDL: 45 mg/dL (ref >=40)
LDL Cholesterol (Calc): 68 mg/dL (calc)
Non-HDL Cholesterol (Calc): 104 mg/dL (calc) (ref <130)
Total CHOL/HDL Ratio: 3.3 (calc) (ref <5.0)
Triglycerides: 271 mg/dL — ABNORMAL HIGH (ref <150)

## 2022-08-29 LAB — RPR: RPR Ser Ql: NONREACTIVE

## 2022-08-29 LAB — HIV-1 RNA QUANT-NO REFLEX-BLD
HIV 1 RNA Quant: 46200 Copies/mL — ABNORMAL HIGH
HIV-1 RNA Quant, Log: 4.66 Log cps/mL — ABNORMAL HIGH

## 2022-08-30 LAB — T-HELPER CELL (CD4) - (RCID CLINIC ONLY)
CD4 % Helper T Cell: 18 % — ABNORMAL LOW (ref 33–65)
CD4 T Cell Abs: 153 /uL — ABNORMAL LOW (ref 400–1790)

## 2022-09-02 ENCOUNTER — Other Ambulatory Visit (HOSPITAL_COMMUNITY): Payer: Self-pay

## 2022-09-02 NOTE — Progress Notes (Signed)
09/02/2022  HPI: Ryan [name] is a 51 y.o. male who presents to the [facility] clinic for HIV follow-up.  Patient Active Problem List   Diagnosis Date Noted   Dysphagia 09/13/2021   Bloating 09/13/2021   Homelessness 06/27/2013   Weakness generalized 06/27/2013   Abnormal TSH 06/27/2013   Anemia 06/27/2013   Fever and chills 06/25/2013   PNA (pneumonia) 06/25/2013   Anxiety 06/10/2013   H/O: substance abuse ([place]) 06/09/2013   Malnutrition of moderate degree ([place]) 05/21/2013   Human immunodeficiency virus (HIV) disease ([place]) 05/21/2013   Orthostatic hypotension 05/20/2013   Chest pain on exertion 05/20/2013   Weight loss 05/20/2013   Bipolar disorder ([place]) 05/20/2013   GERD (gastroesophageal reflux disease) 05/20/2013   Hypotension 01/03/2012   ARF (acute renal failure) ([place]) 01/03/2012   Rash 01/03/2012   HTN (hypertension) 01/03/2012    Patient's Medications  New Prescriptions   No medications on file  Previous Medications   ACETAMINOPHEN (TYLENOL) 500 MG TABLET    Take 1,000 mg by mouth every 8 (eight) hours as needed for moderate pain.   AMLODIPINE-OLMESARTAN (AZOR) 5-20 MG TABLET    Take 1 tablet by mouth daily.   BICTEGRAVIR-EMTRICITABINE-TENOFOVIR AF (BIKTARVY) 50-200-25 MG TABS TABLET    Take 1 tablet by mouth daily. No further refills until seen in office.   FENOFIBRATE (TRICOR) 48 MG TABLET    Take 1 tablet (48 mg total) by mouth daily.   LITHIUM 300 MG TABLET    Take 300 mg by mouth daily.   MULTIPLE VITAMINS-MINERALS (MULTIVITAMIN WITH MINERALS) TABLET    Take 1 tablet by mouth daily.   OMEPRAZOLE (PRILOSEC) 40 MG CAPSULE    Take 1 capsule (40 mg total) by mouth daily. Take twice a day for 3 months, then decrease to once a day   OXYMETAZOLINE (AFRIN) 0.05 % NASAL SPRAY    Place 1 spray into both nostrils 3 (three) times daily as needed for congestion.   QUETIAPINE (SEROQUEL) 100 MG TABLET    Take 200 mg by mouth at bedtime.  Modified Medications   No  medications on file  Discontinued Medications   No medications on file    Allergies: Allergies  Allergen Reactions   Levaquin [Levofloxacin In D5w] Other (See Comments)    Pain & nerve damage in fingers   Other     Red meat - Alpha Gal   Sulfa Antibiotics Rash    Past Medical History: Past Medical History:  Diagnosis Date   Bipolar disorder ([name])    Depressed    GERD (gastroesophageal reflux disease)    H/O: substance abuse ([place]) 06/09/2013   Crack cocaine,  marijuana   HIV (human immunodeficiency virus infection) ([place])    Hypertension     Social History: Social History   Socioeconomic History   Marital status: Married    Spouse name: Not on file   Number of children: Not on file   Years of education: Not on file   Highest education level: Not on file  Occupational History   Not on file  Tobacco Use   Smoking status: Every Day    Packs/day: 0.25    Types: Cigarettes   Smokeless tobacco: Never  Vaping Use   Vaping Use: Never used  Substance and Sexual Activity   Alcohol use: Yes    Comment: occasionally   Drug use: No    Types: Cocaine, Marijuana, Other-see comments    Comment: presription pills - history but has not  done any in 1 year   Sexual activity: Not Currently    Comment: declined condoms  Other Topics Concern   Not on file  Social History Narrative   Not on file   Social Determinants of Health   Financial Resource Strain: Not on file  Food Insecurity: Not on file  Transportation Needs: Not on file  Physical Activity: Not on file  Stress: Not on file  Social Connections: Not on file    Labs: Lab Results  Component Value Date   HIV1RNAQUANT 46,200 (H) 08/27/2022   HIV1RNAQUANT Not Detected 05/15/2021   HIV1RNAQUANT <20 11/27/2020   CD4TABS 153 (L) 08/27/2022   CD4TABS 329 (L) 05/15/2021   CD4TABS 282 (L) 11/27/2020    RPR and STI Lab Results  Component Value Date   LABRPR NON-REACTIVE 08/27/2022   LABRPR NON-REACTIVE 05/15/2021    LABRPR NON-REACTIVE 11/27/2020   LABRPR NON-REACTIVE 12/23/2019   LABRPR NON-REACTIVE 12/01/2018    STI Results GC CT  08/27/2022  9:41 AM Negative  Negative   05/14/2017 12:00 AM Negative  Negative     Hepatitis B Lab Results  Component Value Date   HEPBSAB NEG 06/03/2013   HEPBSAG NEGATIVE 06/03/2013   HEPBCAB NEG 06/03/2013   Hepatitis C No results found for: "HEPCAB", "HCVRNAPCRQN" Hepatitis A Lab Results  Component Value Date   HAV POS (A) 06/03/2013   Lipids: Lab Results  Component Value Date   CHOL 149 08/27/2022   TRIG 271 (H) 08/27/2022   HDL 45 08/27/2022   CHOLHDL 3.3 08/27/2022   VLDL 23 05/14/2017   LDLCALC 68 08/27/2022    Current HIV Regimen: Biktarvy PO daily but last filled 05/08/22 for 30 day supply  Assessment: Ryan [name] presents for an HIV follow-up visit. He reports having lost his job recently and has been out of his [place] for 4-5 weeks. He was given a week's worth of Biktarvy samples when he was here 11/21 for labs and he reports not missing any doses. HIV viral load drawn 08/27/22 and was 46,200 after pt had been undetectable and CD4 count is 153. This information was relayed to the patient and we informed him of his AIDS classification. We discussed this isn't permanent and will improve once he is back on his Biktarvy. We also discussed initiation of Bactrim for PJP prophylaxis. Opportunistic infections, administration, adverse effects and how to combat them, and that Bactrim isn't a permanent medication was discussed with him. His [place] has been approved for vaccines but not yet for medications. We provided Biktarvy samples today for a month until the [employer] can cover the medication. We had no samples of Bactrim, but will send in a prescription once the coverage is active. He informed us that his partner is not on PrEP and we offered for her to come in and test her for HIV infection.  He was offered STI screeninf and condoms but  declined them at this time.  He is eligible for the flu, COVID and meningococcal vaccines. After discussion of the risks and benefits, he agreed to all 3 vaccines. Administered the COVID and Flu vaccines in the right deltoid and the meningococcal vaccine in the left deltoid.  Plan: - Follow up on 10/08/2022 at 8:45 AM with Cassie  - Collect Hep B surface Ab, HIV viral load at next visit - Offer the 2nd Meningococcal in 1-2 months  Tanja Port, [facility] Infectious Disease

## 2022-09-03 ENCOUNTER — Other Ambulatory Visit: Payer: Self-pay

## 2022-09-03 ENCOUNTER — Other Ambulatory Visit: Payer: Self-pay | Admitting: Pharmacist

## 2022-09-03 ENCOUNTER — Ambulatory Visit (INDEPENDENT_AMBULATORY_CARE_PROVIDER_SITE_OTHER): Payer: BC Managed Care – PPO | Admitting: Pharmacist

## 2022-09-03 ENCOUNTER — Ambulatory Visit (INDEPENDENT_AMBULATORY_CARE_PROVIDER_SITE_OTHER): Payer: Self-pay

## 2022-09-03 DIAGNOSIS — Z23 Encounter for immunization: Secondary | ICD-10-CM

## 2022-09-03 DIAGNOSIS — B20 Human immunodeficiency virus [HIV] disease: Secondary | ICD-10-CM

## 2022-09-03 MED ORDER — BIKTARVY 50-200-25 MG PO TABS: 1.0000 | ORAL_TABLET | Freq: Every day | ORAL | 0 refills | Status: AC

## 2022-09-03 NOTE — Progress Notes (Signed)
We saw him this morning already. He signed up for RW/HMAP on 08/27/22.

## 2022-09-03 NOTE — Progress Notes (Signed)
Medication Samples have been provided to the patient.  Drug name: Biktarvy        Strength: 50/200/25 mg       Qty: 7 tablets (1 bottle)   LOT: [place]   Exp.Date: 06/2024  Dosing instructions: Take one tablet by mouth once daily  The patient has been instructed regarding the correct time, dose, and frequency of taking this medication, including desired effects and most common side effects.   Correna Meacham L. Eber Hong, PharmD, BCIDP, AAHIVP, CPP Clinical Pharmacist Practitioner Infectious Diseases [facility] 09/18/2020, 10:07 AM

## 2022-09-04 ENCOUNTER — Other Ambulatory Visit: Payer: Self-pay | Admitting: Pharmacist

## 2022-09-04 DIAGNOSIS — B20 Human immunodeficiency virus [HIV] disease: Secondary | ICD-10-CM

## 2022-09-04 MED ORDER — BIKTARVY 50-200-25 MG PO TABS: 1.0000 | ORAL_TABLET | Freq: Every day | ORAL | 2 refills | Status: DC

## 2022-10-08 ENCOUNTER — Other Ambulatory Visit: Payer: Self-pay | Admitting: Pharmacist

## 2022-10-08 ENCOUNTER — Other Ambulatory Visit: Payer: Self-pay

## 2022-10-08 ENCOUNTER — Other Ambulatory Visit (HOSPITAL_COMMUNITY): Payer: Self-pay

## 2022-10-08 ENCOUNTER — Ambulatory Visit: Payer: Self-pay | Admitting: Pharmacist

## 2022-10-08 DIAGNOSIS — B20 Human immunodeficiency virus [HIV] disease: Secondary | ICD-10-CM

## 2022-10-08 MED ORDER — BIKTARVY 50-200-25 MG PO TABS: 1.0000 | ORAL_TABLET | Freq: Every day | ORAL | 2 refills | Status: DC

## 2022-10-09 ENCOUNTER — Ambulatory Visit (INDEPENDENT_AMBULATORY_CARE_PROVIDER_SITE_OTHER): Payer: Self-pay | Admitting: Pharmacist

## 2022-10-09 ENCOUNTER — Other Ambulatory Visit: Payer: Self-pay

## 2022-10-09 DIAGNOSIS — B20 Human immunodeficiency virus [HIV] disease: Secondary | ICD-10-CM

## 2022-10-09 MED ORDER — BIKTARVY 50-200-25 MG PO TABS: 1.0000 | ORAL_TABLET | Freq: Every day | ORAL | 2 refills | Status: DC

## 2022-10-09 NOTE — Progress Notes (Signed)
10/09/2022  HPI: Ryan [name] is a 52 y.o. male who presents to the [facility] for HIV follow-up.  Patient Active Problem List   Diagnosis Date Noted   Dysphagia 09/13/2021   Bloating 09/13/2021   Homelessness 06/27/2013   Weakness generalized 06/27/2013   Abnormal TSH 06/27/2013   Anemia 06/27/2013   Fever and chills 06/25/2013   PNA (pneumonia) 06/25/2013   Anxiety 06/10/2013   H/O: substance abuse ([place]) 06/09/2013   Malnutrition of moderate degree ([place]) 05/21/2013   Human immunodeficiency virus (HIV) disease ([place]) 05/21/2013   Orthostatic hypotension 05/20/2013   Chest pain on exertion 05/20/2013   Weight loss 05/20/2013   Bipolar disorder ([place]) 05/20/2013   GERD (gastroesophageal reflux disease) 05/20/2013   Hypotension 01/03/2012   ARF (acute renal failure) ([place]) 01/03/2012   Rash 01/03/2012   HTN (hypertension) 01/03/2012    Patient's Medications  New Prescriptions   No medications on file  Previous Medications   ACETAMINOPHEN (TYLENOL) 500 MG TABLET    Take 1,000 mg by mouth every 8 (eight) hours as needed for moderate pain.   AMLODIPINE-OLMESARTAN (AZOR) 5-20 MG TABLET    Take 1 tablet by mouth daily.   FENOFIBRATE (TRICOR) 48 MG TABLET    Take 1 tablet (48 mg total) by mouth daily.   LITHIUM 300 MG TABLET    Take 300 mg by mouth daily.   MULTIPLE VITAMINS-MINERALS (MULTIVITAMIN WITH MINERALS) TABLET    Take 1 tablet by mouth daily.   OMEPRAZOLE (PRILOSEC) 40 MG CAPSULE    Take 1 capsule (40 mg total) by mouth daily. Take twice a day for 3 months, then decrease to once a day   OXYMETAZOLINE (AFRIN) 0.05 % NASAL SPRAY    Place 1 spray into both nostrils 3 (three) times daily as needed for congestion.   QUETIAPINE (SEROQUEL) 100 MG TABLET    Take 200 mg by mouth at bedtime.  Modified Medications   Modified Medication Previous Medication   BICTEGRAVIR-EMTRICITABINE-TENOFOVIR AF (BIKTARVY) 50-200-25 MG TABS TABLET bictegravir-emtricitabine-tenofovir AF  (BIKTARVY) 50-200-25 MG TABS tablet      Take 1 tablet by mouth daily.    Take 1 tablet by mouth daily.  Discontinued Medications   No medications on file    Allergies: Allergies  Allergen Reactions   Levaquin [Levofloxacin In D5w] Other (See Comments)    Pain & nerve damage in fingers   Other     Red meat - Alpha Gal   Sulfa Antibiotics Rash    Past Medical History: Past Medical History:  Diagnosis Date   Bipolar disorder ([place])    Depressed    GERD (gastroesophageal reflux disease)    H/O: substance abuse ([place]) 06/09/2013   Crack cocaine,  marijuana   HIV (human immunodeficiency virus infection) ([place])    Hypertension     Social History: Social History   Socioeconomic History   Marital status: Married    Spouse name: Not on file   Number of children: Not on file   Years of education: Not on file   Highest education level: Not on file  Occupational History   Not on file  Tobacco Use   Smoking status: Every Day    Packs/day: 0.25    Types: Cigarettes   Smokeless tobacco: Never  Vaping Use   Vaping Use: Never used  Substance and Sexual Activity   Alcohol use: Yes    Comment: occasionally   Drug use: No    Types: Cocaine, Marijuana, Other-see comments  Comment: presription pills - history but has not done any in 1 year   Sexual activity: Not Currently    Comment: declined condoms  Other Topics Concern   Not on file  Social History Narrative   Not on file   Social Determinants of Health   Financial Resource Strain: Not on file  Food Insecurity: Not on file  Transportation Needs: Not on file  Physical Activity: Not on file  Stress: Not on file  Social Connections: Not on file    Labs: Lab Results  Component Value Date   HIV1RNAQUANT 46,200 (H) 08/27/2022   HIV1RNAQUANT Not Detected 05/15/2021   HIV1RNAQUANT <20 11/27/2020   CD4TABS 153 (L) 08/27/2022   CD4TABS 329 (L) 05/15/2021   CD4TABS 282 (L) 11/27/2020    RPR and STI Lab Results   Component Value Date   LABRPR NON-REACTIVE 08/27/2022   LABRPR NON-REACTIVE 05/15/2021   LABRPR NON-REACTIVE 11/27/2020   LABRPR NON-REACTIVE 12/23/2019   LABRPR NON-REACTIVE 12/01/2018    STI Results GC CT  08/27/2022  9:41 AM Negative  Negative   05/14/2017 12:00 AM Negative  Negative     Hepatitis B Lab Results  Component Value Date   HEPBSAB NEG 06/03/2013   HEPBSAG NEGATIVE 06/03/2013   HEPBCAB NEG 06/03/2013   Hepatitis C No results found for: "HEPCAB", "HCVRNAPCRQN" Hepatitis A Lab Results  Component Value Date   HAV POS (A) 06/03/2013   Lipids: Lab Results  Component Value Date   CHOL 149 08/27/2022   TRIG 271 (H) 08/27/2022   HDL 45 08/27/2022   CHOLHDL 3.3 08/27/2022   VLDL 23 05/14/2017   LDLCALC 68 08/27/2022    Current HIV Regimen: Biktarvy  Assessment: Ryan [name] presents today to follow up for his HIV infection after restarting Biktarvy back in November. He was previously off of medication x 2 months due to losing his job and insurance. He was provided refills at that time and picked up his [facility] from [facility] yesterday. His Ryan [name] is approved through July. He is feeling better since restarting and has missed no doses.   His CD4 count was 153 when it was checked back in November. He has a sulfa allergy so he cannot take Bactrim. I do not see that a G6PD lab was ever checked in order to prescribe dapsone so will order that today. I advised that we would recheck his CD4 count today and go from there. He is ok with starting dapsone if needed. No other problems today. He had a few questions regarding injecting testosterone that he receives from another provider. Will check labs today and have him see Dr. Baxter Flattery in ~3 months.   Plan: - HIV viral load, CD4 count, and G6PD today - Continue Biktarvy - refills sent - F/u with Dr. Baxter Flattery 01/13/23  Katey Barrie L. Abdimalik Mayorquin, PharmD, BCIDP, AAHIVP, CPP Clinical Pharmacist Practitioner Infectious  Diseases [facility] 10/09/2022, 12:07 PM

## 2022-10-10 LAB — T-HELPER CELLS (CD4) COUNT (NOT AT ARMC)
CD4 % Helper T Cell: 27 % — ABNORMAL LOW (ref 33–65)
CD4 T Cell Abs: 291 /uL — ABNORMAL LOW (ref 400–1790)

## 2022-10-13 LAB — HIV-1 RNA QUANT-NO REFLEX-BLD
HIV 1 RNA Quant: <20 Copies/mL — ABNORMAL HIGH
HIV-1 RNA Quant, Log: <1.3 Log cps/mL — ABNORMAL HIGH

## 2022-10-13 LAB — GLUCOSE 6 PHOSPHATE DEHYDROGENASE: G-6PDH: 14.8 U/g Hgb (ref 7.0–20.5)

## 2023-01-13 ENCOUNTER — Other Ambulatory Visit: Payer: Self-pay

## 2023-01-13 ENCOUNTER — Encounter: Payer: Self-pay | Admitting: Internal Medicine

## 2023-01-13 ENCOUNTER — Ambulatory Visit (INDEPENDENT_AMBULATORY_CARE_PROVIDER_SITE_OTHER): Payer: Self-pay | Admitting: Internal Medicine

## 2023-01-13 VITALS — BP 119/81 | HR 70 | Temp 97.8°F | Wt 190.0 lb

## 2023-01-13 DIAGNOSIS — B20 Human immunodeficiency virus [HIV] disease: Secondary | ICD-10-CM

## 2023-01-13 DIAGNOSIS — G629 Polyneuropathy, unspecified: Secondary | ICD-10-CM

## 2023-01-13 DIAGNOSIS — Z789 Other specified health status: Secondary | ICD-10-CM

## 2023-01-13 DIAGNOSIS — Z79899 Other long term (current) drug therapy: Secondary | ICD-10-CM

## 2023-01-13 LAB — LIPID PANEL: LDL Cholesterol (Calc): 84 mg/dL (calc)

## 2023-01-13 MED ORDER — GABAPENTIN 300 MG PO CAPS: 300.0000 mg | ORAL_CAPSULE | Freq: Every day | ORAL | 0 refills | Status: DC

## 2023-01-13 MED ORDER — ROSUVASTATIN CALCIUM 10 MG PO TABS: 10.0000 mg | ORAL_TABLET | Freq: Every day | ORAL | 11 refills | Status: AC

## 2023-01-13 NOTE — Progress Notes (Signed)
RFV: follow up for hiv disease  Patient ID: Ryan [name], male   DOB: [birth year], 52 y.o.   MRN: [id number]  HPI Ryan [name] is a 52yo M with HIV disease. CD 4 count 291/VL<20 in jan 2024 currently on biktarvy. 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   amLODipine-olmesartan (AZOR) 5-20 MG tablet Take 1 tablet by mouth daily.   bictegravir-emtricitabine-tenofovir AF (BIKTARVY) 50-200-25 MG TABS tablet Take 1 tablet by mouth daily.   lithium 300 MG tablet Take 300 mg by mouth daily.   meloxicam (MOBIC) 15 MG tablet Take 15 mg by mouth daily.   montelukast (SINGULAIR) 10 MG tablet Take 1 tablet by mouth at bedtime.   Multiple Vitamins-Minerals (MULTIVITAMIN WITH MINERALS) tablet Take 1 tablet by mouth daily.   omeprazole (PRILOSEC) 40 MG capsule Take 1 capsule (40 mg total) by mouth daily. Take twice a day for 3 months, then decrease to once a day   oxymetazoline (AFRIN) 0.05 % nasal spray Place 1 spray into both nostrils 3 (three) times daily as needed for congestion.   QUEtiapine (SEROQUEL) 100 MG tablet Take 200 mg by mouth at bedtime.   tadalafil (CIALIS) 10 MG tablet Take 10 mg by mouth daily as needed.   fenofibrate (TRICOR) 48 MG tablet Take 1 tablet (48 mg total) by mouth daily. (Patient not taking: Reported on 01/13/2023)   No facility-administered encounter medications on file as of 01/13/2023.     Patient Active Problem List   Diagnosis Date Noted   Dysphagia 09/13/2021   Bloating 09/13/2021   Homelessness 06/27/2013   Weakness generalized  06/27/2013   Abnormal TSH 06/27/2013   Anemia 06/27/2013   Fever and chills 06/25/2013   PNA (pneumonia) 06/25/2013   Anxiety 06/10/2013   H/O: substance abuse 06/09/2013   Malnutrition of moderate degree 05/21/2013   Human immunodeficiency virus (HIV) disease 05/21/2013   Orthostatic hypotension 05/20/2013   Chest pain on exertion 05/20/2013   Weight loss 05/20/2013   Bipolar disorder 05/20/2013   GERD (gastroesophageal reflux disease) 05/20/2013   Hypotension 01/03/2012   ARF (acute renal failure) 01/03/2012   Rash 01/03/2012   HTN (hypertension) 01/03/2012     Health Maintenance Due  Topic Date Due   Zoster Vaccines- Shingrix (1 of 2) Never done   COVID-19 Vaccine (5 - 2023-24 season) 10/29/2022     Review of Systems 12 point ros is negative except what is mentioned above Physical Exam   BP 119/81   Pulse 70   Temp 97.8 F (36.6 C) (Oral)   Wt 190 lb (86.2 kg)   SpO2 96%   BMI 27.26 kg/m   Physical Exam  Constitutional: He is oriented to person, place, and time. He appears well-developed and well-nourished. No distress.  HENT:  Mouth/Throat: Oropharynx is clear and moist. No oropharyngeal exudate.  Cardiovascular: Normal rate, regular rhythm and normal heart sounds. Exam reveals no gallop and no friction rub.  No murmur heard.  Pulmonary/Chest: Effort normal and breath sounds normal. No respiratory distress. He has no wheezes.  Abdominal: Soft. Bowel sounds are normal. He exhibits no distension. There is no tenderness.  Lymphadenopathy:  He has no cervical adenopathy.  Neurological: He is alert and oriented to person, place, and time.  Skin: Skin is warm and dry. No rash noted. No erythema.  Psychiatric: He has a normal mood and affect. His behavior is normal.   Lab Results  Component Value Date   CD4TCELL 27 (L) 10/09/2022   Lab Results  Component Value Date   CD4TABS 291 (L) 10/09/2022   CD4TABS 153 (L) 08/27/2022   CD4TABS 329 (L) 05/15/2021   Lab  Results  Component Value Date   HIV1RNAQUANT <20 (H) 10/09/2022   Lab Results  Component Value Date   HEPBSAB NEG 06/03/2013   Lab Results  Component Value Date   LABRPR NON-REACTIVE 08/27/2022    CBC Lab Results  Component Value Date   WBC 3.1 (L) 08/27/2022   RBC 4.30 08/27/2022   HGB 14.0 08/27/2022   HCT 41.1 08/27/2022   PLT 174 08/27/2022   MCV 95.6 08/27/2022   MCH 32.6 08/27/2022   MCHC 34.1 08/27/2022   RDW 12.8 08/27/2022   LYMPHSABS 905 08/27/2022   MONOABS 516 05/14/2017   EOSABS 59 08/27/2022    BMET Lab Results  Component Value Date   NA 139 08/27/2022   K 3.7 08/27/2022   CL 102 08/27/2022   CO2 28 08/27/2022   GLUCOSE 96 08/27/2022   BUN 13 08/27/2022   CREATININE 0.99 08/27/2022   CALCIUM 9.3 08/27/2022   GFRNONAA 69 11/27/2020   GFRAA 80 11/27/2020      Assessment and Plan HIV disease = Will get labs to see if still undetectable  Long term medication management = will check cr  Health maintenance= Check lipid profile Give rosuvastatin 10mg  as primary prevention  Carpal tunnel syndrome = for neuropathic pain, Will give gabapentin at bedtime to see if helps until he gets referall to surgery

## 2023-01-14 LAB — T-HELPER CELL (CD4) - (RCID CLINIC ONLY)
CD4 % Helper T Cell: 28 % — ABNORMAL LOW (ref 33–65)
CD4 T Cell Abs: 307 /uL — ABNORMAL LOW (ref 400–1790)

## 2023-01-16 LAB — CBC WITH DIFFERENTIAL/PLATELET
Absolute Monocytes: 664 cells/uL (ref 200–950)
Basophils Absolute: 81 cells/uL (ref 0–200)
Basophils Relative: 1 %
Eosinophils Absolute: 275 cells/uL (ref 15–500)
Eosinophils Relative: 3.4 %
HCT: 44.1 % (ref 38.5–50.0)
Hemoglobin: 15.2 g/dL (ref 13.2–17.1)
Lymphs Abs: 1320 cells/uL (ref 850–3900)
MCH: 33 pg (ref 27.0–33.0)
MCHC: 34.5 g/dL (ref 32.0–36.0)
MCV: 95.9 fL (ref 80.0–100.0)
MPV: 10.9 fL (ref 7.5–12.5)
Monocytes Relative: 8.2 %
Neutro Abs: 5759 cells/uL (ref 1500–7800)
Neutrophils Relative %: 71.1 %
Platelets: 249 10*3/uL (ref 140–400)
RBC: 4.6 10*6/uL (ref 4.20–5.80)
RDW: 13 % (ref 11.0–15.0)
Total Lymphocyte: 16.3 %
WBC: 8.1 10*3/uL (ref 3.8–10.8)

## 2023-01-16 LAB — COMPLETE METABOLIC PANEL WITH GFR
AG Ratio: 1.8 (calc) (ref 1.0–2.5)
ALT: 14 U/L (ref 9–46)
AST: 14 U/L (ref 10–35)
Albumin: 4.1 g/dL (ref 3.6–5.1)
Alkaline phosphatase (APISO): 29 U/L — ABNORMAL LOW (ref 35–144)
BUN/Creatinine Ratio: 10 (calc) (ref 6–22)
BUN: 15 mg/dL (ref 7–25)
CO2: 27 mmol/L (ref 20–32)
Calcium: 9.7 mg/dL (ref 8.6–10.3)
Chloride: 106 mmol/L (ref 98–110)
Creat: 1.48 mg/dL — ABNORMAL HIGH (ref 0.70–1.30)
Globulin: 2.3 g/dL (calc) (ref 1.9–3.7)
Glucose, Bld: 84 mg/dL (ref 65–99)
Potassium: 4.5 mmol/L (ref 3.5–5.3)
Sodium: 138 mmol/L (ref 135–146)
Total Bilirubin: 0.5 mg/dL (ref 0.2–1.2)
Total Protein: 6.4 g/dL (ref 6.1–8.1)
eGFR: 57 mL/min/{1.73_m2} — ABNORMAL LOW (ref >=60)

## 2023-01-16 LAB — LIPID PANEL
Cholesterol: 145 mg/dL (ref <200)
HDL: 39 mg/dL — ABNORMAL LOW (ref >=40)
Non-HDL Cholesterol (Calc): 106 mg/dL (calc) (ref <130)
Total CHOL/HDL Ratio: 3.7 (calc) (ref <5.0)
Triglycerides: 120 mg/dL (ref <150)

## 2023-01-16 LAB — HIV-1 RNA QUANT-NO REFLEX-BLD
HIV 1 RNA Quant: NOT DETECTED Copies/mL
HIV-1 RNA Quant, Log: NOT DETECTED Log cps/mL

## 2023-01-16 LAB — RPR: RPR Ser Ql: NONREACTIVE

## 2023-04-14 ENCOUNTER — Ambulatory Visit (INDEPENDENT_AMBULATORY_CARE_PROVIDER_SITE_OTHER): Payer: Self-pay | Admitting: Internal Medicine

## 2023-04-14 ENCOUNTER — Other Ambulatory Visit: Payer: Self-pay

## 2023-04-14 ENCOUNTER — Encounter: Payer: Self-pay | Admitting: Internal Medicine

## 2023-04-14 VITALS — BP 138/88 | HR 82 | Resp 16 | Ht 70.0 in | Wt 182.0 lb

## 2023-04-14 DIAGNOSIS — E785 Hyperlipidemia, unspecified: Secondary | ICD-10-CM

## 2023-04-14 DIAGNOSIS — Z79899 Other long term (current) drug therapy: Secondary | ICD-10-CM

## 2023-04-14 DIAGNOSIS — N179 Acute kidney failure, unspecified: Secondary | ICD-10-CM

## 2023-04-14 DIAGNOSIS — B20 Human immunodeficiency virus [HIV] disease: Secondary | ICD-10-CM

## 2023-04-14 DIAGNOSIS — Z789 Other specified health status: Secondary | ICD-10-CM

## 2023-04-14 LAB — BASIC METABOLIC PANEL
BUN: 11 mg/dL (ref 7–25)
CO2: 28 mmol/L (ref 20–32)
Chloride: 104 mmol/L (ref 98–110)
Glucose, Bld: 86 mg/dL (ref 65–99)
Potassium: 4.1 mmol/L (ref 3.5–5.3)
Sodium: 137 mmol/L (ref 135–146)

## 2023-04-14 MED ORDER — BIKTARVY 50-200-25 MG PO TABS: 1.0000 | ORAL_TABLET | Freq: Every day | ORAL | 11 refills | Status: DC

## 2023-04-14 NOTE — Progress Notes (Signed)
RFV: follow up for hiv disease  Patient ID: Ryan [name], male   DOB: 08/09/1971, 52 y.o.   MRN: [id number]  HPI 52yo M with well controlled hiv disease, currently on biktarvy; also Rheumatoid Arthritis; Has been thinking about being diagnosed 10 years ago with HIV disease.  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  No issues with taking crestor.   Had aki earlier this year ---due to ibuprofen overuse   Past med hx: Colonsocopy a year -  Hx of cdifficile   Does not have health insurance presently   Outpatient Encounter Medications as of 04/14/2023  Medication Sig   acetaminophen (TYLENOL) 500 MG tablet Take 1,000 mg by mouth every 8 (eight) hours as needed for moderate pain.   amLODipine-olmesartan (AZOR) 5-20 MG tablet Take 1 tablet by mouth daily.   bictegravir-emtricitabine-tenofovir AF (BIKTARVY) 50-200-25 MG TABS tablet Take 1 tablet by mouth daily.   fenofibrate (TRICOR) 48 MG tablet Take 1 tablet (48 mg total) by mouth daily. (Patient not taking: Reported on 01/13/2023)   gabapentin (NEURONTIN) 300 MG capsule Take 1 capsule (300 mg total) by mouth at bedtime.   lithium 300 MG tablet Take 300 mg by mouth daily.   meloxicam (MOBIC) 15 MG tablet Take 15 mg by mouth daily.   montelukast (SINGULAIR) 10 MG tablet Take 1 tablet by mouth at bedtime.   Multiple Vitamins-Minerals (MULTIVITAMIN WITH MINERALS) tablet Take 1 tablet by mouth daily.   omeprazole (PRILOSEC) 40 MG capsule Take 1 capsule (40 mg total) by mouth daily. Take twice a day for 3 months, then decrease to once a day   oxymetazoline (AFRIN) 0.05 % nasal spray Place 1 spray into both nostrils 3 (three) times daily as needed for congestion.   QUEtiapine (SEROQUEL) 100 MG tablet Take 200 mg by mouth at bedtime.   rosuvastatin (CRESTOR) 10 MG tablet Take 1 tablet (10 mg total) by mouth daily.   tadalafil (CIALIS) 10 MG tablet Take 10 mg by mouth daily as needed.   No  facility-administered encounter medications on file as of 04/14/2023.     Patient Active Problem List   Diagnosis Date Noted   Dysphagia 09/13/2021   Bloating 09/13/2021   Homelessness 06/27/2013   Weakness generalized 06/27/2013   Abnormal TSH 06/27/2013   Anemia 06/27/2013   Fever and chills 06/25/2013   PNA (pneumonia) 06/25/2013   Anxiety 06/10/2013   H/O: substance abuse (HCC) 06/09/2013   Malnutrition of moderate degree (HCC) 05/21/2013   Human immunodeficiency virus (HIV) disease (HCC) 05/21/2013   Orthostatic hypotension 05/20/2013   Chest pain on exertion 05/20/2013   Weight loss 05/20/2013   Bipolar disorder (HCC) 05/20/2013   GERD (gastroesophageal reflux disease) 05/20/2013   Hypotension 01/03/2012   ARF (acute renal failure) (HCC) 01/03/2012   Rash 01/03/2012   HTN (hypertension) 01/03/2012     Health Maintenance Due  Topic Date Due   Zoster Vaccines- Shingrix (1 of 2) Never done   COVID-19 Vaccine (5 - 2023-24 season) 10/29/2022     Review of Systems Review of Systems  Constitutional: Negative for fever, chills, diaphoresis, activity change, appetite change, fatigue and unexpected weight change.  HENT: Negative for congestion, sore throat, rhinorrhea, sneezing, trouble swallowing and sinus pressure.  Eyes: Negative for photophobia and visual disturbance.  Respiratory: Negative for cough, chest tightness, shortness of breath, wheezing and stridor.  Cardiovascular: Negative for chest pain, palpitations and leg swelling.  Gastrointestinal: Negative for nausea, vomiting, abdominal  pain, diarrhea, constipation, blood in stool, abdominal distention and anal bleeding.  Genitourinary: Negative for dysuria, hematuria, flank pain and difficulty urinating.  Musculoskeletal: Negative for myalgias, back pain, joint swelling, arthralgias and gait problem.  Skin: Negative for color change, pallor, rash and wound.  Neurological: Negative for dizziness, tremors, weakness and  light-headedness.  Hematological: Negative for adenopathy. Does not bruise/bleed easily.  Psychiatric/Behavioral: Negative for behavioral problems, confusion, sleep disturbance, dysphoric mood, decreased concentration and agitation.   Physical Exam   BP 138/88   Pulse 82   Resp 16   Ht 5\' 10"  (1.778 m)   Wt 182 lb (82.6 kg)   BMI 26.11 kg/m  Physical Exam  Constitutional: He is oriented to person, place, and time. He appears well-developed and well-nourished. No distress.  HENT:  Mouth/Throat: Oropharynx is clear and moist. No oropharyngeal exudate.  Cardiovascular: Normal rate, regular rhythm and normal heart sounds. Exam reveals no gallop and no friction rub.  No murmur heard.  Pulmonary/Chest: Effort normal and breath sounds normal. No respiratory distress. He has no wheezes.  Abdominal: Soft. Bowel sounds are normal. He exhibits no distension. There is no tenderness.  Lymphadenopathy:  He has no cervical adenopathy.  Neurological: He is alert and oriented to person, place, and time.  Skin: Skin is warm and dry. No rash noted. No erythema.  Psychiatric: He has a normal mood and affect. His behavior is normal.   Lab Results  Component Value Date   CD4TCELL 28 (L) 01/13/2023   Lab Results  Component Value Date   CD4TABS 307 (L) 01/13/2023   CD4TABS 291 (L) 10/09/2022   CD4TABS 153 (L) 08/27/2022   Lab Results  Component Value Date   HIV1RNAQUANT Not Detected 01/13/2023   Lab Results  Component Value Date   HEPBSAB NEG 06/03/2013   Lab Results  Component Value Date   LABRPR NON-REACTIVE 01/13/2023    CBC Lab Results  Component Value Date   WBC 8.1 01/13/2023   RBC 4.60 01/13/2023   HGB 15.2 01/13/2023   HCT 44.1 01/13/2023   PLT 249 01/13/2023   MCV 95.9 01/13/2023   MCH 33.0 01/13/2023   MCHC 34.5 01/13/2023   RDW 13.0 01/13/2023   LYMPHSABS 1,320 01/13/2023   MONOABS 516 05/14/2017   EOSABS 275 01/13/2023    BMET Lab Results  Component Value Date    NA 138 01/13/2023   K 4.5 01/13/2023   CL 106 01/13/2023   CO2 27 01/13/2023   GLUCOSE 84 01/13/2023   BUN 15 01/13/2023   CREATININE 1.48 (H) 01/13/2023   CALCIUM 9.7 01/13/2023   GFRNONAA 69 11/27/2020   GFRAA 80 11/27/2020      Assessment and Plan HIV disease = will check Cd 4 count and hiv vl. Anticipate that he is still well controlled; will give refills  Aki/long term medication = Will check cr  HLD= continue on crestor - recommend diet modification

## 2023-04-15 LAB — T-HELPER CELLS (CD4) COUNT (NOT AT ARMC): Total lymphocyte count: 803 cells/uL — ABNORMAL LOW (ref 850–3900)

## 2023-04-17 LAB — BASIC METABOLIC PANEL
Calcium: 9.4 mg/dL (ref 8.6–10.3)
Creat: 1.08 mg/dL (ref 0.70–1.30)

## 2023-04-17 LAB — T-HELPER CELLS (CD4) COUNT (NOT AT ARMC)
Absolute CD4: 291 cells/uL — ABNORMAL LOW (ref 490–1740)
CD4 T Helper %: 36 % (ref 30–61)

## 2023-04-17 LAB — HIV-1 RNA QUANT-NO REFLEX-BLD
HIV 1 RNA Quant: NOT DETECTED Copies/mL
HIV-1 RNA Quant, Log: NOT DETECTED Log cps/mL

## 2023-04-27 ENCOUNTER — Other Ambulatory Visit: Payer: Self-pay | Admitting: Internal Medicine

## 2023-04-28 NOTE — Telephone Encounter (Signed)
Please advise if okay to refill. 

## 2023-04-29 ENCOUNTER — Telehealth: Payer: Self-pay

## 2023-04-29 NOTE — Telephone Encounter (Signed)
Received refill request for Gabapentin 300 mg. Will defer request to PCP.  Per note 01/13/23 patient discussed carpal tunnel with them and surgery. Juanita Laster, RMA

## 2023-05-13 ENCOUNTER — Other Ambulatory Visit: Payer: Self-pay | Admitting: Internal Medicine

## 2023-05-13 NOTE — Telephone Encounter (Signed)
Patient has PCP who should assume management of gabapentin.   Sandie Ano, RN

## 2023-05-20 ENCOUNTER — Telehealth: Payer: Self-pay | Admitting: Internal Medicine

## 2023-05-26 ENCOUNTER — Encounter (INDEPENDENT_AMBULATORY_CARE_PROVIDER_SITE_OTHER): Payer: Self-pay | Admitting: Gastroenterology

## 2023-05-26 ENCOUNTER — Encounter: Payer: Self-pay | Admitting: *Deleted

## 2023-05-26 ENCOUNTER — Ambulatory Visit (INDEPENDENT_AMBULATORY_CARE_PROVIDER_SITE_OTHER): Payer: MEDICAID | Admitting: Gastroenterology

## 2023-05-26 VITALS — BP 134/80 | HR 74 | Temp 98.4°F | Ht 69.0 in | Wt 176.7 lb

## 2023-05-26 DIAGNOSIS — R1319 Other dysphagia: Secondary | ICD-10-CM | POA: Diagnosis not present

## 2023-05-26 DIAGNOSIS — K222 Esophageal obstruction: Secondary | ICD-10-CM | POA: Insufficient documentation

## 2023-05-26 DIAGNOSIS — K2100 Gastro-esophageal reflux disease with esophagitis, without bleeding: Secondary | ICD-10-CM | POA: Diagnosis not present

## 2023-05-26 MED ORDER — OMEPRAZOLE 40 MG PO CPDR
40.0000 mg | DELAYED_RELEASE_CAPSULE | Freq: Every day | ORAL | 3 refills | Status: DC
Start: 2023-05-26 — End: 2024-05-25

## 2023-05-26 NOTE — Progress Notes (Signed)
Ryan [name], M.D. Gastroenterology & Hepatology [facility] Gastroenterology [address] [place], [place] [address]  Primary Care Physician: Beatrix Fetters, MD [address] [place] [address]  I will communicate my assessment and recommendations to the referring MD via EMR.  Problems: Dysphagia secondary to Schatzki's ring GERD with esophagitis  History of Present Illness: Ryan [name] is a 52 y.o. male  with PMH c. Diff x1, alpha gal allergy, HIV, bipolar disorder, hypertension, depression, who presents for evaluation of dysphagia.  The patient was last seen on 09/13/2021. At that time, the patient was scheduled for EGD and colonoscopy.  He was also started on omeprazole 20 mg every day.  He was advised to use a low FODMAP diet as needed for bloating episodes.  TSH and celiac disease panel were normal.  Patient reports that for the last 4 months he has presented dysphagia with drys foods, steak, bread a nd tuna. No dysphagia with liquids. He is having heartburn a couple of times a week. He is taking omeprazole 40 mg qday, but reports that he forgets some days to take it. Takes it at any point of the day when he remembers to take it. No odynophagia.  The patient denies having any nausea, vomiting, fever, chills, hematochezia, melena, hematemesis, abdominal distention, abdominal pain, diarrhea, jaundice, pruritus. Has lost some weight as he has been working out several times a week, which led to weight loss.  Last EGD: 09/20/2021 Grade B esophagitis, presence of a Schatzki's ring which was dilated up to 15 mm, 2 cm hiatal hernia, erosions in the stomach and erythema in the duodenal bulb.  Gastric biopsies were within normal limits.  Last Colonoscopy: 09/20/2021 6 mm polyp in the ascending colon.  Multiple diverticula in the sigmoid, descending and ascending colon.  Internal hemorrhoids. Pathology consistent with tubular adenoma. Recommended repeat  colonoscopy in 5 years  Past Medical History: Past Medical History:  Diagnosis Date   Bipolar disorder (HCC)    Depressed    GERD (gastroesophageal reflux disease)    H/O: substance abuse (HCC) 06/09/2013   Crack cocaine,  marijuana   HIV (human immunodeficiency virus infection) (HCC)    Hypertension     Past Surgical History: Past Surgical History:  Procedure Laterality Date   BALLOON DILATION  09/21/2021   Procedure: BALLOON DILATION;  Surgeon: Dolores Frame, MD;  [facility];  Service: Gastroenterology;;   BIOPSY  09/21/2021   Procedure: BIOPSY;  Surgeon: Dolores Frame, MD;  [facility]: [facility];  Service: Gastroenterology;;   COLONOSCOPY WITH PROPOFOL N/A 09/21/2021   Procedure: COLONOSCOPY WITH PROPOFOL;  Surgeon: Dolores Frame, MD;  [facility]: [facility];  Service: Gastroenterology;  Laterality: N/A;  11:10   ESOPHAGOGASTRODUODENOSCOPY (EGD) WITH PROPOFOL N/A 09/21/2021   Procedure: ESOPHAGOGASTRODUODENOSCOPY (EGD) WITH PROPOFOL;  Surgeon: Dolores Frame, MD;  [facility]: [facility];  Service: Gastroenterology;  Laterality: N/A;   FINGER SURGERY     POLYPECTOMY  09/21/2021   Procedure: POLYPECTOMY;  Surgeon: Dolores Frame, MD;  Location: [facility];  Service: Gastroenterology;;    Family History: Family History  Problem Relation Age of Onset   Esophageal cancer Father     Social History: Social History   Tobacco Use  Smoking Status Every Day   Current packs/day: 0.25   Types: Cigarettes  Smokeless Tobacco Never   Social History   Substance and Sexual Activity  Alcohol Use Yes   Comment: occasionally  Social History   Substance and Sexual Activity  Drug Use No   Types: Cocaine, Marijuana, Other-see comments   Comment: presription pills - history but has not done any in 1 year    Allergies: Allergies  Allergen Reactions   Levaquin [Levofloxacin In D5w] Other (See  Comments)    Pain & nerve damage in fingers   Other     Red meat - Alpha Gal   Sulfa Antibiotics Rash    Medications: Current Outpatient Medications  Medication Sig Dispense Refill   acetaminophen (TYLENOL) 500 MG tablet Take 1,000 mg by mouth every 8 (eight) hours as needed for moderate pain.     amLODipine-olmesartan (AZOR) 5-20 MG tablet Take 1 tablet by mouth daily.     bictegravir-emtricitabine-tenofovir AF (BIKTARVY) 50-200-25 MG TABS tablet Take 1 tablet by mouth daily. 30 tablet 11   fenofibrate (TRICOR) 48 MG tablet Take 1 tablet (48 mg total) by mouth daily. 30 tablet 11   gabapentin (NEURONTIN) 300 MG capsule Take 1 capsule (300 mg total) by mouth at bedtime. 60 capsule 0   lithium 300 MG tablet Take 300 mg by mouth daily.     meloxicam (MOBIC) 15 MG tablet Take 15 mg by mouth daily.     montelukast (SINGULAIR) 10 MG tablet Take 1 tablet by mouth at bedtime.     omeprazole (PRILOSEC) 40 MG capsule Take 1 capsule (40 mg total) by mouth daily. Take twice a day for 3 months, then decrease to once a day 180 capsule 3   oxymetazoline (AFRIN) 0.05 % nasal spray Place 1 spray into both nostrils 3 (three) times daily as needed for congestion.     QUEtiapine (SEROQUEL) 100 MG tablet Take 200 mg by mouth at bedtime.     rosuvastatin (CRESTOR) 10 MG tablet Take 1 tablet (10 mg total) by mouth daily. 30 tablet 11   tadalafil (CIALIS) 10 MG tablet Take 10 mg by mouth daily as needed.     No current facility-administered medications for this visit.    Review of Systems: GENERAL: negative for malaise, night sweats HEENT: No changes in hearing or vision, no nose bleeds or other nasal problems. NECK: Negative for lumps, goiter, pain and significant neck swelling RESPIRATORY: Negative for cough, wheezing CARDIOVASCULAR: Negative for chest pain, leg swelling, palpitations, orthopnea GI: SEE HPI MUSCULOSKELETAL: Negative for joint pain or swelling, back pain, and muscle pain. SKIN: Negative  for lesions, rash PSYCH: Negative for sleep disturbance, mood disorder and recent psychosocial stressors. HEMATOLOGY Negative for prolonged bleeding, bruising easily, and swollen nodes. ENDOCRINE: Negative for cold or heat intolerance, polyuria, polydipsia and goiter. NEURO: negative for tremor, gait imbalance, syncope and seizures. The remainder of the review of systems is noncontributory.   Physical Exam: BP 134/80 (BP Location: Left Arm, Patient Position: Sitting, Cuff Size: Normal)   Pulse 74   Temp 98.4 F (36.9 C) (Temporal)   Ht 5\' 9"  (1.753 m)   Wt 176 lb 11.2 oz (80.2 kg)   BMI 26.09 kg/m  GENERAL: The patient is AO x3, in no acute distress. HEENT: Head is normocephalic and atraumatic. EOMI are intact. Mouth is well hydrated and without lesions. NECK: Supple. No masses LUNGS: Clear to auscultation. No presence of rhonchi/wheezing/rales. Adequate chest expansion HEART: RRR, normal s1 and s2. ABDOMEN: Soft, nontender, no guarding, no peritoneal signs, and nondistended. BS +. No masses. EXTREMITIES: Without any cyanosis, clubbing, rash, lesions or edema. NEUROLOGIC: AOx3, no focal motor deficit. SKIN: no jaundice, no rashes  Imaging/Labs: as above  I personally reviewed and interpreted the available labs, imaging and endoscopic files.  Impression and Plan: RISHAB BIAGIONI is a 52 y.o. male  with PMH c. Diff x1, alpha gal allergy, HIV, bipolar disorder, hypertension, depression, who presents for evaluation of dysphagia.  Patient has presented recurrence of dysphagia to solids.  He has a documented history of Schatzki's ring and esophagitis on his previous EGD.  Unfortunately, he has not been 100% compliant with this medication and is still presenting some episodes of GERD.  I suspect his Schatzki's ring is back and he may have a component of esophagitis.  I insisted to patient importance of taking the medication regularly and at the right time we will go today.  At this point  we will proceed with EGD to further evaluate his symptoms.  - Schedule EGD - Take omeprazole 40 mg qday - Explained presumed etiology of reflux symptoms. Instruction provided in the use of antireflux medication - patient should take medication in the morning 30-45 minutes before eating breakfast. Discussed avoidance of eating within 2 hours of lying down to sleep and benefit of blocks to elevate head of bed. Also, will benefit from avoiding carbonated drinks/sodas or food that has tomatoes, spicy or greasy food.  All questions were answered.      Ryan Blazing, MD Gastroenterology and Hepatology [facility] Gastroenterology

## 2023-05-26 NOTE — Patient Instructions (Addendum)
Schedule EGD Take omeprazole 40 mg qday Explained presumed etiology of reflux symptoms. Instruction provided in the use of antireflux medication - patient should take medication in the morning 30-45 minutes before eating breakfast. Discussed avoidance of eating within 2 hours of lying down to sleep and benefit of blocks to elevate head of bed. Also, will benefit from avoiding carbonated drinks/sodas or food that has tomatoes, spicy or greasy food.

## 2023-06-06 ENCOUNTER — Telehealth (INDEPENDENT_AMBULATORY_CARE_PROVIDER_SITE_OTHER): Payer: Self-pay | Admitting: Gastroenterology

## 2023-06-06 NOTE — Telephone Encounter (Signed)
Pt called in and states that he is starting a new job on Tuesday and will need to cancel EGD on 06/11/23. Pt states she will call back to reschedule.

## 2023-06-11 ENCOUNTER — Encounter (HOSPITAL_COMMUNITY): Admission: RE | Payer: Self-pay | Source: Home / Self Care

## 2023-06-11 ENCOUNTER — Ambulatory Visit (HOSPITAL_COMMUNITY): Admission: RE | Admit: 2023-06-11 | Payer: MEDICAID | Source: Home / Self Care | Admitting: Gastroenterology

## 2023-06-11 SURGERY — ESOPHAGOGASTRODUODENOSCOPY (EGD) WITH PROPOFOL
Anesthesia: Monitor Anesthesia Care

## 2023-11-14 ENCOUNTER — Telehealth: Payer: Self-pay

## 2023-11-14 NOTE — Telephone Encounter (Signed)
 Patient called stating he has covid and was prescribed Paxlovid. Patient has concerns that the Paxlovid will interact with his Biktarvy .  Per Dr. Luiz the patient will be ok to take the Paxlovid.  Patient informed and verbalized understanding.  Ryan [name] Ryan [name], CMA

## 2023-11-18 ENCOUNTER — Other Ambulatory Visit: Payer: Self-pay | Admitting: Internal Medicine

## 2023-11-18 MED ORDER — ALBUTEROL SULFATE HFA 108 (90 BASE) MCG/ACT IN AERS: 2.0000 | INHALATION_SPRAY | Freq: Four times a day (QID) | RESPIRATORY_TRACT | 6 refills | Status: AC | PRN

## 2023-11-18 NOTE — Telephone Encounter (Signed)
Patient's wife called back, states the urgent care has now added amoxicillin to patient's treatment plan and told them that [name] has pneumonia in both lungs.   Sandie Ano, RN

## 2023-11-18 NOTE — Telephone Encounter (Signed)
Called patient's wife to let her know Dr. Drue Second is sending in inhaler, no answer. Called patient to let him know, no answer. Left voicemail stating inhaler was sent to [facility] in [place] and to call with any questions.   Sandie Ano, RN

## 2023-11-18 NOTE — Telephone Encounter (Signed)
Patient and his wife called wanting to know if Dr. Drue Second could send in a rescue inhaler. States Gomer has COVID, is currently taking Paxlovid. Was prescribed a z-pack by urgent care as CXR showed possible "walking pneumonia." He has not started the azithromycin yet. He is using a nebulizer.   Discussed with patient's wife that if Ziah's symptoms start to worsen or he has difficulty breathing that he should go back to urgent care/emergency department. She verbalized understanding.   Sandie Ano, RN

## 2024-03-05 NOTE — Progress Notes (Signed)
 The 10-year ASCVD risk score (Arnett DK, et al., 2019) is: 8.4%   Values used to calculate the score:     Age: 53 years     Sex: Male     Is Non-Hispanic African American: No     Diabetic: No     Tobacco smoker: Yes     Systolic Blood Pressure: 122 mmHg     Is BP treated: Yes     HDL Cholesterol: 39 mg/dL     Total Cholesterol: 145 mg/dL  Currently prescribed rosuvastatin  10 mg.  Selene Peltzer, BSN, RN

## 2024-04-26 ENCOUNTER — Other Ambulatory Visit: Payer: Self-pay | Admitting: Internal Medicine

## 2024-04-26 DIAGNOSIS — B20 Human immunodeficiency virus [HIV] disease: Secondary | ICD-10-CM

## 2024-04-26 NOTE — Telephone Encounter (Signed)
 Due for appt

## 2024-05-25 ENCOUNTER — Telehealth: Payer: Self-pay | Admitting: *Deleted

## 2024-05-25 ENCOUNTER — Ambulatory Visit (INDEPENDENT_AMBULATORY_CARE_PROVIDER_SITE_OTHER): Payer: MEDICAID | Admitting: Gastroenterology

## 2024-05-25 ENCOUNTER — Encounter (INDEPENDENT_AMBULATORY_CARE_PROVIDER_SITE_OTHER): Payer: Self-pay | Admitting: Gastroenterology

## 2024-05-25 ENCOUNTER — Encounter: Payer: Self-pay | Admitting: *Deleted

## 2024-05-25 VITALS — BP 129/81 | HR 71 | Temp 98.7°F | Ht 69.0 in | Wt 183.9 lb

## 2024-05-25 DIAGNOSIS — R1310 Dysphagia, unspecified: Secondary | ICD-10-CM | POA: Diagnosis not present

## 2024-05-25 DIAGNOSIS — K219 Gastro-esophageal reflux disease without esophagitis: Secondary | ICD-10-CM

## 2024-05-25 DIAGNOSIS — R1319 Other dysphagia: Secondary | ICD-10-CM

## 2024-05-25 DIAGNOSIS — K2100 Gastro-esophageal reflux disease with esophagitis, without bleeding: Secondary | ICD-10-CM

## 2024-05-25 DIAGNOSIS — Z8719 Personal history of other diseases of the digestive system: Secondary | ICD-10-CM | POA: Diagnosis not present

## 2024-05-25 MED ORDER — OMEPRAZOLE 40 MG PO CPDR: 40.0000 mg | DELAYED_RELEASE_CAPSULE | Freq: Two times a day (BID) | ORAL | 2 refills | Status: DC

## 2024-05-25 NOTE — Progress Notes (Signed)
 Referring Provider: Kotturi, Vinay K, MD Primary Care Physician:  Ryan Isles, MD Primary GI Physician: Dr. Eartha   Chief Complaint  Patient presents with   Follow-up    Patient here [name] for a follow up. Patient says he has been having issues with dysphagia, on going for the last few months. He is taking omeprazole  40 mg once per day. Has occasional issues with diarrhea.    HPI:   Ryan [name] is a 53 y.o. male with past medical history of c. Diff x1, alpha gal allergy, HIV, bipolar disorder, hypertension, depression   Patient presenting [name] for:  Follow up of GERD and dysphagia   Last seen August 2024, at that time having more dysphagia with meats, heartburn a few times per week, taking omeprazole  40mg  daily but sometimes forgets his dose.   Recommended to schedule EGD, cotninue omeprazole  40mg  daily  EGD was scheduled for September 2024 but was cancelled  Present:  Reports worsening dysphagia over the past few months with almost anything he eats. No issues with liquids. Some heartburn more recently too maybe 1-2 times per week. He occasionally takes PPI twice a day if symptoms are flaring, does endorse sometimes forgetting morning dose. Drier foods such as meat tend to bother him more. Feels sensation of food sitting in mid chest. Sometimes will have to go to the restroom and regurgitate food back up to alleviate symptoms. Denies any odynophagia.   Patient denies melena, hematochezia, nausea, vomiting, diarrhea, constipation, odyonophagia, early satiety or weight loss.   Last EGD: 09/20/2021 Grade B esophagitis, presence of a Schatzki's ring which was dilated up to 15 mm, 2 cm hiatal hernia, erosions in the stomach and erythema in the duodenal bulb.  Gastric biopsies were within normal limits.   Last Colonoscopy: 09/20/2021 6 mm polyp in the ascending colon.  Multiple diverticula in the sigmoid, descending and ascending colon.  Internal hemorrhoids. Pathology  consistent with tubular adenoma.  Recommended repeat colonoscopy in 5 years   Past Medical History:  Diagnosis Date   Bipolar disorder (HCC)    Depressed    GERD (gastroesophageal reflux disease)    H/O: substance abuse (HCC) 06/09/2013   Crack cocaine,  marijuana   HIV (human immunodeficiency virus infection) (HCC)    Hypertension     Past Surgical History:  Procedure Laterality Date   BALLOON DILATION  09/21/2021   Procedure: BALLOON DILATION;  Surgeon: Ryan [name] Angelia Sieving, MD;  [facility];  Service: Gastroenterology;;   BIOPSY  09/21/2021   Procedure: BIOPSY;  Surgeon: Ryan [name] Angelia Sieving, MD;  [facility];  Service: Gastroenterology;;   COLONOSCOPY WITH PROPOFOL  N/A 09/21/2021   Procedure: COLONOSCOPY WITH PROPOFOL ;  Surgeon: Ryan [name] Angelia Sieving, MD;  [facility];  Service: Gastroenterology;  Laterality: N/A;  11:10   ESOPHAGOGASTRODUODENOSCOPY (EGD) WITH PROPOFOL  N/A 09/21/2021   Procedure: ESOPHAGOGASTRODUODENOSCOPY (EGD) WITH PROPOFOL ;  Surgeon: Ryan [name] Angelia Sieving, MD;  [facility];  Service: Gastroenterology;  Laterality: N/A;   FINGER SURGERY     POLYPECTOMY  09/21/2021   Procedure: POLYPECTOMY;  Surgeon: Ryan [name] Angelia, Sieving, MD;  [facility];  Service: Gastroenterology;;    Current Outpatient Medications  Medication Sig Dispense Refill   acetaminophen  (TYLENOL ) 500 MG tablet Take 1,000 mg by mouth every 8 (eight) hours as needed for moderate pain.     albuterol  (VENTOLIN  HFA) 108 (90 Base) MCG/ACT inhaler Inhale 2 puffs into the lungs every 6 (six) hours as needed for  wheezing or shortness of breath. 8 g 6   amLODipine -olmesartan (AZOR) 5-20 MG tablet Take 1 tablet by mouth daily. (Patient taking differently: Take 1 tablet by mouth daily. 10-40 once per day per patient.)     BIKTARVY  50-200-25 MG TABS tablet TAKE 1 TABLET BY MOUTH DAILY 30 tablet 3   celecoxib (CELEBREX) 100 MG  capsule Take 100 mg by mouth 2 (two) times daily.     fenofibrate  (TRICOR ) 48 MG tablet Take 1 tablet (48 mg total) by mouth daily. 30 tablet 11   lithium  300 MG tablet Take 300 mg by mouth daily.     montelukast  (SINGULAIR ) 10 MG tablet Take 1 tablet by mouth at bedtime. (Patient taking differently: Take 1 tablet by mouth as needed.)     omeprazole  (PRILOSEC) 40 MG capsule Take 1 capsule (40 mg total) by mouth daily. . 90 capsule 3   oxymetazoline (AFRIN) 0.05 % nasal spray Place 1 spray into both nostrils 3 (three) times daily as needed for congestion.     QUEtiapine  (SEROQUEL ) 100 MG tablet Take 200 mg by mouth at bedtime.     rosuvastatin  (CRESTOR ) 10 MG tablet Take 1 tablet (10 mg total) by mouth daily. 30 tablet 11   tadalafil (CIALIS) 10 MG tablet Take 10 mg by mouth daily as needed.     No current facility-administered medications for this visit.    Allergies as of 05/25/2024 - Review Complete 05/25/2024  Allergen Reaction Noted   Levaquin [levofloxacin in d5w] Other (See Comments) 03/11/2013   Other  12/23/2019   Sulfa antibiotics Rash 01/03/2012    Social History   Socioeconomic History   Marital status: Married    Spouse name: Not on file   Number of children: Not on file   Years of education: Not on file   Highest education level: Not on file  Occupational History   Not on file  Tobacco Use   Smoking status: Every Day    Current packs/day: 0.25    Types: Cigarettes   Smokeless tobacco: Never  Vaping Use   Vaping status: Never Used  Substance and Sexual Activity   Alcohol use: Yes    Comment: occasionally   Drug use: No    Types: Cocaine, Marijuana, Other-see comments    Comment: presription pills - history but has not done any in 1 year   Sexual activity: Not Currently    Comment: declined condoms  Other Topics Concern   Not on file  Social History Narrative   Not on file   Social Drivers of Health   Financial Resource Strain: Low Risk  (03/03/2024)    Received [facility]   Overall Financial Resource Strain (CARDIA)    Difficulty of Paying Living Expenses: Not hard at all  Food Insecurity: No Food Insecurity (03/03/2024)   Received from [facility]   Hunger Vital Sign    Within the past 12 months, you worried that your food would run out before you got the money to buy more.: Never true    Within the past 12 months, the food you bought just didn't last and you didn't have money to get more.: Never true  Transportation Needs: No Transportation Needs (03/03/2024)   Received from [facility] - Transportation    In the past 12 months, has lack of transportation kept you from medical appointments or from getting medications?: No    Lack of Transportation (Non-Medical): No  Physical Activity: Insufficiently Active (12/28/2020)   Received from [facility]   Exercise Vital Sign    On average, how many days per week do you engage in moderate to strenuous exercise (like a brisk walk)?: 2 days    On average, how many minutes do you engage in exercise at this level?: 20 min  Stress: Stress Concern Present (12/11/2023)   Received from [facility] - Occupational Stress Questionnaire    Feeling of Stress : Rather much  Social Connections: Socially Integrated (06/06/2023)   Received from [facility]   Social Connection and Isolation Panel    In a typical week, how many times do you talk on the phone with family, friends, or neighbors?: Three times a week    How often do you get together with friends or relatives?: Three times a week    How often do you attend church or religious services?: 1 to 4 times per year    Do you belong to any clubs or organizations such as church groups, unions, fraternal or athletic groups, or school groups?: No    How often do you attend meetings of the clubs or organizations you belong to?: 1 to 4 times  per year    Are you married, widowed, divorced, separated, never married, or living with a partner?: Married    Review of systems General: negative for malaise, night sweats, fever, chills, weight los Neck: Negative for lumps, goiter, pain and significant neck swelling Resp: Negative for cough, wheezing, dyspnea at rest CV: Negative for chest pain, leg swelling, palpitations, orthopnea GI: denies melena, hematochezia, nausea, vomiting, diarrhea, constipation, odyonophagia, early satiety or unintentional weight loss. +dysphagia +heartburn MSK: Negative for joint pain or swelling, back pain, and muscle pain. Derm: Negative for itching or rash Psych: Denies depression, anxiety, memory loss, confusion. No homicidal or suicidal ideation.  Heme: Negative for prolonged bleeding, bruising easily, and swollen nodes. Endocrine: Negative for cold or heat intolerance, polyuria, polydipsia and goiter. Neuro: negative for tremor, gait imbalance, syncope and seizures. The remainder of the review of systems is noncontributory.  Physical Exam: BP 129/81 (BP Location: Left Arm, Patient Position: Sitting, Cuff Size: Normal)   Pulse 71   Temp 98.7 F (37.1 C) (Temporal)   Ht 5' 9 (1.753 m)   Wt 183 lb 14.4 oz (83.4 kg)   BMI 27.16 kg/m  General:   Alert and oriented. No distress noted. Pleasant and cooperative.  Head:  Normocephalic and atraumatic. Eyes:  Conjuctiva clear without scleral icterus. Mouth:  Oral mucosa pink and moist. Good dentition. No lesions. Heart: Normal rate and rhythm, s1 and s2 heart sounds present.  Lungs: Clear lung sounds in all lobes. Respirations equal and unlabored. Abdomen:  +BS, soft, non-tender and non-distended. No rebound or guarding. No HSM or masses noted. Derm: No palmar erythema or jaundice Msk:  Symmetrical without gross deformities. Normal posture. Extremities:  Without edema. Neurologic:  Alert and  oriented x4 Psych:  Alert and cooperative. Normal mood and  affect.  Invalid input(s): 6 MONTHS   ASSESSMENT: KALIX MEINECKE is a 53 y.o. male presenting [name] for follow up of GERD and Dysphagia  GERD/dysphagia: maintained on omeprazole  40mg  daily. Reports reflux symptoms with heartburn maybe 1-2 times per week. Ongoing dysphagia over the last few months. Notably scheduled for EGD last year for the same but this procedure was not completed. He has issues mostly with drier, thicker foods, no issues  with liquids. History of Schatzki's ring, last dilated in 2022. At this time, would recommend increasing PPI to BID, and scheduling EGD +/- dilation to rule out recurrent Schatzki's ring, esophageal web, stricture, stenosis, malignancy. Consider change in PPI therapy if BID dosing is not improving his symptoms. Indications, risks and benefits of procedure discussed in detail with patient. Patient verbalized understanding and is in agreement to proceed with EGD.   PLAN:  -increase omeprazole  40mg  to BID -consider change in PPI therapy if increased dosing not controlling symptoms  -schedule EGD ASA II  -chewing precautions   All questions were answered, patient verbalized understanding and is in agreement with plan as outlined above.    Follow Up: 3 months   Rochell Mabie L. Mariette, MSN, APRN, AGNP-C Adult-Gerontology Nurse Practitioner [facility]  I have reviewed the note and agree with the APP's assessment as described in this progress note  Toribio Fortune, MD Gastroenterology and Hepatology [facility] Gastroenterology

## 2024-05-25 NOTE — H&P (View-Only) (Signed)
 Referring Provider: Kotturi, Vinay K, MD Primary Care Physician:  Maree Isles, MD Primary GI Physician: Dr. Eartha   Chief Complaint  Patient presents with   Follow-up    Patient here today for a follow up. Patient says he has been having issues with dysphagia, on going for the last few months. He is taking omeprazole  40 mg once per day. Has occasional issues with diarrhea.    HPI:   Ryan [name] is a 53 y.o. male with past medical history of c. Diff x1, alpha gal allergy, HIV, bipolar disorder, hypertension, depression   Patient presenting today for:  Follow up of GERD and dysphagia   Last seen August 2024, at that time having more dysphagia with meats, heartburn a few times per week, taking omeprazole  40mg  daily but sometimes forgets his dose.   Recommended to schedule EGD, cotninue omeprazole  40mg  daily  EGD was scheduled for September 2024 but was cancelled  Present:  Reports worsening dysphagia over the past few months with almost anything he eats. No issues with liquids. Some heartburn more recently too maybe 1-2 times per week. He occasionally takes PPI twice a day if symptoms are flaring, does endorse sometimes forgetting morning dose. Drier foods such as meat tend to bother him more. Feels sensation of food sitting in mid chest. Sometimes will have to go to the restroom and regurgitate food back up to alleviate symptoms. Denies any odynophagia.   Patient denies melena, hematochezia, nausea, vomiting, diarrhea, constipation, odyonophagia, early satiety or weight loss.   Last EGD: 09/20/2021 Grade B esophagitis, presence of a Schatzki's ring which was dilated up to 15 mm, 2 cm hiatal hernia, erosions in the stomach and erythema in the duodenal bulb.  Gastric biopsies were within normal limits.   Last Colonoscopy: 09/20/2021 6 mm polyp in the ascending colon.  Multiple diverticula in the sigmoid, descending and ascending colon.  Internal hemorrhoids. Pathology  consistent with tubular adenoma.  Recommended repeat colonoscopy in 5 years   Past Medical History:  Diagnosis Date   Bipolar disorder (HCC)    Depressed    GERD (gastroesophageal reflux disease)    H/O: substance abuse (HCC) 06/09/2013   Crack cocaine,  marijuana   HIV (human immunodeficiency virus infection) (HCC)    Hypertension     Past Surgical History:  Procedure Laterality Date   BALLOON DILATION  09/21/2021   Procedure: BALLOON DILATION;  Surgeon: Eartha Angelia Sieving, MD;  [facility];  Service: Gastroenterology;;   BIOPSY  09/21/2021   Procedure: BIOPSY;  Surgeon: Eartha Angelia Sieving, MD;  [facility];  Service: Gastroenterology;;   COLONOSCOPY WITH PROPOFOL  N/A 09/21/2021   Procedure: COLONOSCOPY WITH PROPOFOL ;  Surgeon: Eartha Angelia Sieving, MD;  [facility];  Service: Gastroenterology;  Laterality: N/A;  11:10   ESOPHAGOGASTRODUODENOSCOPY (EGD) WITH PROPOFOL  N/A 09/21/2021   Procedure: ESOPHAGOGASTRODUODENOSCOPY (EGD) WITH PROPOFOL ;  Surgeon: Eartha Angelia Sieving, MD;  [facility];  Service: Gastroenterology;  Laterality: N/A;   FINGER SURGERY     POLYPECTOMY  09/21/2021   Procedure: POLYPECTOMY;  Surgeon: Eartha Angelia, Sieving, MD;  [facility];  Service: Gastroenterology;;    Current Outpatient Medications  Medication Sig Dispense Refill   acetaminophen  (TYLENOL ) 500 MG tablet Take 1,000 mg by mouth every 8 (eight) hours as needed for moderate pain.     albuterol  (VENTOLIN  HFA) 108 (90 Base) MCG/ACT inhaler Inhale 2 puffs into the lungs every 6 (six) hours as needed for  wheezing or shortness of breath. 8 g 6   amLODipine -olmesartan (AZOR) 5-20 MG tablet Take 1 tablet by mouth daily. (Patient taking differently: Take 1 tablet by mouth daily. 10-40 once per day per patient.)     BIKTARVY  50-200-25 MG TABS tablet TAKE 1 TABLET BY MOUTH DAILY 30 tablet 3   celecoxib (CELEBREX) 100 MG  capsule Take 100 mg by mouth 2 (two) times daily.     fenofibrate  (TRICOR ) 48 MG tablet Take 1 tablet (48 mg total) by mouth daily. 30 tablet 11   lithium  300 MG tablet Take 300 mg by mouth daily.     montelukast  (SINGULAIR ) 10 MG tablet Take 1 tablet by mouth at bedtime. (Patient taking differently: Take 1 tablet by mouth as needed.)     omeprazole  (PRILOSEC) 40 MG capsule Take 1 capsule (40 mg total) by mouth daily. . 90 capsule 3   oxymetazoline (AFRIN) 0.05 % nasal spray Place 1 spray into both nostrils 3 (three) times daily as needed for congestion.     QUEtiapine  (SEROQUEL ) 100 MG tablet Take 200 mg by mouth at bedtime.     rosuvastatin  (CRESTOR ) 10 MG tablet Take 1 tablet (10 mg total) by mouth daily. 30 tablet 11   tadalafil (CIALIS) 10 MG tablet Take 10 mg by mouth daily as needed.     No current facility-administered medications for this visit.    Allergies as of 05/25/2024 - Review Complete 05/25/2024  Allergen Reaction Noted   Levaquin [levofloxacin in d5w] Other (See Comments) 03/11/2013   Other  12/23/2019   Sulfa antibiotics Rash 01/03/2012    Social History   Socioeconomic History   Marital status: Married    Spouse name: Not on file   Number of children: Not on file   Years of education: Not on file   Highest education level: Not on file  Occupational History   Not on file  Tobacco Use   Smoking status: Every Day    Current packs/day: 0.25    Types: Cigarettes   Smokeless tobacco: Never  Vaping Use   Vaping status: Never Used  Substance and Sexual Activity   Alcohol use: Yes    Comment: occasionally   Drug use: No    Types: Cocaine, Marijuana, Other-see comments    Comment: presription pills - history but has not done any in 1 year   Sexual activity: Not Currently    Comment: declined condoms  Other Topics Concern   Not on file  Social History Narrative   Not on file   Social Drivers of Health   Financial Resource Strain: Low Risk  (03/03/2024)    [facility]   Overall Financial Resource Strain (CARDIA)    Difficulty of Paying Living Expenses: Not hard at all  Food Insecurity: No Food Insecurity (03/03/2024)   Received from [facility]   Hunger Vital Sign    Within the past 12 months, you worried that your food would run out before you got the money to buy more.: Never true    Within the past 12 months, the food you bought just didn't last and you didn't have money to get more.: Never true  Transportation Needs: No Transportation Needs (03/03/2024)   Received from [facility] - Transportation    In the past 12 months, has lack of transportation kept you from medical appointments or from getting medications?: No    Lack of Transportation (Non-Medical): No  Physical Activity: Insufficiently Active (12/28/2020)   Received from [facility]   Exercise Vital Sign    On average, how many days per week do you engage in moderate to strenuous exercise (like a brisk walk)?: 2 days    On average, how many minutes do you engage in exercise at this level?: 20 min  Stress: Stress Concern Present (12/11/2023)   Received from [facility] - Occupational Stress Questionnaire    Feeling of Stress : Rather much  Social Connections: Socially Integrated (06/06/2023)   Received from [facility]   Social Connection and Isolation Panel    In a typical week, how many times do you talk on the phone with family, friends, or neighbors?: Three times a week    How often do you get together with friends or relatives?: Three times a week    How often do you attend church or religious services?: 1 to 4 times per year    Do you belong to any clubs or organizations such as church groups, unions, fraternal or athletic groups, or school groups?: No    How often do you attend meetings of the clubs or organizations you belong to?: 1 to 4 times  per year    Are you married, widowed, divorced, separated, never married, or living with a partner?: Married    Review of systems General: negative for malaise, night sweats, fever, chills, weight los Neck: Negative for lumps, goiter, pain and significant neck swelling Resp: Negative for cough, wheezing, dyspnea at rest CV: Negative for chest pain, leg swelling, palpitations, orthopnea GI: denies melena, hematochezia, nausea, vomiting, diarrhea, constipation, odyonophagia, early satiety or unintentional weight loss. +dysphagia +heartburn MSK: Negative for joint pain or swelling, back pain, and muscle pain. Derm: Negative for itching or rash Psych: Denies depression, anxiety, memory loss, confusion. No homicidal or suicidal ideation.  Heme: Negative for prolonged bleeding, bruising easily, and swollen nodes. Endocrine: Negative for cold or heat intolerance, polyuria, polydipsia and goiter. Neuro: negative for tremor, gait imbalance, syncope and seizures. The remainder of the review of systems is noncontributory.  Physical Exam: BP 129/81 (BP Location: Left Arm, Patient Position: Sitting, Cuff Size: Normal)   Pulse 71   Temp 98.7 F (37.1 C) (Temporal)   Ht 5' 9 (1.753 m)   Wt 183 lb 14.4 oz (83.4 kg)   BMI 27.16 kg/m  General:   Alert and oriented. No distress noted. Pleasant and cooperative.  Head:  Normocephalic and atraumatic. Eyes:  Conjuctiva clear without scleral icterus. Mouth:  Oral mucosa pink and moist. Good dentition. No lesions. Heart: Normal rate and rhythm, s1 and s2 heart sounds present.  Lungs: Clear lung sounds in all lobes. Respirations equal and unlabored. Abdomen:  +BS, soft, non-tender and non-distended. No rebound or guarding. No HSM or masses noted. Derm: No palmar erythema or jaundice Msk:  Symmetrical without gross deformities. Normal posture. Extremities:  Without edema. Neurologic:  Alert and  oriented x4 Psych:  Alert and cooperative. Normal mood and  affect.  Invalid input(s): 6 MONTHS   ASSESSMENT: Ryan [name] is a 53 y.o. male presenting today for follow up of GERD and Dysphagia  GERD/dysphagia: maintained on omeprazole  40mg  daily. Reports reflux symptoms with heartburn maybe 1-2 times per week. Ongoing dysphagia over the last few months. Notably scheduled for EGD last year for the same but this procedure was not completed. He has issues mostly with drier, thicker foods, no issues  with liquids. History of Schatzki's ring, last dilated in 2022. At this time, would recommend increasing PPI to BID, and scheduling EGD +/- dilation to rule out recurrent Schatzki's ring, esophageal web, stricture, stenosis, malignancy. Consider change in PPI therapy if BID dosing is not improving his symptoms. Indications, risks and benefits of procedure discussed in detail with patient. Patient verbalized understanding and is in agreement to proceed with EGD.   PLAN:  -increase omeprazole  40mg  to BID -consider change in PPI therapy if increased dosing not controlling symptoms  -schedule EGD ASA II  -chewing precautions   All questions were answered, patient verbalized understanding and is in agreement with plan as outlined above.    Follow Up: 3 months   Edwyn Inclan L. Mariette, MSN, APRN, AGNP-C Adult-Gerontology Nurse Practitioner [facility]  I have reviewed the note and agree with the APP's assessment as described in this progress note  Toribio Fortune, MD Gastroenterology and Hepatology [facility] Gastroenterology

## 2024-05-25 NOTE — Telephone Encounter (Signed)
 [facility] : Your authorization [id number] was submitted

## 2024-05-25 NOTE — Patient Instructions (Signed)
 Please increase omeprazole  40mg  to twice daily, take one in the morning 30 minutes prior to breakfast and second dose 30 minutes prior to dinner  Continue to avoid thicker, drier foods, take small bites, chew well and take sips of liquids between bites  We will get you scheduled for upper endoscopy for further evaluation of your swallowing  Follow up 3 months  It was a pleasure to see you today. I want to create trusting relationships with patients and provide genuine, compassionate, and quality care. I truly value your feedback! please be on the lookout for a survey regarding your visit with me today. I appreciate your input about our visit and your time in completing this!    Lolah Coghlan L. Brittin Belnap, MSN, APRN, AGNP-C Adult-Gerontology Nurse Practitioner [facility] Gastroenterology at [facility]

## 2024-06-09 ENCOUNTER — Ambulatory Visit (HOSPITAL_COMMUNITY): Payer: MEDICAID | Admitting: Certified Registered Nurse Anesthetist

## 2024-06-09 ENCOUNTER — Encounter (HOSPITAL_COMMUNITY)
Admission: RE | Disposition: A | Discharge status: Home / Self Care | Payer: Self-pay | Source: Home / Self Care | Attending: Gastroenterology

## 2024-06-09 ENCOUNTER — Encounter (HOSPITAL_COMMUNITY): Payer: Self-pay | Admitting: Gastroenterology

## 2024-06-09 ENCOUNTER — Other Ambulatory Visit: Payer: Self-pay

## 2024-06-09 ENCOUNTER — Ambulatory Visit (HOSPITAL_COMMUNITY)
Admission: RE | Admit: 2024-06-09 | Discharge: 2024-06-09 | Disposition: A | Discharge status: Home / Self Care | Payer: MEDICAID | Attending: Gastroenterology | Admitting: Gastroenterology

## 2024-06-09 DIAGNOSIS — K222 Esophageal obstruction: Secondary | ICD-10-CM | POA: Insufficient documentation

## 2024-06-09 DIAGNOSIS — R1310 Dysphagia, unspecified: Secondary | ICD-10-CM | POA: Diagnosis not present

## 2024-06-09 DIAGNOSIS — F319 Bipolar disorder, unspecified: Secondary | ICD-10-CM | POA: Diagnosis not present

## 2024-06-09 DIAGNOSIS — I10 Essential (primary) hypertension: Secondary | ICD-10-CM

## 2024-06-09 DIAGNOSIS — K3189 Other diseases of stomach and duodenum: Secondary | ICD-10-CM | POA: Diagnosis present

## 2024-06-09 DIAGNOSIS — K449 Diaphragmatic hernia without obstruction or gangrene: Secondary | ICD-10-CM | POA: Diagnosis not present

## 2024-06-09 DIAGNOSIS — F419 Anxiety disorder, unspecified: Secondary | ICD-10-CM | POA: Diagnosis not present

## 2024-06-09 DIAGNOSIS — K31A12 Gastric intestinal metaplasia without dysplasia, involving the body (corpus): Secondary | ICD-10-CM | POA: Diagnosis not present

## 2024-06-09 DIAGNOSIS — Z79899 Other long term (current) drug therapy: Secondary | ICD-10-CM | POA: Diagnosis not present

## 2024-06-09 DIAGNOSIS — K209 Esophagitis, unspecified without bleeding: Secondary | ICD-10-CM | POA: Diagnosis not present

## 2024-06-09 DIAGNOSIS — K219 Gastro-esophageal reflux disease without esophagitis: Secondary | ICD-10-CM | POA: Insufficient documentation

## 2024-06-09 DIAGNOSIS — K648 Other hemorrhoids: Secondary | ICD-10-CM | POA: Insufficient documentation

## 2024-06-09 DIAGNOSIS — F17210 Nicotine dependence, cigarettes, uncomplicated: Secondary | ICD-10-CM | POA: Insufficient documentation

## 2024-06-09 DIAGNOSIS — K2950 Unspecified chronic gastritis without bleeding: Secondary | ICD-10-CM | POA: Insufficient documentation

## 2024-06-09 DIAGNOSIS — Z21 Asymptomatic human immunodeficiency virus [HIV] infection status: Secondary | ICD-10-CM | POA: Diagnosis not present

## 2024-06-09 HISTORY — PX: ESOPHAGEAL DILATION: SHX303

## 2024-06-09 HISTORY — PX: ESOPHAGOGASTRODUODENOSCOPY: SHX5428

## 2024-06-09 SURGERY — EGD (ESOPHAGOGASTRODUODENOSCOPY)
Anesthesia: General

## 2024-06-09 MED ORDER — LACTATED RINGERS IV SOLN: INTRAVENOUS | Status: DC | PRN

## 2024-06-09 MED ORDER — LIDOCAINE 2% (20 MG/ML) 5 ML SYRINGE
INTRAMUSCULAR | Status: DC | PRN
  Administered 2024-06-09: 60 mg via INTRAVENOUS

## 2024-06-09 MED ORDER — LACTATED RINGERS IV SOLN: INTRAVENOUS | Status: DC

## 2024-06-09 MED ORDER — PROPOFOL 500 MG/50ML IV EMUL
INTRAVENOUS | Status: DC | PRN
  Administered 2024-06-09: 150 ug/kg/min via INTRAVENOUS
  Administered 2024-06-09: 100 mg via INTRAVENOUS

## 2024-06-09 MED ORDER — DEXMEDETOMIDINE HCL IN NACL 80 MCG/20ML IV SOLN
INTRAVENOUS | Status: DC | PRN
  Administered 2024-06-09: 8 ug via INTRAVENOUS

## 2024-06-09 NOTE — Anesthesia Postprocedure Evaluation (Signed)
 Anesthesia Post Note  Patient: Ryan [name]  Procedure(s) Performed: EGD (ESOPHAGOGASTRODUODENOSCOPY) DILATION, ESOPHAGUS  Patient location during evaluation: Endoscopy Anesthesia Type: General Level of consciousness: awake and alert Pain management: pain level controlled Vital Signs Assessment: post-procedure vital signs reviewed and stable Respiratory status: spontaneous breathing, nonlabored ventilation and respiratory function stable Cardiovascular status: stable Anesthetic complications: no   There were no known notable events for this encounter.   Last Vitals:  Vitals:   06/09/24 0928 06/09/24 0937  BP: (!) 103/59 (!) 102/58  Pulse: 61 65  Resp: 11 16  Temp: 36.5 C   SpO2: 97% 97%    Last Pain:  Vitals:   06/09/24 0937  TempSrc:   PainSc: 0-No pain                 Alexandru Moorer L Mckinsey Keagle

## 2024-06-09 NOTE — Op Note (Signed)
 [facility] Patient Name: Ryan [name] Procedure Date: 06/09/2024 8:58 AM MRN: [id number] Date of Birth: [birth year] Attending MD: Toribio Fortune , , [id number] CSN: [id number] Age: 53 Admit Type: Outpatient Procedure:                Upper GI endoscopy Indications:              Dysphagia Providers:                Toribio Fortune, Jon LABOR. Gerome RN, RN, Bascom Blush Referring MD:              Medicines:                Monitored Anesthesia Care Complications:            No immediate complications. Estimated Blood Loss:     Estimated blood loss: none. Procedure:                Pre-Anesthesia Assessment:                           - Prior to the procedure, a History and Physical                            was performed, and patient medications, allergies                            and sensitivities were reviewed. The patient's                            tolerance of previous anesthesia was reviewed.                           - The risks and benefits of the procedure and the                            sedation options and risks were discussed with the                            patient. All questions were answered and informed                            consent was obtained.                           - ASA Grade Assessment: II - A patient with mild                            systemic disease.                           After obtaining informed consent, the endoscope was                            passed under direct vision. Throughout the  procedure, the patient's blood pressure, pulse, and                            oxygen saturations were monitored continuously. The                            [id number] ([id number]) Upper was introduced through                            the mouth, and advanced to the second part of                            duodenum. The upper GI endoscopy was accomplished                            without  difficulty. The patient tolerated the                            procedure well. Scope In: 9:18:14 AM Scope Out: 9:25:39 AM Total Procedure Duration: 0 hours 7 minutes 25 seconds  Findings:      LA Grade B (one or more mucosal breaks greater than 5 mm, not extending       between the tops of two mucosal folds) esophagitis with no bleeding was       found in the lower third of the esophagus.      A mild Schatzki ring was found at the gastroesophageal junction. A cold       forceps was used to disrupt the rim of the ring. A TTS dilator was       passed through the scope. Dilation with a 15-16.5-18 mm balloon dilator       was performed to 18 mm.      A few localized small erosions with no stigmata of recent bleeding were       found in the gastric body. Biopsies were taken with a cold forceps for       Helicobacter pylori testing.      Patchy mildly erythematous mucosa without active bleeding and with no       stigmata of bleeding was found in the duodenal bulb. Impression:               - LA Grade B esophagitis with no bleeding.                           - Mild Schatzki ring. Dilated.                           - Erosive gastropathy with no stigmata of recent                            bleeding. Biopsied.                           - Erythematous duodenopathy. Moderate Sedation:      Per Anesthesia Care Recommendation:           - Discharge patient to home (ambulatory).                           -  Resume previous diet.                           - Await pathology results.                           - Continue present medications.                           - No ibuprofen , Celebrex, naproxen, or other                            non-steroidal anti-inflammatory drugs. Procedure Code(s):        --- Professional ---                           [phone], Esophagogastroduodenoscopy, flexible,                            transoral; with transendoscopic balloon dilation of                             esophagus (less than 30 mm diameter)                           43239, 59, Esophagogastroduodenoscopy, flexible,                            transoral; with biopsy, single or multiple Diagnosis Code(s):        --- Professional ---                           K20.90, Esophagitis, unspecified without bleeding                           K22.2, Esophageal obstruction                           K31.89, Other diseases of stomach and duodenum                           R13.10, Dysphagia, unspecified CPT copyright 2022 American Medical Association. All rights reserved. The codes documented in this report are preliminary and upon coder review may  be revised to meet current compliance requirements. Toribio Fortune, MD Toribio Fortune,  06/09/2024 9:32:56 AM This report has been signed electronically. Number of Addenda: 0

## 2024-06-09 NOTE — Anesthesia Preprocedure Evaluation (Signed)
 Anesthesia Evaluation  Patient identified by MRN, date of birth, ID band Patient awake    Reviewed: Allergy & Precautions, NPO status , Patient's Chart, lab work & pertinent test results  Airway Mallampati: II  TM Distance: >3 FB Neck ROM: Full    Dental no notable dental hx. (+) Dental Advisory Given, Teeth Intact   Pulmonary pneumonia, Current Smoker and Patient abstained from smoking.   Pulmonary exam normal breath sounds clear to auscultation       Cardiovascular hypertension, Pt. on medications Normal cardiovascular exam Rhythm:Regular Rate:Normal     Neuro/Psych  PSYCHIATRIC DISORDERS Anxiety Depression Bipolar Disorder   negative neurological ROS     GI/Hepatic ,GERD  Medicated,,(+)     substance abuse  cocaine use and marijuana use  Endo/Other  negative endocrine ROS    Renal/GU Renal InsufficiencyRenal disease  negative genitourinary   Musculoskeletal negative musculoskeletal ROS (+)    Abdominal   Peds negative pediatric ROS (+)  Hematology  (+) Blood dyscrasia, anemia , HIV  Anesthesia Other Findings   Reproductive/Obstetrics negative OB ROS                              Anesthesia Physical Anesthesia Plan  ASA: 3  Anesthesia Plan: General   Post-op Pain Management: Minimal or no pain anticipated   Induction:   PONV Risk Score and Plan: Propofol  infusion  Airway Management Planned: Nasal Cannula and Natural Airway  Additional Equipment: None  Intra-op Plan:   Post-operative Plan:   Informed Consent: I have reviewed the patients History and Physical, chart, labs and discussed the procedure including the risks, benefits and alternatives for the proposed anesthesia with the patient or authorized representative who has indicated his/her understanding and acceptance.     Dental advisory given  Plan Discussed with: CRNA  Anesthesia Plan Comments:           Anesthesia Quick Evaluation

## 2024-06-09 NOTE — Discharge Instructions (Addendum)
 You are being discharged to home.  Resume your previous diet.  We are waiting for your pathology results.  Continue your present medications.  Do not take any ibuprofen  (including Advil , Motrin  or Nuprin ), Celebrex, naproxen, or other non-steroidal anti-inflammatory drugs.

## 2024-06-09 NOTE — Transfer of Care (Signed)
 Immediate Anesthesia Transfer of Care Note  Patient: Ryan [name]  Procedure(s) Performed: EGD (ESOPHAGOGASTRODUODENOSCOPY) DILATION, ESOPHAGUS  Patient Location: Endoscopy Unit  Anesthesia Type:General  Level of Consciousness: drowsy  Airway & Oxygen Therapy: Patient Spontanous Breathing  Post-op Assessment: Report given to RN and Post -op Vital signs reviewed and stable  Post vital signs: Reviewed and stable  Last Vitals:  Vitals Value Taken Time  BP 103/59 06/09/24 09:28  Temp 36.5 C 06/09/24 09:28  Pulse 61 06/09/24 09:28  Resp 11 06/09/24 09:28  SpO2 97 % 06/09/24 09:28    Last Pain:  Vitals:   06/09/24 0928  TempSrc: Oral  PainSc:       Patients Stated Pain Goal: 2 (06/09/24 0851)  Complications: No notable events documented.

## 2024-06-09 NOTE — Interval H&P Note (Signed)
 History and Physical Interval Note:  06/09/2024 8:47 AM  Ryan [name]  has presented today for surgery, with the diagnosis of dysphagia.  The various methods of treatment have been discussed with the patient and family. After consideration of risks, benefits and other options for treatment, the patient has consented to  Procedure(s) with comments: EGD (ESOPHAGOGASTRODUODENOSCOPY) (N/A) - 10:30 am, asa 2 DILATION, ESOPHAGUS (N/A) as a surgical intervention.  The patient's history has been reviewed, patient examined, no change in status, stable for surgery.  I have reviewed the patient's chart and labs.  Questions were answered to the patient's satisfaction.     [name]

## 2024-06-10 ENCOUNTER — Ambulatory Visit (INDEPENDENT_AMBULATORY_CARE_PROVIDER_SITE_OTHER): Payer: Self-pay | Admitting: Gastroenterology

## 2024-06-10 LAB — SURGICAL PATHOLOGY

## 2024-06-14 ENCOUNTER — Encounter (INDEPENDENT_AMBULATORY_CARE_PROVIDER_SITE_OTHER): Payer: Self-pay | Admitting: *Deleted

## 2024-06-14 ENCOUNTER — Encounter (HOSPITAL_COMMUNITY): Payer: Self-pay | Admitting: Gastroenterology

## 2024-06-14 NOTE — Progress Notes (Signed)
 3 yr EGD noted in recall Patient result letter mailed procedure note and pathology result faxed to PCP

## 2024-06-17 ENCOUNTER — Other Ambulatory Visit: Payer: Self-pay | Admitting: Pharmacist

## 2024-06-17 ENCOUNTER — Other Ambulatory Visit (HOSPITAL_COMMUNITY): Payer: Self-pay

## 2024-06-17 DIAGNOSIS — B20 Human immunodeficiency virus [HIV] disease: Secondary | ICD-10-CM

## 2024-06-17 MED ORDER — BIKTARVY 50-200-25 MG PO TABS: 1.0000 | ORAL_TABLET | Freq: Every day | ORAL | 3 refills | Status: DC

## 2024-06-17 NOTE — Progress Notes (Signed)
 Per patient preference  Alan Geralds, PharmD, CPP, BCIDP, AAHIVP Clinical Pharmacist Practitioner Infectious Diseases Clinical Pharmacist [facility]

## 2024-07-21 ENCOUNTER — Encounter (INDEPENDENT_AMBULATORY_CARE_PROVIDER_SITE_OTHER): Payer: Self-pay | Admitting: Gastroenterology

## 2024-07-26 ENCOUNTER — Ambulatory Visit: Payer: MEDICAID | Admitting: Internal Medicine

## 2024-08-02 ENCOUNTER — Ambulatory Visit: Payer: MEDICAID | Admitting: Internal Medicine

## 2024-08-05 ENCOUNTER — Encounter (INDEPENDENT_AMBULATORY_CARE_PROVIDER_SITE_OTHER): Payer: Self-pay | Admitting: Gastroenterology

## 2024-08-19 ENCOUNTER — Other Ambulatory Visit: Payer: Self-pay

## 2024-08-19 ENCOUNTER — Ambulatory Visit: Payer: MEDICAID | Admitting: Internal Medicine

## 2024-08-19 ENCOUNTER — Encounter: Payer: Self-pay | Admitting: Internal Medicine

## 2024-08-19 VITALS — BP 134/83 | HR 86 | Temp 99.2°F | Ht 69.0 in | Wt 188.0 lb

## 2024-08-19 DIAGNOSIS — F319 Bipolar disorder, unspecified: Secondary | ICD-10-CM | POA: Diagnosis not present

## 2024-08-19 DIAGNOSIS — Z79899 Other long term (current) drug therapy: Secondary | ICD-10-CM

## 2024-08-19 DIAGNOSIS — Z23 Encounter for immunization: Secondary | ICD-10-CM

## 2024-08-19 DIAGNOSIS — B20 Human immunodeficiency virus [HIV] disease: Secondary | ICD-10-CM | POA: Diagnosis not present

## 2024-08-19 DIAGNOSIS — E782 Mixed hyperlipidemia: Secondary | ICD-10-CM | POA: Diagnosis not present

## 2024-08-19 MED ORDER — BIKTARVY 50-200-25 MG PO TABS: 1.0000 | ORAL_TABLET | Freq: Every day | ORAL | 11 refills | Status: AC

## 2024-08-19 NOTE — Progress Notes (Signed)
 RFV: follow up for hiv disease  Patient ID: Ryan [name], male   DOB: 10-10-1970, 53 y.o.   MRN: [id number]  HPI Ryan [name]  is a 53yo M with HIV disease, HTN, bipolar disease, on biktarvy . He is starting to see Dr lady patel in [place] for rheumatologist--undifferentiated inflammatory arthritis of right hand. 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 Very busy of late.  Sochx: mom died in 26-Dec-2023  Outpatient Encounter Medications as of 08/19/2024  Medication Sig   albuterol  (VENTOLIN  HFA) 108 (90 Base) MCG/ACT inhaler Inhale 2 puffs into the lungs every 6 (six) hours as needed for wheezing or shortness of breath.   amLODipine -olmesartan (AZOR) 5-20 MG tablet Take 1 tablet by mouth daily. (Patient taking differently: Take 1 tablet by mouth daily. 10-40 once per day per patient.)   bictegravir-emtricitabine -tenofovir  AF (BIKTARVY ) 50-200-25 MG TABS tablet Take 1 tablet by mouth daily.   fenofibrate  (TRICOR ) 48 MG tablet Take 1 tablet (48 mg total) by mouth daily.   lithium  300 MG tablet Take 300 mg by mouth daily.   montelukast  (SINGULAIR ) 10 MG tablet Take 1 tablet by mouth at bedtime. (Patient taking differently: Take 1 tablet by mouth as needed.)   omeprazole  (PRILOSEC) 40 MG capsule Take 1 capsule (40 mg total) by mouth 2 (two) times daily. SABRA   oxymetazoline (AFRIN) 0.05 % nasal spray Place 1 spray into both nostrils 3 (three) times daily as needed for congestion.   QUEtiapine  (SEROQUEL ) 100 MG tablet Take 200 mg by mouth at bedtime.   rosuvastatin  (CRESTOR ) 10 MG tablet Take 1 tablet (10 mg total) by mouth daily.   tadalafil (CIALIS) 10 MG tablet Take 10 mg by mouth daily as needed.   acetaminophen  (TYLENOL ) 500 MG tablet Take 1,000 mg by mouth every 8 (eight) hours as needed for moderate pain.   No facility-administered encounter medications on file as of 08/19/2024.     Patient Active Problem List   Diagnosis Date Noted   Schatzki's ring 05/26/2023   Dysphagia  09/13/2021   Bloating 09/13/2021   Homelessness 06/27/2013   Weakness generalized 06/27/2013   Abnormal TSH 06/27/2013   Anemia 06/27/2013   Fever and chills 06/25/2013   PNA (pneumonia) 06/25/2013   Anxiety 06/10/2013   H/O: substance abuse (HCC) 06/09/2013   Malnutrition of moderate degree 05/21/2013   Human immunodeficiency virus (HIV) disease (HCC) 05/21/2013   Orthostatic hypotension 05/20/2013   Chest pain on exertion 05/20/2013   Weight loss 05/20/2013   Bipolar disorder (HCC) 05/20/2013   GERD (gastroesophageal reflux disease) 05/20/2013   Hypotension 01/03/2012   ARF (acute renal failure) 01/03/2012   Rash 01/03/2012   HTN (hypertension) 01/03/2012     Health Maintenance Due  Topic Date Due   Zoster Vaccines- Shingrix (1 of 2) Never done   Pneumococcal Vaccine: 50+ Years (3 of 3 - PCV20 or PCV21) 02/09/2019   Influenza Vaccine  05/07/2024   COVID-19 Vaccine (5 - 2025-26 season) 06/07/2024     Review of Systems + arthritis. Physical Exam   BP 134/83   Pulse 86   Temp 99.2 F (37.3 C) (Oral)   Ht 5' 9 (1.753 m)   Wt 188 lb (85.3 kg)   SpO2 96%   BMI 27.76 kg/m   Physical Exam  Constitutional:  oriented to person, place, and time. appears well-developed and well-nourished. No distress.  HENT: [place]/AT, PERRLA, no scleral icterus Mouth/Throat: Oropharynx is clear and moist. No oropharyngeal exudate.  Cardiovascular: Normal rate, regular  rhythm and normal heart sounds. Exam reveals no gallop and no friction rub.  No murmur heard.  Pulmonary/Chest: Effort normal and breath sounds normal. No respiratory distress.  has no wheezes.  Neck = supple, no nuchal rigidity Abdominal: Soft. Bowel sounds are normal.  exhibits no distension. There is no tenderness.  Lymphadenopathy: no cervical adenopathy. No axillary adenopathy Neurological: alert and oriented to person, place, and time.  Skin: Skin is warm and dry. No rash noted. No erythema.  Psychiatric: a normal mood  and affect.  behavior is normal.   Lab Results  Component Value Date   CD4TCELL 36 04/14/2023   Lab Results  Component Value Date   CD4TABS 307 (L) 01/13/2023   CD4TABS 291 (L) 10/09/2022   CD4TABS 153 (L) 08/27/2022   Lab Results  Component Value Date   HIV1RNAQUANT Not Detected 04/14/2023   Lab Results  Component Value Date   HEPBSAB NEG 06/03/2013   Lab Results  Component Value Date   LABRPR NON-REACTIVE 01/13/2023    CBC Lab Results  Component Value Date   WBC 8.1 01/13/2023   RBC 4.60 01/13/2023   HGB 15.2 01/13/2023   HCT 44.1 01/13/2023   PLT 249 01/13/2023   MCV 95.9 01/13/2023   MCH 33.0 01/13/2023   MCHC 34.5 01/13/2023   RDW 13.0 01/13/2023   LYMPHSABS 1,320 01/13/2023   MONOABS 516 05/14/2017   EOSABS 275 01/13/2023    BMET Lab Results  Component Value Date   NA 137 04/14/2023   K 4.1 04/14/2023   CL 104 04/14/2023   CO2 28 04/14/2023   GLUCOSE 86 04/14/2023   BUN 11 04/14/2023   CREATININE 1.08 04/14/2023   CALCIUM  9.4 04/14/2023   GFRNONAA 69 11/27/2020   GFRAA 80 11/27/2020      Assessment and Plan  Health maintenance = Flu and pneumonia vaccine today  Hiv disease - will give biktarvy  refill, and repeat labs- Cd 4 count and VL to ensure he is undetectable  Long term medication management = will check cr  Hld = need lipid profile.   Arthritis = will reach out to dr patel to see what biologics that he maybe starting  Bipolar disorder= he has psychiatrist through [place];  referral in [place] for psychiatry; now on lithium  and quietiapine. Queitiapine 200mg  makes him groggy. But 100mg  not enough sleep.  Rtc 3 months

## 2024-08-20 LAB — T-HELPER CELL (CD4) - (RCID CLINIC ONLY)
CD4 % Helper T Cell: 32 % — ABNORMAL LOW (ref 33–65)
CD4 T Cell Abs: 575 /uL (ref 400–1790)

## 2024-08-21 LAB — LIPID PANEL
Cholesterol: 141 mg/dL (ref <200)
HDL: 71 mg/dL (ref >=40)
LDL Cholesterol (Calc): 42 mg/dL
Non-HDL Cholesterol (Calc): 70 mg/dL (ref <130)
Total CHOL/HDL Ratio: 2 (calc) (ref <5.0)
Triglycerides: 225 mg/dL — ABNORMAL HIGH (ref <150)

## 2024-08-21 LAB — CBC WITH DIFFERENTIAL/PLATELET
Absolute Lymphocytes: 1949 {cells}/uL (ref 850–3900)
Absolute Monocytes: 707 {cells}/uL (ref 200–950)
Basophils Absolute: 111 {cells}/uL (ref 0–200)
Basophils Relative: 1.1 %
Eosinophils Absolute: 283 {cells}/uL (ref 15–500)
Eosinophils Relative: 2.8 %
HCT: 44.3 % (ref 38.5–50.0)
Hemoglobin: 14.7 g/dL (ref 13.2–17.1)
MCH: 33 pg (ref 27.0–33.0)
MCHC: 33.2 g/dL (ref 32.0–36.0)
MCV: 99.6 fL (ref 80.0–100.0)
MPV: 10.5 fL (ref 7.5–12.5)
Monocytes Relative: 7 %
Neutro Abs: 7050 {cells}/uL (ref 1500–7800)
Neutrophils Relative %: 69.8 %
Platelets: 232 Thousand/uL (ref 140–400)
RBC: 4.45 Million/uL (ref 4.20–5.80)
RDW: 13.5 % (ref 11.0–15.0)
Total Lymphocyte: 19.3 %
WBC: 10.1 Thousand/uL (ref 3.8–10.8)

## 2024-08-21 LAB — COMPLETE METABOLIC PANEL WITHOUT GFR
AG Ratio: 1.8 (calc) (ref 1.0–2.5)
ALT: 14 U/L (ref 9–46)
AST: 15 U/L (ref 10–35)
Albumin: 4.2 g/dL (ref 3.6–5.1)
Alkaline phosphatase (APISO): 37 U/L (ref 35–144)
BUN: 20 mg/dL (ref 7–25)
CO2: 24 mmol/L (ref 20–32)
Calcium: 9 mg/dL (ref 8.6–10.3)
Chloride: 104 mmol/L (ref 98–110)
Creat: 1.23 mg/dL (ref 0.70–1.30)
Globulin: 2.3 g/dL (ref 1.9–3.7)
Glucose, Bld: 82 mg/dL (ref 65–99)
Potassium: 4.2 mmol/L (ref 3.5–5.3)
Sodium: 137 mmol/L (ref 135–146)
Total Bilirubin: 0.5 mg/dL (ref 0.2–1.2)
Total Protein: 6.5 g/dL (ref 6.1–8.1)

## 2024-08-21 LAB — HIV-1 RNA QUANT-NO REFLEX-BLD
HIV 1 RNA Quant: NOT DETECTED {copies}/mL
HIV-1 RNA Quant, Log: NOT DETECTED {Log_copies}/mL

## 2024-08-21 LAB — RPR: RPR Ser Ql: NONREACTIVE

## 2024-11-06 ENCOUNTER — Other Ambulatory Visit (INDEPENDENT_AMBULATORY_CARE_PROVIDER_SITE_OTHER): Payer: Self-pay | Admitting: Gastroenterology

## 2024-11-06 DIAGNOSIS — K2100 Gastro-esophageal reflux disease with esophagitis, without bleeding: Secondary | ICD-10-CM

## 2024-11-18 ENCOUNTER — Ambulatory Visit: Payer: MEDICAID | Admitting: Internal Medicine
